# Patient Record
Sex: Male | Born: 1945 | Race: White | Hispanic: No | Smoking: Current every day smoker
Health system: Southern US, Community
[De-identification: ages and names within clinical notes are randomized; demographics above are authoritative.]

## PROBLEM LIST (undated history)

## (undated) ENCOUNTER — Emergency Department (HOSPITAL_BASED_OUTPATIENT_CLINIC_OR_DEPARTMENT_OTHER)

## (undated) DIAGNOSIS — R7303 Prediabetes: Secondary | ICD-10-CM

## (undated) DIAGNOSIS — M1990 Unspecified osteoarthritis, unspecified site: Secondary | ICD-10-CM

## (undated) DIAGNOSIS — G47419 Narcolepsy without cataplexy: Secondary | ICD-10-CM

## (undated) DIAGNOSIS — G47411 Narcolepsy with cataplexy: Secondary | ICD-10-CM

## (undated) DIAGNOSIS — I10 Essential (primary) hypertension: Secondary | ICD-10-CM

## (undated) HISTORY — PX: MOUTH SURGERY: SHX715

---

## 2017-01-06 DIAGNOSIS — I10 Essential (primary) hypertension: Secondary | ICD-10-CM | POA: Diagnosis present

## 2017-01-06 DIAGNOSIS — K219 Gastro-esophageal reflux disease without esophagitis: Secondary | ICD-10-CM | POA: Diagnosis present

## 2017-01-06 DIAGNOSIS — E782 Mixed hyperlipidemia: Secondary | ICD-10-CM | POA: Diagnosis present

## 2017-01-06 DIAGNOSIS — G47411 Narcolepsy with cataplexy: Secondary | ICD-10-CM | POA: Diagnosis present

## 2017-01-12 DIAGNOSIS — J432 Centrilobular emphysema: Secondary | ICD-10-CM | POA: Diagnosis present

## 2017-07-06 ENCOUNTER — Encounter: Payer: Self-pay | Admitting: Neurology

## 2017-12-30 ENCOUNTER — Other Ambulatory Visit (HOSPITAL_COMMUNITY): Payer: Self-pay

## 2018-01-11 NOTE — H&P (Signed)
TOTAL KNEE ADMISSION H&P  Patient is being admitted for left total knee arthroplasty.  Subjective:  Chief Complaint:   Left knee primary OA / pain  HPI: Patrick Lam, 72 y.o. male, has a history of pain and functional disability in the left knee due to arthritis and has failed non-surgical conservative treatments for greater than 12 weeks to include NSAID's and/or analgesics, corticosteriod injections, viscosupplementation injections and activity modification.  Onset of symptoms was gradual, starting 3-4 years ago with gradually worsening course since that time. The patient noted no past surgery on the left knee(s).  Patient currently rates pain in the left knee(s) at 6 out of 10 with activity. Patient has night pain, worsening of pain with activity and weight bearing, pain that interferes with activities of daily living, pain with passive range of motion, crepitus and joint swelling.  Patient has evidence of periarticular osteophytes and joint space narrowing by imaging studies. There is no active infection.  Risks, benefits and expectations were discussed with the patient.  Risks including but not limited to the risk of anesthesia, blood clots, nerve damage, blood vessel damage, failure of the prosthesis, infection and up to and including death.  Patient understand the risks, benefits and expectations and wishes to proceed with surgery.   PCP: Herma Carson, MD  D/C Plans:       Home   Post-op Meds:       No Rx given   Tranexamic Acid:      To be given - IV   Decadron:      Is to be given  FYI:      ASA  Norco  DME:   Pt already has equipment  PT:   [facility]   Past Medical History:  Diagnosis Date  . Arthritis   . Hypertension   . Narcolepsy and cataplexy   . Pre-diabetes     Past Surgical History:  Procedure Laterality Date  . MOUTH SURGERY      No current facility-administered medications for this encounter.    Current Outpatient Medications  Medication Sig Dispense  Refill Last Dose  . clomiPRAMINE (ANAFRANIL) 25 MG capsule Take 25 mg by mouth 3 (three) times daily.      Marland Kitchen diltiazem (DILACOR XR) 240 MG 24 hr capsule Take 240 mg by mouth daily.     . hydrochlorothiazide (HYDRODIURIL) 25 MG tablet Take 25 mg by mouth daily.     Marland Kitchen losartan (COZAAR) 25 MG tablet Take 25 mg by mouth daily.     . metFORMIN (GLUCOPHAGE-XR) 500 MG 24 hr tablet Take 500 mg by mouth daily with breakfast.     . methylphenidate (RITALIN) 20 MG tablet Take 20 mg by mouth daily as needed (narcolepsy).     Marland Kitchen omeprazole (PRILOSEC) 40 MG capsule Take 40 mg by mouth daily.     . rosuvastatin (CRESTOR) 10 MG tablet Take 10 mg by mouth daily.     . Sodium Oxybate (XYREM) 500 MG/ML SOLN Take 4.5 g by mouth 2 (two) times daily. During the night      No Known Allergies   Social History   Tobacco Use  . Smoking status: Current Every Day Smoker    Packs/day: 0.00    Years: 15.00    Pack years: 0.00    Types: Cigars  . Smokeless tobacco: Never Used  . Tobacco comment: 2 cigars a day  Substance Use Topics  . Alcohol use: Never    Frequency: Never  Review of Systems  Constitutional: Negative.   HENT: Negative.   Eyes: Negative.   Respiratory: Negative.   Cardiovascular: Negative.   Gastrointestinal: Positive for heartburn.  Genitourinary: Positive for frequency.  Musculoskeletal: Positive for joint pain.  Skin: Negative.   Neurological: Negative.   Endo/Heme/Allergies: Negative.   Psychiatric/Behavioral: Negative.     Objective:  Physical Exam  Constitutional: He is oriented to person, place, and time. He appears well-developed.  HENT:  Head: Normocephalic.  Eyes: Pupils are equal, round, and reactive to light.  Neck: Neck supple. No JVD present. No tracheal deviation present. No thyromegaly present.  Cardiovascular: Normal rate, regular rhythm and intact distal pulses.  Respiratory: Effort normal and breath sounds normal. No respiratory distress. He has no  wheezes.  GI: Soft. There is no tenderness. There is no guarding.  Musculoskeletal:       Left knee: He exhibits decreased range of motion, swelling and bony tenderness. He exhibits no ecchymosis, no deformity, no laceration and no erythema. Tenderness found.  Lymphadenopathy:    He has no cervical adenopathy.  Neurological: He is alert and oriented to person, place, and time.  Skin: Skin is warm and dry.  Psychiatric: He has a normal mood and affect.      Imaging Review Plain radiographs demonstrate severe degenerative joint disease of the left knee.  The bone quality appears to be good for age and reported activity level.   Preoperative templating of the joint replacement has been completed, documented, and submitted to the Operating Room personnel in order to optimize intra-operative equipment management.   Anticipated LOS equal to or greater than 2 midnights due to - Age 74 and older with one or more of the following:  - Obesity  - Expected need for hospital services (PT, OT, Nursing) required for safe  discharge  - Active co-morbidities: narcolepsy    Assessment/Plan:  End stage arthritis, left knee   The patient history, physical examination, clinical judgment of the provider and imaging studies are consistent with end stage degenerative joint disease of the left knee(s) and total knee arthroplasty is deemed medically necessary. The treatment options including medical management, injection therapy arthroscopy and arthroplasty were discussed at length. The risks and benefits of total knee arthroplasty were presented and reviewed. The risks due to aseptic loosening, infection, stiffness, patella tracking problems, thromboembolic complications and other imponderables were discussed. The patient acknowledged the explanation, agreed to proceed with the plan and consent was signed. Patient is being admitted for inpatient treatment for surgery, pain control, PT, OT, prophylactic  antibiotics, VTE prophylaxis, progressive ambulation and ADL's and discharge planning. The patient is planning to be discharged home.   Anastasio AuerbachMatthew S. Sarh Kirschenbaum   PA-C  01/27/2018, 9:35 AM

## 2018-01-13 NOTE — Patient Instructions (Addendum)
Stokes Rattigan  01/13/2018   Your procedure is scheduled on: Tuesday 02/02/2018  Report to [facility] Main  Entrance              Report to admitting at  1150 AM    Call this number if you have problems the morning of surgery [phone]    Remember: Do not eat food :After Midnight.May have clear liquids from midnight up until 0820 am then nothing until after surgery!     CLEAR LIQUID DIET   Foods Allowed                                                                     Foods Excluded  Coffee and tea, regular and decaf                             liquids that you cannot  Plain Jell-O in any flavor                                             see through such as: Fruit ices (not with fruit pulp)                                     milk, soups, orange juice  Iced Popsicles                                    All solid food Carbonated beverages, regular and diet                                    Cranberry, grape and apple juices Sports drinks like Gatorade Lightly seasoned clear broth or consume(fat free) Sugar, honey syrup  Sample Menu Breakfast                                Lunch                                     Supper Cranberry juice                    Beef broth                            Chicken broth Jell-O                                     Grape juice  Apple juice Coffee or tea                        Jell-O                                      Popsicle                                                Coffee or tea                        Coffee or tea  _____________________________________________________________________               BRUSH YOUR TEETH MORNING OF SURGERY AND RINSE YOUR MOUTH OUT, NO CHEWING GUM CANDY OR MINTS.  How to Manage Your Diabetes Before and After Surgery  Why is it important to control my blood sugar before and after surgery? . Improving blood sugar levels before and after surgery helps  healing and can limit problems. . A way of improving blood sugar control is eating a healthy diet by: o  Eating less sugar and carbohydrates o  Increasing activity/exercise o  Talking with your doctor about reaching your blood sugar goals . High blood sugars (greater than 180 mg/dL) can raise your risk of infections and slow your recovery, so you will need to focus on controlling your diabetes during the weeks before surgery. . Make sure that the doctor who takes care of your diabetes knows about your planned surgery including the date and location.  How do I manage my blood sugar before surgery? . Check your blood sugar at least 4 times a day, starting 2 days before surgery, to make sure that the level is not too high or low. o Check your blood sugar the morning of your surgery when you wake up and every 2 hours until you get to the Short Stay unit. . If your blood sugar is less than 70 mg/dL, you will need to treat for low blood sugar: o Do not take insulin. o Treat a low blood sugar (less than 70 mg/dL) with  cup of clear juice (cranberry or apple), 4 glucose tablets, OR glucose gel. o Recheck blood sugar in 15 minutes after treatment (to make sure it is greater than 70 mg/dL). If your blood sugar is not greater than 70 mg/dL on recheck, call [phone] for further instructions. . Report your blood sugar to the short stay nurse when you get to Short Stay.  . If you are admitted to the hospital after surgery: o Your blood sugar will be checked by the staff and you will probably be given insulin after surgery (instead of oral diabetes medicines) to make sure you have good blood sugar levels. o The goal for blood sugar control after surgery is 80-180 mg/dL.   WHAT DO I DO ABOUT MY DIABETES MEDICATION?       The day before surgery, take Metformin as usual.  . Do not take oral diabetes medicines (pills) the morning of surgery.        Take these medicines the morning of surgery with A  SIP OF WATER: Diltiazem (Dilacor XR), Omeprazole (Prilosec), Rosuvastatin (Crestor), Clomipramine (Anaframil)  You may not have any metal on your body including hair pins and              piercings  Do not wear jewelry, make-up, lotions, powders or perfumes, deodorant                      Men may shave face and neck.   Do not bring valuables to the hospital.  IS NOT             RESPONSIBLE   FOR VALUABLES.  Contacts, dentures or bridgework may not be worn into surgery.  Leave suitcase in the car. After surgery it may be brought to your room.                  Please read over the following fact sheets you were given: _____________________________________________________________________             [facility] - Preparing for Surgery Before surgery, you can play an important role.  Because skin is not sterile, your skin needs to be as free of germs as possible.  You can reduce the number of germs on your skin by washing with CHG (chlorahexidine gluconate) soap before surgery.  CHG is an antiseptic cleaner which kills germs and bonds with the skin to continue killing germs even after washing. Please DO NOT use if you have an allergy to CHG or antibacterial soaps.  If your skin becomes reddened/irritated stop using the CHG and inform your nurse when you arrive at Short Stay. Do not shave (including legs and underarms) for at least 48 hours prior to the first CHG shower.  You may shave your face/neck. Please follow these instructions carefully:  1.  Shower with CHG Soap the night before surgery and the  morning of Surgery.  2.  If you choose to wash your hair, wash your hair first as usual with your  normal  shampoo.  3.  After you shampoo, rinse your hair and body thoroughly to remove the  shampoo.                           4.  Use CHG as you would any other liquid soap.  You can apply chg directly  to the skin and wash                       Gently  with a scrungie or clean washcloth.  5.  Apply the CHG Soap to your body ONLY FROM THE NECK DOWN.   Do not use on face/ open                           Wound or open sores. Avoid contact with eyes, ears mouth and genitals (private parts).                       Wash face,  Genitals (private parts) with your normal soap.             6.  Wash thoroughly, paying special attention to the area where your surgery  will be performed.  7.  Thoroughly rinse your body with warm water from the neck down.  8.  DO NOT shower/wash with your normal soap after using and rinsing off  the CHG Soap.  9.  Pat yourself dry with a clean towel.            10.  Wear clean pajamas.            11.  Place clean sheets on your bed the night of your first shower and do not  sleep with pets. Day of Surgery : Do not apply any lotions/deodorants the morning of surgery.  Please wear clean clothes to the hospital/surgery center.  FAILURE TO FOLLOW THESE INSTRUCTIONS MAY RESULT IN THE CANCELLATION OF YOUR SURGERY PATIENT SIGNATURE_________________________________  NURSE SIGNATURE__________________________________  ________________________________________________________________________   Adam Phenix  An incentive spirometer is a tool that can help keep your lungs clear and active. This tool measures how well you are filling your lungs with each breath. Taking long deep breaths may help reverse or decrease the chance of developing breathing (pulmonary) problems (especially infection) following:  A long period of time when you are unable to move or be active. BEFORE THE PROCEDURE   If the spirometer includes an indicator to show your best effort, your nurse or respiratory therapist will set it to a desired goal.  If possible, sit up straight or lean slightly forward. Try not to slouch.  Hold the incentive spirometer in an upright position. INSTRUCTIONS FOR USE  1. Sit on the edge of your bed if  possible, or sit up as far as you can in bed or on a chair. 2. Hold the incentive spirometer in an upright position. 3. Breathe out normally. 4. Place the mouthpiece in your mouth and seal your lips tightly around it. 5. Breathe in slowly and as deeply as possible, raising the piston or the ball toward the top of the column. 6. Hold your breath for 3-5 seconds or for as long as possible. Allow the piston or ball to fall to the bottom of the column. 7. Remove the mouthpiece from your mouth and breathe out normally. 8. Rest for a few seconds and repeat Steps 1 through 7 at least 10 times every 1-2 hours when you are awake. Take your time and take a few normal breaths between deep breaths. 9. The spirometer may include an indicator to show your best effort. Use the indicator as a goal to work toward during each repetition. 10. After each set of 10 deep breaths, practice coughing to be sure your lungs are clear. If you have an incision (the cut made at the time of surgery), support your incision when coughing by placing a pillow or rolled up towels firmly against it. Once you are able to get out of bed, walk around indoors and cough well. You may stop using the incentive spirometer when instructed by your caregiver.  RISKS AND COMPLICATIONS  Take your time so you do not get dizzy or light-headed.  If you are in pain, you may need to take or ask for pain medication before doing incentive spirometry. It is harder to take a deep breath if you are having pain. AFTER USE  Rest and breathe slowly and easily.  It can be helpful to keep track of a log of your progress. Your caregiver can provide you with a simple table to help with this. If you are using the spirometer at home, follow these instructions: [place] IF:   You are having difficultly using the spirometer.  You have trouble using the spirometer as often as instructed.  Your pain medication is not giving enough relief while using  the spirometer.  You  develop fever of 100.5 F (38.1 C) or higher. SEEK IMMEDIATE MEDICAL CARE IF:   You cough up bloody sputum that had not been present before.  You develop fever of 102 F (38.9 C) or greater.  You develop worsening pain at or near the incision site. MAKE SURE YOU:   Understand these instructions.  Will watch your condition.  Will get help right away if you are not doing well or get worse. Document Released: 06/16/2006 Document Revised: 04/28/2011 Document Reviewed: 08/17/2006 [employer] Patient Information 2014 [employer], [place].   ________________________________________________________________________  WHAT IS A BLOOD TRANSFUSION? Blood Transfusion Information  A transfusion is the replacement of blood or some of its parts. Blood is made up of multiple cells which provide different functions.  Red blood cells carry oxygen and are used for blood loss replacement.  White blood cells fight against infection.  Platelets control bleeding.  Plasma helps clot blood.  Other blood products are available for specialized needs, such as hemophilia or other clotting disorders. BEFORE THE TRANSFUSION  Who gives blood for transfusions?   Healthy volunteers who are fully evaluated to make sure their blood is safe. This is blood bank blood. Transfusion therapy is the safest it has ever been in the practice of medicine. Before blood is taken from a donor, a complete history is taken to make sure that person has no history of diseases nor engages in risky social behavior (examples are intravenous drug use or sexual activity with multiple partners). The donor's travel history is screened to minimize risk of transmitting infections, such as malaria. The donated blood is tested for signs of infectious diseases, such as HIV and hepatitis. The blood is then tested to be sure it is compatible with you in order to minimize the chance of a transfusion reaction. If you or a relative  donates blood, this is often done in anticipation of surgery and is not appropriate for emergency situations. It takes many days to process the donated blood. RISKS AND COMPLICATIONS Although transfusion therapy is very safe and saves many lives, the main dangers of transfusion include:   Getting an infectious disease.  Developing a transfusion reaction. This is an allergic reaction to something in the blood you were given. Every precaution is taken to prevent this. The decision to have a blood transfusion has been considered carefully by your caregiver before blood is given. Blood is not given unless the benefits outweigh the risks. AFTER THE TRANSFUSION  Right after receiving a blood transfusion, you will usually feel much better and more energetic. This is especially true if your red blood cells have gotten low (anemic). The transfusion raises the level of the red blood cells which carry oxygen, and this usually causes an energy increase.  The nurse administering the transfusion will monitor you carefully for complications. HOME CARE INSTRUCTIONS  No special instructions are needed after a transfusion. You may find your energy is better. Speak with your caregiver about any limitations on activity for underlying diseases you may have. SEEK MEDICAL CARE IF:   Your condition is not improving after your transfusion.  You develop redness or irritation at the intravenous (IV) site. SEEK IMMEDIATE MEDICAL CARE IF:  Any of the following symptoms occur over the next 12 hours:  Shaking chills.  You have a temperature by mouth above 102 F (38.9 C), not controlled by medicine.  Chest, back, or muscle pain.  People around you feel you are not acting correctly or are confused.  Shortness of breath  or difficulty breathing.  Dizziness and fainting.  You get a rash or develop hives.  You have a decrease in urine output.  Your urine turns a dark color or changes to pink, red, or brown. Any  of the following symptoms occur over the next 10 days:  You have a temperature by mouth above 102 F (38.9 C), not controlled by medicine.  Shortness of breath.  Weakness after normal activity.  The white part of the eye turns yellow (jaundice).  You have a decrease in the amount of urine or are urinating less often.  Your urine turns a dark color or changes to pink, red, or brown. Document Released: 02/01/2000 Document Revised: 04/28/2011 Document Reviewed: 09/20/2007 [facility] Patient Information 2014 [place], [place].  _______________________________________________________________________

## 2018-01-26 ENCOUNTER — Encounter (HOSPITAL_COMMUNITY): Payer: Self-pay

## 2018-01-26 ENCOUNTER — Other Ambulatory Visit: Payer: Self-pay

## 2018-01-26 ENCOUNTER — Encounter (HOSPITAL_COMMUNITY)
Admission: RE | Admit: 2018-01-26 | Discharge: 2018-01-26 | Disposition: A | Discharge status: Home / Self Care | Payer: Medicare Other | Source: Ambulatory Visit | Attending: Orthopedic Surgery | Admitting: Orthopedic Surgery

## 2018-01-26 DIAGNOSIS — Z01818 Encounter for other preprocedural examination: Secondary | ICD-10-CM | POA: Insufficient documentation

## 2018-01-26 DIAGNOSIS — I10 Essential (primary) hypertension: Secondary | ICD-10-CM | POA: Diagnosis not present

## 2018-01-26 DIAGNOSIS — M1712 Unilateral primary osteoarthritis, left knee: Secondary | ICD-10-CM | POA: Diagnosis not present

## 2018-01-26 HISTORY — DX: Unspecified osteoarthritis, unspecified site: M19.90

## 2018-01-26 HISTORY — DX: Prediabetes: R73.03

## 2018-01-26 HISTORY — DX: Narcolepsy with cataplexy: G47.411

## 2018-01-26 HISTORY — DX: Essential (primary) hypertension: I10

## 2018-01-26 LAB — BASIC METABOLIC PANEL
Anion gap: 8 (ref 5–15)
BUN: 16 mg/dL (ref 8–23)
CO2: 31 mmol/L (ref 22–32)
Calcium: 9.4 mg/dL (ref 8.9–10.3)
Chloride: 102 mmol/L (ref 98–111)
Creatinine, Ser: 1.05 mg/dL (ref 0.61–1.24)
GFR calc Af Amer: >60 mL/min (ref >60)
GFR calc non Af Amer: >60 mL/min (ref >60)
GLUCOSE: 87 mg/dL (ref 70–99)
Potassium: 4.4 mmol/L (ref 3.5–5.1)
Sodium: 141 mmol/L (ref 135–145)

## 2018-01-26 LAB — CBC
HCT: 51.4 % (ref 39.0–52.0)
Hemoglobin: 15.9 g/dL (ref 13.0–17.0)
MCH: 23.9 pg — AB (ref 26.0–34.0)
MCHC: 30.9 g/dL (ref 30.0–36.0)
MCV: 77.2 fL — ABNORMAL LOW (ref 80.0–100.0)
Platelets: 310 10*3/uL (ref 150–400)
RBC: 6.66 MIL/uL — ABNORMAL HIGH (ref 4.22–5.81)
RDW: 17.6 % — ABNORMAL HIGH (ref 11.5–15.5)
WBC: 9.1 10*3/uL (ref 4.0–10.5)
nRBC: 0 % (ref 0.0–0.2)

## 2018-01-26 LAB — SURGICAL PCR SCREEN
MRSA, PCR: NEGATIVE
Staphylococcus aureus: NEGATIVE

## 2018-01-26 LAB — GLUCOSE, CAPILLARY: GLUCOSE-CAPILLARY: 90 mg/dL (ref 70–99)

## 2018-01-26 LAB — HEMOGLOBIN A1C
Hgb A1c MFr Bld: 6 % — ABNORMAL HIGH (ref 4.8–5.6)
Mean Plasma Glucose: 125.5 mg/dL

## 2018-01-27 LAB — ABO/RH: ABO/RH(D): A POS

## 2018-01-29 NOTE — Progress Notes (Signed)
01/27/2018- Medical Clearance from Dr. Nehemiah MassedHeidi Mandy on chart  01/08/2018- Lab results from Dr. Trudie BucklerHeidi Mandy-U/A  Urine albumin, CBC w/ diff.,, BMP,HgA1C, and office note on chart

## 2018-02-02 ENCOUNTER — Inpatient Hospital Stay (HOSPITAL_COMMUNITY): Payer: Medicare Other | Admitting: Certified Registered"

## 2018-02-02 ENCOUNTER — Inpatient Hospital Stay (HOSPITAL_COMMUNITY)
Admission: RE | Admit: 2018-02-02 | Discharge: 2018-02-04 | DRG: 470 | Disposition: A | Discharge status: Home / Self Care | Payer: Medicare Other | Attending: Orthopedic Surgery | Admitting: Orthopedic Surgery

## 2018-02-02 ENCOUNTER — Encounter (HOSPITAL_COMMUNITY)
Admission: RE | Disposition: A | Discharge status: Home / Self Care | Payer: Self-pay | Source: Home / Self Care | Attending: Orthopedic Surgery

## 2018-02-02 ENCOUNTER — Encounter (HOSPITAL_COMMUNITY): Payer: Self-pay | Admitting: Emergency Medicine

## 2018-02-02 DIAGNOSIS — G47411 Narcolepsy with cataplexy: Secondary | ICD-10-CM | POA: Diagnosis present

## 2018-02-02 DIAGNOSIS — Z96652 Presence of left artificial knee joint: Secondary | ICD-10-CM

## 2018-02-02 DIAGNOSIS — I10 Essential (primary) hypertension: Secondary | ICD-10-CM | POA: Diagnosis present

## 2018-02-02 DIAGNOSIS — M25462 Effusion, left knee: Secondary | ICD-10-CM | POA: Diagnosis present

## 2018-02-02 DIAGNOSIS — Z7984 Long term (current) use of oral hypoglycemic drugs: Secondary | ICD-10-CM

## 2018-02-02 DIAGNOSIS — Z79899 Other long term (current) drug therapy: Secondary | ICD-10-CM

## 2018-02-02 DIAGNOSIS — F17210 Nicotine dependence, cigarettes, uncomplicated: Secondary | ICD-10-CM | POA: Diagnosis present

## 2018-02-02 DIAGNOSIS — E663 Overweight: Secondary | ICD-10-CM | POA: Diagnosis present

## 2018-02-02 DIAGNOSIS — M1712 Unilateral primary osteoarthritis, left knee: Principal | ICD-10-CM | POA: Diagnosis present

## 2018-02-02 DIAGNOSIS — Z6825 Body mass index (BMI) 25.0-25.9, adult: Secondary | ICD-10-CM

## 2018-02-02 DIAGNOSIS — M659 Synovitis and tenosynovitis, unspecified: Secondary | ICD-10-CM | POA: Diagnosis present

## 2018-02-02 DIAGNOSIS — R7303 Prediabetes: Secondary | ICD-10-CM | POA: Diagnosis present

## 2018-02-02 HISTORY — PX: TOTAL KNEE ARTHROPLASTY: SHX125

## 2018-02-02 LAB — GLUCOSE, CAPILLARY
Glucose-Capillary: 105 mg/dL — ABNORMAL HIGH (ref 70–99)
Glucose-Capillary: 126 mg/dL — ABNORMAL HIGH (ref 70–99)

## 2018-02-02 LAB — TYPE AND SCREEN
ABO/RH(D): A POS
Antibody Screen: NEGATIVE

## 2018-02-02 SURGERY — ARTHROPLASTY, KNEE, TOTAL
Anesthesia: Spinal | Laterality: Left

## 2018-02-02 MED ORDER — MENTHOL 3 MG MT LOZG: 1.0000 | LOZENGE | OROMUCOSAL | Status: DC | PRN

## 2018-02-02 MED ORDER — MIDAZOLAM HCL 2 MG/2ML IJ SOLN
1.0000 mg | INTRAMUSCULAR | Status: DC
  Filled 2018-02-02: qty 2

## 2018-02-02 MED ORDER — METHOCARBAMOL 500 MG IVPB - SIMPLE MED
500.0000 mg | Freq: Four times a day (QID) | INTRAVENOUS | Status: DC | PRN
  Filled 2018-02-02: qty 50

## 2018-02-02 MED ORDER — MORPHINE SULFATE (PF) 2 MG/ML IV SOLN: 0.5000 mg | INTRAVENOUS | Status: DC | PRN

## 2018-02-02 MED ORDER — ONDANSETRON HCL 4 MG/2ML IJ SOLN
INTRAMUSCULAR | Status: DC | PRN
  Administered 2018-02-02: 4 mg via INTRAVENOUS

## 2018-02-02 MED ORDER — ROPIVACAINE HCL 5 MG/ML IJ SOLN
INTRAMUSCULAR | Status: DC | PRN
  Administered 2018-02-02: 30 mL via EPIDURAL

## 2018-02-02 MED ORDER — DOCUSATE SODIUM 100 MG PO CAPS
100.0000 mg | ORAL_CAPSULE | Freq: Two times a day (BID) | ORAL | Status: DC
  Administered 2018-02-02 – 2018-02-04 (×4): 100 mg via ORAL
  Filled 2018-02-02 (×4): qty 1

## 2018-02-02 MED ORDER — FERROUS SULFATE 325 (65 FE) MG PO TABS: 325.0000 mg | ORAL_TABLET | Freq: Three times a day (TID) | ORAL | 3 refills | Status: DC

## 2018-02-02 MED ORDER — HYDROCODONE-ACETAMINOPHEN 5-325 MG PO TABS
1.0000 | ORAL_TABLET | ORAL | Status: DC | PRN
  Administered 2018-02-02 – 2018-02-03 (×3): 1 via ORAL
  Administered 2018-02-03: 2 via ORAL
  Filled 2018-02-02: qty 1
  Filled 2018-02-02: qty 2
  Filled 2018-02-02 (×2): qty 1

## 2018-02-02 MED ORDER — HYDROCODONE-ACETAMINOPHEN 7.5-325 MG PO TABS
1.0000 | ORAL_TABLET | ORAL | Status: DC | PRN
  Administered 2018-02-03 – 2018-02-04 (×2): 2 via ORAL
  Filled 2018-02-02 (×2): qty 2

## 2018-02-02 MED ORDER — FERROUS SULFATE 325 (65 FE) MG PO TABS
325.0000 mg | ORAL_TABLET | Freq: Two times a day (BID) | ORAL | Status: DC
  Administered 2018-02-02 – 2018-02-04 (×4): 325 mg via ORAL
  Filled 2018-02-02 (×4): qty 1

## 2018-02-02 MED ORDER — SODIUM CHLORIDE 0.9 % IV SOLN
INTRAVENOUS | Status: DC
  Administered 2018-02-02 – 2018-02-03 (×2): via INTRAVENOUS

## 2018-02-02 MED ORDER — DILTIAZEM HCL ER 240 MG PO CP24
240.0000 mg | ORAL_CAPSULE | Freq: Every day | ORAL | Status: DC
  Filled 2018-02-02: qty 1

## 2018-02-02 MED ORDER — MAGNESIUM CITRATE PO SOLN: 1.0000 | Freq: Once | ORAL | Status: DC | PRN

## 2018-02-02 MED ORDER — METOCLOPRAMIDE HCL 5 MG/ML IJ SOLN: 10.0000 mg | Freq: Once | INTRAMUSCULAR | Status: DC | PRN

## 2018-02-02 MED ORDER — HYDROCODONE-ACETAMINOPHEN 7.5-325 MG PO TABS: 1.0000 | ORAL_TABLET | ORAL | 0 refills | Status: DC | PRN

## 2018-02-02 MED ORDER — HYDROCHLOROTHIAZIDE 25 MG PO TABS
25.0000 mg | ORAL_TABLET | Freq: Every day | ORAL | Status: DC
  Administered 2018-02-03 – 2018-02-04 (×2): 25 mg via ORAL
  Filled 2018-02-02 (×3): qty 1

## 2018-02-02 MED ORDER — PROPOFOL 500 MG/50ML IV EMUL
INTRAVENOUS | Status: DC | PRN
  Administered 2018-02-02: 100 ug/kg/min via INTRAVENOUS

## 2018-02-02 MED ORDER — POLYETHYLENE GLYCOL 3350 17 G PO PACK: 17.0000 g | PACK | Freq: Two times a day (BID) | ORAL | 0 refills | Status: DC

## 2018-02-02 MED ORDER — METFORMIN HCL ER 500 MG PO TB24
500.0000 mg | ORAL_TABLET | Freq: Every day | ORAL | Status: DC
  Administered 2018-02-03 – 2018-02-04 (×2): 500 mg via ORAL
  Filled 2018-02-02 (×2): qty 1

## 2018-02-02 MED ORDER — PHENYLEPHRINE 40 MCG/ML (10ML) SYRINGE FOR IV PUSH (FOR BLOOD PRESSURE SUPPORT)
PREFILLED_SYRINGE | INTRAVENOUS | Status: AC
  Filled 2018-02-02: qty 10

## 2018-02-02 MED ORDER — PROPOFOL 10 MG/ML IV BOLUS
INTRAVENOUS | Status: AC
  Filled 2018-02-02: qty 20

## 2018-02-02 MED ORDER — BUPIVACAINE HCL (PF) 0.25 % IJ SOLN
INTRAMUSCULAR | Status: AC
  Filled 2018-02-02: qty 30

## 2018-02-02 MED ORDER — DEXAMETHASONE SODIUM PHOSPHATE 10 MG/ML IJ SOLN
10.0000 mg | Freq: Once | INTRAMUSCULAR | Status: AC
  Administered 2018-02-02: 10 mg via INTRAVENOUS

## 2018-02-02 MED ORDER — SODIUM CHLORIDE (PF) 0.9 % IJ SOLN
INTRAMUSCULAR | Status: DC | PRN
  Administered 2018-02-02: 30 mL

## 2018-02-02 MED ORDER — CLOMIPRAMINE HCL 25 MG PO CAPS
25.0000 mg | ORAL_CAPSULE | Freq: Three times a day (TID) | ORAL | Status: DC
  Administered 2018-02-02 – 2018-02-03 (×4): 25 mg via ORAL
  Filled 2018-02-02 (×6): qty 1

## 2018-02-02 MED ORDER — TRANEXAMIC ACID-NACL 1000-0.7 MG/100ML-% IV SOLN
1000.0000 mg | Freq: Once | INTRAVENOUS | Status: AC
  Administered 2018-02-02: 1000 mg via INTRAVENOUS
  Filled 2018-02-02: qty 100

## 2018-02-02 MED ORDER — LOSARTAN POTASSIUM 25 MG PO TABS
25.0000 mg | ORAL_TABLET | Freq: Every day | ORAL | Status: DC
  Administered 2018-02-03 – 2018-02-04 (×2): 25 mg via ORAL
  Filled 2018-02-02 (×2): qty 1

## 2018-02-02 MED ORDER — MEPERIDINE HCL 50 MG/ML IJ SOLN: 6.2500 mg | INTRAMUSCULAR | Status: DC | PRN

## 2018-02-02 MED ORDER — PHENOL 1.4 % MT LIQD
1.0000 | OROMUCOSAL | Status: DC | PRN
  Filled 2018-02-02: qty 177

## 2018-02-02 MED ORDER — CELECOXIB 200 MG PO CAPS
200.0000 mg | ORAL_CAPSULE | Freq: Two times a day (BID) | ORAL | Status: DC
  Administered 2018-02-02 – 2018-02-04 (×4): 200 mg via ORAL
  Filled 2018-02-02 (×4): qty 1

## 2018-02-02 MED ORDER — PHENYLEPHRINE 40 MCG/ML (10ML) SYRINGE FOR IV PUSH (FOR BLOOD PRESSURE SUPPORT)
PREFILLED_SYRINGE | INTRAVENOUS | Status: DC | PRN
  Administered 2018-02-02 (×2): 120 ug via INTRAVENOUS

## 2018-02-02 MED ORDER — DEXAMETHASONE SODIUM PHOSPHATE 10 MG/ML IJ SOLN
10.0000 mg | Freq: Once | INTRAMUSCULAR | Status: AC
  Administered 2018-02-03: 10 mg via INTRAVENOUS
  Filled 2018-02-02: qty 1

## 2018-02-02 MED ORDER — SODIUM CHLORIDE 0.9 % IV SOLN
INTRAVENOUS | Status: DC | PRN
  Administered 2018-02-02: 25 ug/min via INTRAVENOUS

## 2018-02-02 MED ORDER — ONDANSETRON HCL 4 MG/2ML IJ SOLN
INTRAMUSCULAR | Status: AC
  Filled 2018-02-02: qty 2

## 2018-02-02 MED ORDER — CLONIDINE HCL (ANALGESIA) 100 MCG/ML EP SOLN
EPIDURAL | Status: DC | PRN
  Administered 2018-02-02: 100 ug

## 2018-02-02 MED ORDER — DOCUSATE SODIUM 100 MG PO CAPS: 100.0000 mg | ORAL_CAPSULE | Freq: Two times a day (BID) | ORAL | 0 refills | Status: DC

## 2018-02-02 MED ORDER — ONDANSETRON HCL 4 MG PO TABS: 4.0000 mg | ORAL_TABLET | Freq: Four times a day (QID) | ORAL | Status: DC | PRN

## 2018-02-02 MED ORDER — DIPHENHYDRAMINE HCL 12.5 MG/5ML PO ELIX: 12.5000 mg | ORAL_SOLUTION | ORAL | Status: DC | PRN

## 2018-02-02 MED ORDER — NON FORMULARY: 40.0000 mg | Freq: Every day | Status: DC

## 2018-02-02 MED ORDER — ASPIRIN 81 MG PO CHEW
81.0000 mg | CHEWABLE_TABLET | Freq: Two times a day (BID) | ORAL | Status: DC
  Administered 2018-02-02 – 2018-02-04 (×4): 81 mg via ORAL
  Filled 2018-02-02 (×4): qty 1

## 2018-02-02 MED ORDER — PROPOFOL 10 MG/ML IV BOLUS
INTRAVENOUS | Status: DC | PRN
  Administered 2018-02-02 (×2): 20 mg via INTRAVENOUS

## 2018-02-02 MED ORDER — BUPIVACAINE HCL (PF) 0.25 % IJ SOLN
INTRAMUSCULAR | Status: DC | PRN
  Administered 2018-02-02: 30 mL

## 2018-02-02 MED ORDER — ROSUVASTATIN CALCIUM 10 MG PO TABS
10.0000 mg | ORAL_TABLET | Freq: Every day | ORAL | Status: DC
  Administered 2018-02-03 – 2018-02-04 (×2): 10 mg via ORAL
  Filled 2018-02-02 (×2): qty 1

## 2018-02-02 MED ORDER — METHOCARBAMOL 500 MG PO TABS
500.0000 mg | ORAL_TABLET | Freq: Four times a day (QID) | ORAL | Status: DC | PRN
  Administered 2018-02-03 – 2018-02-04 (×2): 500 mg via ORAL
  Filled 2018-02-02 (×3): qty 1

## 2018-02-02 MED ORDER — FENTANYL CITRATE (PF) 100 MCG/2ML IJ SOLN
50.0000 ug | INTRAMUSCULAR | Status: DC
  Administered 2018-02-02: 100 ug via INTRAVENOUS
  Filled 2018-02-02: qty 2

## 2018-02-02 MED ORDER — CEFAZOLIN SODIUM-DEXTROSE 2-4 GM/100ML-% IV SOLN
2.0000 g | INTRAVENOUS | Status: AC
  Administered 2018-02-02: 2 g via INTRAVENOUS
  Filled 2018-02-02: qty 100

## 2018-02-02 MED ORDER — SODIUM OXYBATE 500 MG/ML PO SOLN: 4500.0000 mg | Freq: Two times a day (BID) | ORAL | Status: DC

## 2018-02-02 MED ORDER — FENTANYL CITRATE (PF) 100 MCG/2ML IJ SOLN: 25.0000 ug | INTRAMUSCULAR | Status: DC | PRN

## 2018-02-02 MED ORDER — PROPOFOL 10 MG/ML IV BOLUS
INTRAVENOUS | Status: AC
  Filled 2018-02-02: qty 80

## 2018-02-02 MED ORDER — ACETAMINOPHEN 325 MG PO TABS
325.0000 mg | ORAL_TABLET | Freq: Four times a day (QID) | ORAL | Status: DC | PRN
  Administered 2018-02-03: 650 mg via ORAL
  Filled 2018-02-02: qty 2

## 2018-02-02 MED ORDER — METOCLOPRAMIDE HCL 5 MG/ML IJ SOLN: 5.0000 mg | Freq: Three times a day (TID) | INTRAMUSCULAR | Status: DC | PRN

## 2018-02-02 MED ORDER — ASPIRIN 81 MG PO CHEW: 81.0000 mg | CHEWABLE_TABLET | Freq: Two times a day (BID) | ORAL | 0 refills | Status: AC

## 2018-02-02 MED ORDER — BUPIVACAINE IN DEXTROSE 0.75-8.25 % IT SOLN
INTRATHECAL | Status: DC | PRN
  Administered 2018-02-02: 1.6 mL via INTRATHECAL

## 2018-02-02 MED ORDER — KETOROLAC TROMETHAMINE 30 MG/ML IJ SOLN
INTRAMUSCULAR | Status: AC
  Filled 2018-02-02: qty 1

## 2018-02-02 MED ORDER — METOCLOPRAMIDE HCL 5 MG PO TABS: 5.0000 mg | ORAL_TABLET | Freq: Three times a day (TID) | ORAL | Status: DC | PRN

## 2018-02-02 MED ORDER — CHLORHEXIDINE GLUCONATE 4 % EX LIQD: 60.0000 mL | Freq: Once | CUTANEOUS | Status: DC

## 2018-02-02 MED ORDER — BISACODYL 10 MG RE SUPP: 10.0000 mg | Freq: Every day | RECTAL | Status: DC | PRN

## 2018-02-02 MED ORDER — INSULIN ASPART 100 UNIT/ML ~~LOC~~ SOLN: 0.0000 [IU] | Freq: Three times a day (TID) | SUBCUTANEOUS | Status: DC

## 2018-02-02 MED ORDER — KETOROLAC TROMETHAMINE 30 MG/ML IJ SOLN
INTRAMUSCULAR | Status: DC | PRN
  Administered 2018-02-02: 30 mg

## 2018-02-02 MED ORDER — PHENYLEPHRINE HCL 10 MG/ML IJ SOLN
INTRAMUSCULAR | Status: AC
  Filled 2018-02-02: qty 2

## 2018-02-02 MED ORDER — METHOCARBAMOL 500 MG PO TABS: 500.0000 mg | ORAL_TABLET | Freq: Four times a day (QID) | ORAL | 0 refills | Status: DC | PRN

## 2018-02-02 MED ORDER — DEXAMETHASONE SODIUM PHOSPHATE 10 MG/ML IJ SOLN
INTRAMUSCULAR | Status: AC
  Filled 2018-02-02: qty 1

## 2018-02-02 MED ORDER — METHYLPHENIDATE HCL 10 MG PO TABS: 20.0000 mg | ORAL_TABLET | Freq: Every day | ORAL | Status: DC | PRN

## 2018-02-02 MED ORDER — POLYETHYLENE GLYCOL 3350 17 G PO PACK
17.0000 g | PACK | Freq: Two times a day (BID) | ORAL | Status: DC
  Administered 2018-02-02 – 2018-02-04 (×4): 17 g via ORAL
  Filled 2018-02-02 (×4): qty 1

## 2018-02-02 MED ORDER — ALUM & MAG HYDROXIDE-SIMETH 200-200-20 MG/5ML PO SUSP: 15.0000 mL | ORAL | Status: DC | PRN

## 2018-02-02 MED ORDER — TRANEXAMIC ACID-NACL 1000-0.7 MG/100ML-% IV SOLN
1000.0000 mg | INTRAVENOUS | Status: AC
  Administered 2018-02-02: 1000 mg via INTRAVENOUS
  Filled 2018-02-02: qty 100

## 2018-02-02 MED ORDER — ONDANSETRON HCL 4 MG/2ML IJ SOLN: 4.0000 mg | Freq: Four times a day (QID) | INTRAMUSCULAR | Status: DC | PRN

## 2018-02-02 MED ORDER — CEFAZOLIN SODIUM-DEXTROSE 2-4 GM/100ML-% IV SOLN
2.0000 g | Freq: Four times a day (QID) | INTRAVENOUS | Status: AC
  Administered 2018-02-02 – 2018-02-03 (×2): 2 g via INTRAVENOUS
  Filled 2018-02-02 (×2): qty 100

## 2018-02-02 MED ORDER — LACTATED RINGERS IV SOLN
INTRAVENOUS | Status: DC
  Administered 2018-02-02: 12:00:00 via INTRAVENOUS

## 2018-02-02 MED ORDER — OMEPRAZOLE 20 MG PO CPDR
40.0000 mg | DELAYED_RELEASE_CAPSULE | Freq: Every day | ORAL | Status: DC
  Administered 2018-02-03 – 2018-02-04 (×2): 40 mg via ORAL
  Filled 2018-02-02 (×2): qty 2

## 2018-02-02 MED ORDER — SODIUM CHLORIDE (PF) 0.9 % IJ SOLN
INTRAMUSCULAR | Status: AC
  Filled 2018-02-02: qty 50

## 2018-02-02 SURGICAL SUPPLY — 58 items
ATTUNE MED ANAT PAT 41 KNEE (Knees) ×2 IMPLANT
ATTUNE MED ANAT PAT 41MM KNEE (Knees) ×1 IMPLANT
ATTUNE PS FEM LT SZ 7 CEM KNEE (Femur) ×3 IMPLANT
ATTUNE PSRP INSR SZ7 6 KNEE (Insert) ×2 IMPLANT
ATTUNE PSRP INSR SZ7 6MM KNEE (Insert) ×1 IMPLANT
BAG ZIPLOCK 12X15 (MISCELLANEOUS) IMPLANT
BANDAGE ACE 6X5 VEL STRL LF (GAUZE/BANDAGES/DRESSINGS) ×3 IMPLANT
BASE TIBIAL ROT PLAT SZ 7 KNEE (Knees) ×1 IMPLANT
BLADE SAW SGTL 11.0X1.19X90.0M (BLADE) IMPLANT
BLADE SAW SGTL 13.0X1.19X90.0M (BLADE) ×6 IMPLANT
BLADE SURG SZ10 CARB STEEL (BLADE) ×6 IMPLANT
BOWL SMART MIX CTS (DISPOSABLE) ×3 IMPLANT
CEMENT HV SMART SET (Cement) ×6 IMPLANT
COVER SURGICAL LIGHT HANDLE (MISCELLANEOUS) ×3 IMPLANT
COVER WAND RF STERILE (DRAPES) IMPLANT
CUFF TOURN SGL QUICK 34 (TOURNIQUET CUFF) ×2
CUFF TRNQT CYL 34X4X40X1 (TOURNIQUET CUFF) ×1 IMPLANT
DECANTER SPIKE VIAL GLASS SM (MISCELLANEOUS) ×6 IMPLANT
DERMABOND ADVANCED (GAUZE/BANDAGES/DRESSINGS) ×2
DERMABOND ADVANCED .7 DNX12 (GAUZE/BANDAGES/DRESSINGS) ×1 IMPLANT
DRAPE U-SHAPE 47X51 STRL (DRAPES) ×3 IMPLANT
DRESSING AQUACEL AG SP 3.5X10 (GAUZE/BANDAGES/DRESSINGS) ×1 IMPLANT
DRSG AQUACEL AG ADV 3.5X10 (GAUZE/BANDAGES/DRESSINGS) ×3 IMPLANT
DRSG AQUACEL AG SP 3.5X10 (GAUZE/BANDAGES/DRESSINGS) ×3
DURAPREP 26ML APPLICATOR (WOUND CARE) ×6 IMPLANT
ELECT REM PT RETURN 15FT ADLT (MISCELLANEOUS) ×3 IMPLANT
GLOVE BIOGEL M 7.0 STRL (GLOVE) IMPLANT
GLOVE BIOGEL PI IND STRL 7.5 (GLOVE) ×1 IMPLANT
GLOVE BIOGEL PI IND STRL 8.5 (GLOVE) ×1 IMPLANT
GLOVE BIOGEL PI INDICATOR 7.5 (GLOVE) ×2
GLOVE BIOGEL PI INDICATOR 8.5 (GLOVE) ×2
GLOVE ECLIPSE 8.0 STRL XLNG CF (GLOVE) ×3 IMPLANT
GLOVE ORTHO TXT STRL SZ7.5 (GLOVE) ×6 IMPLANT
GOWN STRL REUS W/TWL 2XL LVL3 (GOWN DISPOSABLE) ×3 IMPLANT
GOWN STRL REUS W/TWL LRG LVL3 (GOWN DISPOSABLE) ×3 IMPLANT
HANDPIECE INTERPULSE COAX TIP (DISPOSABLE) ×2
HOLDER FOLEY CATH W/STRAP (MISCELLANEOUS) IMPLANT
MANIFOLD NEPTUNE II (INSTRUMENTS) ×3 IMPLANT
NDL SAFETY ECLIPSE 18X1.5 (NEEDLE) IMPLANT
NEEDLE HYPO 18GX1.5 SHARP (NEEDLE)
PACK TOTAL KNEE CUSTOM (KITS) ×3 IMPLANT
PIN FIX SIGMA HP QUICK REL (PIN) ×3 IMPLANT
PIN THREADED HEADED SIGMA (PIN) ×3 IMPLANT
PROTECTOR NERVE ULNAR (MISCELLANEOUS) ×3 IMPLANT
SET HNDPC FAN SPRY TIP SCT (DISPOSABLE) ×1 IMPLANT
SET PAD KNEE POSITIONER (MISCELLANEOUS) ×3 IMPLANT
SHIELD SPLASH 9X12 PIC/PGM (MISCELLANEOUS) ×3 IMPLANT
SUT MNCRL AB 4-0 PS2 18 (SUTURE) ×3 IMPLANT
SUT STRATAFIX PDS+ 0 24IN (SUTURE) ×3 IMPLANT
SUT VIC AB 1 CT1 36 (SUTURE) ×3 IMPLANT
SUT VIC AB 2-0 CT1 27 (SUTURE) ×6
SUT VIC AB 2-0 CT1 TAPERPNT 27 (SUTURE) ×3 IMPLANT
SYRINGE 3CC LL L/F (MISCELLANEOUS) ×3 IMPLANT
TIBIAL BASE ROT PLAT SZ 7 KNEE (Knees) ×3 IMPLANT
TRAY FOLEY MTR SLVR 16FR STAT (SET/KITS/TRAYS/PACK) ×3 IMPLANT
WATER STERILE IRR 1000ML POUR (IV SOLUTION) ×3 IMPLANT
WRAP KNEE MAXI GEL POST OP (GAUZE/BANDAGES/DRESSINGS) ×3 IMPLANT
YANKAUER SUCT BULB TIP 10FT TU (MISCELLANEOUS) ×3 IMPLANT

## 2018-02-02 NOTE — Op Note (Signed)
NAME:  Patrick Lam                      MEDICAL RECORD NO.:  [id number]                             [facility]      PHYSICIAN:  Madlyn FrankelMatthew D. Charlann Boxerlin, M.D.  DATE OF BIRTH:  11/25/1945      DATE OF PROCEDURE:  02/02/2018                                     OPERATIVE REPORT         PREOPERATIVE DIAGNOSIS:  Left knee osteoarthritis.      POSTOPERATIVE DIAGNOSIS:  Left knee osteoarthritis.      FINDINGS:  The patient was noted to have complete loss of cartilage and   bone-on-bone arthritis with associated osteophytes in the medial and patellofemoral compartments of   the knee with a significant synovitis and associated effusion.  The patient had failed months of conservative treatment including medications, injection therapy, activity modification.     PROCEDURE:  Left total knee replacement.      COMPONENTS USED:  DePuy Attune rotating platform posterior stabilized knee   system, a size 7 femur, 7 tibia, size 6 mm PS AOX insert, and 41 anatomic patellar   button.      SURGEON:  Madlyn FrankelMatthew D. Charlann Boxerlin, M.D.      ASSISTANT:  Skip MayerBlair Roberts, PA-C.      ANESTHESIA:  Regional and Spinal.      SPECIMENS:  None.      COMPLICATION:  None.      DRAINS:  None.  EBL: <150cc      TOURNIQUET TIME:   Total Tourniquet Time Documented: Thigh (Left) - 34 minutes Total: Thigh (Left) - 34 minutes  .      The patient was stable to the recovery room.      INDICATION FOR PROCEDURE:  Patrick Lam is a 72 y.o. male patient of   mine.  The patient had been seen, evaluated, and treated for months conservatively in the   office with medication, activity modification, and injections.  The patient had   radiographic changes of bone-on-bone arthritis with endplate sclerosis and osteophytes noted.  Based on the radiographic changes and failed conservative measures, the patient   decided to proceed with definitive treatment, total knee replacement.  Risks of infection, DVT, component failure, need for  revision surgery, neurovascular injury were reviewed in the office setting.  The postop course was reviewed stressing the efforts to maximize post-operative satisfaction and function.  Consent was obtained for benefit of pain   relief.      PROCEDURE IN DETAIL:  The patient was brought to the operative theater.   Once adequate anesthesia, preoperative antibiotics, 2 gm of Ancef,1 gm of Tranexamic Acid, and 10 mg of Decadron administered, the patient was positioned supine with a left thigh tourniquet placed.  The  left lower extremity was prepped and draped in sterile fashion.  A time-   out was performed identifying the patient, planned procedure, and the appropriate extremity.      The left lower extremity [facility].  The leg was   exsanguinated, tourniquet elevated to 250 mmHg.  A midline incision was   made followed by  median parapatellar arthrotomy.  Following initial   exposure, attention was first directed to the patella.  Precut   measurement was noted to be 26 mm.  I resected down to 14 mm and used a   41 anatomic patellar button to restore patellar height as well as cover the cut surface.      The lug holes were drilled and a metal shim was placed to protect the   patella from retractors and saw blade during the procedure.      At this point, attention was now directed to the femur.  The femoral   canal was opened with a drill, irrigated to try to prevent fat emboli.  An   intramedullary rod was passed at 5 degrees valgus, 9 mm of bone was   resected off the distal femur.  Following this resection, the tibia was   subluxated anteriorly.  Using the extramedullary guide, 2 mm of bone was resected off   the proximal mediak tibia.  We confirmed the gap would be   stable medially and laterally with a size 5 spacer block as well as confirmed that the tibial cut was perpendicular in the coronal plane, checking with an alignment rod.      Once this was done, I  sized the femur to be a size 7 in the anterior-   posterior dimension, chose a standard component based on medial and   lateral dimension.  The size 7 rotation block was then pinned in   position anterior referenced using the C-clamp to set rotation.  The   anterior, posterior, and  chamfer cuts were made without difficulty nor   notching making certain that I was along the anterior cortex to help   with flexion gap stability.      The final box cut was made off the lateral aspect of distal femur.      At this point, the tibia was sized to be a size 7.  The size 7 tray was   then pinned in position through the medial third of the tubercle,   drilled, and keel punched.  Trial reduction was now carried with a 7 femur,  7 tibia, a size 6 mm PS insert, and the 41 anatomic patella botton.  The knee was brought to full extension with good flexion stability with the patella   tracking through the trochlea without application of pressure.  Given   all these findings the trial components removed.  Final components were   opened and cement was mixed.  The knee was irrigated with normal saline solution and pulse lavage.  The synovial lining was   then injected with 30 cc of 0.25% Marcaine with epinephrine, 1 cc of Toradol and 30 cc of NS for a total of 61 cc.     Final implants were then cemented onto cleaned and dried cut surfaces of bone with the knee brought to extension with a size 6 mm PS trial insert.      Once the cement had fully cured, excess cement was removed   throughout the knee.  I confirmed that I was satisfied with the range of   motion and stability, and the final size 6 mm PS AOX insert was chosen.  It was   placed into the knee.      The tourniquet had been let down at 34 minutes.  No significant   hemostasis was required.  The extensor mechanism was then reapproximated using #1 Vicryl and #1 Stratafix  sutures with the knee   in flexion.  The   remaining wound was closed with 2-0  Vicryl and running 4-0 Monocryl.   The knee was cleaned, dried, dressed sterilely using Dermabond and   Aquacel dressing.  The patient was then   brought to recovery room in stable condition, tolerating the procedure   well.   Please note that Physician Assistant, Skip Mayer, PA-C was present for the entirety of the case, and was utilized for pre-operative positioning, peri-operative retractor management, general facilitation of the procedure and for primary wound closure at the end of the case.              Madlyn Frankel Charlann Boxer, M.D.    02/02/2018 3:42 PM

## 2018-02-02 NOTE — Transfer of Care (Signed)
Immediate Anesthesia Transfer of Care Note  Patient: Patrick Lam  Procedure(s) Performed: TOTAL KNEE ARTHROPLASTY (Left )  Patient Location: PACU  Anesthesia Type:Spinal  Level of Consciousness: awake  Airway & Oxygen Therapy: Patient Spontanous Breathing and Patient connected to face mask oxygen  Post-op Assessment: Report given to RN and Post -op Vital signs reviewed and stable  Post vital signs: Reviewed and stable  Last Vitals:  Vitals Value Taken Time  BP 112/62 02/02/2018  4:26 PM  Temp    Pulse 63 02/02/2018  4:27 PM  Resp 16 02/02/2018  4:27 PM  SpO2 100 % 02/02/2018  4:27 PM  Vitals shown include unvalidated device data.  Last Pain:  Vitals:   02/02/18 1349  TempSrc:   PainSc: 0-No pain      Patients Stated Pain Goal: 4 (02/02/18 1224)  Complications: No apparent anesthesia complications

## 2018-02-02 NOTE — Anesthesia Procedure Notes (Signed)
Spinal  Start time: 02/02/2018 2:25 PM End time: 02/02/2018 2:19 PM Staffing Resident/CRNA: Nelle DonPulliam, Donell Tomkins C, CRNA Performed: resident/CRNA  Preanesthetic Checklist Completed: patient identified, surgical consent, pre-op evaluation, IV checked, risks and benefits discussed and monitors and equipment checked Spinal Block Patient position: sitting Prep: DuraPrep Patient monitoring: heart rate, continuous pulse ox and blood pressure Approach: midline Location: L2-3 Injection technique: single-shot Needle Needle type: Pencan  Needle gauge: 24 G Assessment Sensory level: T6

## 2018-02-02 NOTE — Interval H&P Note (Signed)
History and Physical Interval Note:  02/02/2018 12:57 PM  Patrick CornerNiels Wank  has presented today for surgery, with the diagnosis of left knee osteoarthritis  The various methods of treatment have been discussed with the patient and family. After consideration of risks, benefits and other options for treatment, the patient has consented to  Procedure(s) with comments: TOTAL KNEE ARTHROPLASTY (Left) - 70min as a surgical intervention .  The patient's history has been reviewed, patient examined, no change in status, stable for surgery.  I have reviewed the patient's chart and labs.  Questions were answered to the patient's satisfaction.     Shelda PalMatthew D Heliodoro Domagalski

## 2018-02-02 NOTE — Discharge Instructions (Signed)

## 2018-02-02 NOTE — Progress Notes (Signed)
AssistedDr. Carignan with left, ultrasound guided, adductor canal block. Side rails up, monitors on throughout procedure. See vital signs in flow sheet. Tolerated Procedure well.  

## 2018-02-02 NOTE — Anesthesia Postprocedure Evaluation (Signed)
Anesthesia Post Note  Patient: Patrick Lam  Procedure(s) Performed: TOTAL KNEE ARTHROPLASTY (Left )     Patient location during evaluation: PACU Anesthesia Type: Spinal Level of consciousness: awake and alert Pain management: pain level controlled Vital Signs Assessment: post-procedure vital signs reviewed and stable Respiratory status: spontaneous breathing and respiratory function stable Cardiovascular status: blood pressure returned to baseline and stable Postop Assessment: no headache, no backache, spinal receding and no apparent nausea or vomiting Anesthetic complications: no    Last Vitals:  Vitals:   02/02/18 1840 02/02/18 1928  BP: 111/62 128/65  Pulse: 77 83  Resp: 18 16  Temp:  36.4 C  SpO2: 95% 95%    Last Pain:  Vitals:   02/02/18 1928  TempSrc: Oral  PainSc:                  [name], [name]

## 2018-02-02 NOTE — Care Plan (Signed)
Ortho Bundle Case Management Note  Patient Details  Name: Patrick Lam MRN: [id number] Date of Birth: 07/12/1945  L TKA on 02-02-18 DCP:  Home with spouse.  1 story home with no ste. DME:  No needs. Has a RW and 3-in-1 PT:  Virtual PT                   DME Arranged:  N/A DME Agency:  NA  HH Arranged:  NA HH Agency:  NA  Additional Comments: Please contact me with any questions of if this plan should need to change.  Ennis FortsJill A Lauer, RN,CCM [facility]  [phone] 02/02/2018, 12:18 PM

## 2018-02-02 NOTE — Anesthesia Procedure Notes (Signed)
Date/Time: 02/02/2018 2:12 PM Performed by: Nelle DonPulliam, Williams Dietrick C, CRNA Pre-anesthesia Checklist: Patient identified, Emergency Drugs available, Suction available and Patient being monitored Patient Re-evaluated:Patient Re-evaluated prior to induction Oxygen Delivery Method: Simple face mask

## 2018-02-02 NOTE — Anesthesia Procedure Notes (Signed)
Anesthesia Regional Block: Adductor canal block   Pre-Anesthetic Checklist: ,, timeout performed, Correct Patient, Correct Site, Correct Laterality, Correct Procedure, Correct Position, site marked, Risks and benefits discussed,  Surgical consent,  Pre-op evaluation,  At surgeon's request and post-op pain management  Laterality: Left and Lower  Prep: Maximum Sterile Barrier Precautions used, chloraprep       Needles:  Injection technique: Single-shot  Needle Type: Echogenic Stimulator Needle     Needle Length: 10cm      Additional Needles:   Procedures:,,,, ultrasound used (permanent image in chart),,,,  Narrative:  Start time: 02/02/2018 1:39 PM End time: 02/02/2018 1:49 PM Injection made incrementally with aspirations every 5 mL.  Performed by: Personally  Anesthesiologist: [name], Anel Purohit, MD  Additional Notes: Risks, benefits and alternative to block explained extensively.  Patient tolerated procedure well, without complications.

## 2018-02-02 NOTE — Anesthesia Preprocedure Evaluation (Signed)
Anesthesia Evaluation  Patient identified by MRN, date of birth, ID band Patient awake    Reviewed: Allergy & Precautions, NPO status , Patient's Chart, lab work & pertinent test results  Airway Mallampati: II  TM Distance: >3 FB Neck ROM: Full    Dental no notable dental hx.    Pulmonary Current Smoker,    Pulmonary exam normal breath sounds clear to auscultation       Cardiovascular hypertension, Pt. on medications negative cardio ROS Normal cardiovascular exam Rhythm:Regular Rate:Normal     Neuro/Psych negative neurological ROS  negative psych ROS   GI/Hepatic negative GI ROS, Neg liver ROS,   Endo/Other  negative endocrine ROS  Renal/GU negative Renal ROS  negative genitourinary   Musculoskeletal negative musculoskeletal ROS (+)   Abdominal   Peds negative pediatric ROS (+)  Hematology negative hematology ROS (+)   Anesthesia Other Findings   Reproductive/Obstetrics negative OB ROS                             Anesthesia Physical Anesthesia Plan  ASA: II  Anesthesia Plan: Spinal   Post-op Pain Management:  Regional for Post-op pain   Induction:   PONV Risk Score and Plan: 1 and Treatment may vary due to age or medical condition and Ondansetron  Airway Management Planned: Simple Face Mask  Additional Equipment:   Intra-op Plan:   Post-operative Plan:   Informed Consent: I have reviewed the patients History and Physical, chart, labs and discussed the procedure including the risks, benefits and alternatives for the proposed anesthesia with the patient or authorized representative who has indicated his/her understanding and acceptance.   Dental advisory given  Plan Discussed with:   Anesthesia Plan Comments:         Anesthesia Quick Evaluation

## 2018-02-03 ENCOUNTER — Encounter (HOSPITAL_COMMUNITY): Payer: Self-pay | Admitting: Orthopedic Surgery

## 2018-02-03 DIAGNOSIS — M1712 Unilateral primary osteoarthritis, left knee: Secondary | ICD-10-CM | POA: Diagnosis present

## 2018-02-03 DIAGNOSIS — R7303 Prediabetes: Secondary | ICD-10-CM | POA: Diagnosis present

## 2018-02-03 DIAGNOSIS — Z7984 Long term (current) use of oral hypoglycemic drugs: Secondary | ICD-10-CM | POA: Diagnosis not present

## 2018-02-03 DIAGNOSIS — M25462 Effusion, left knee: Secondary | ICD-10-CM | POA: Diagnosis present

## 2018-02-03 DIAGNOSIS — G47411 Narcolepsy with cataplexy: Secondary | ICD-10-CM | POA: Diagnosis present

## 2018-02-03 DIAGNOSIS — F17210 Nicotine dependence, cigarettes, uncomplicated: Secondary | ICD-10-CM | POA: Diagnosis present

## 2018-02-03 DIAGNOSIS — E663 Overweight: Secondary | ICD-10-CM | POA: Diagnosis present

## 2018-02-03 DIAGNOSIS — I10 Essential (primary) hypertension: Secondary | ICD-10-CM | POA: Diagnosis present

## 2018-02-03 DIAGNOSIS — M659 Synovitis and tenosynovitis, unspecified: Secondary | ICD-10-CM | POA: Diagnosis present

## 2018-02-03 DIAGNOSIS — Z6825 Body mass index (BMI) 25.0-25.9, adult: Secondary | ICD-10-CM | POA: Diagnosis not present

## 2018-02-03 DIAGNOSIS — M25562 Pain in left knee: Secondary | ICD-10-CM | POA: Diagnosis present

## 2018-02-03 DIAGNOSIS — Z79899 Other long term (current) drug therapy: Secondary | ICD-10-CM | POA: Diagnosis not present

## 2018-02-03 LAB — CBC
HCT: 41.7 % (ref 39.0–52.0)
Hemoglobin: 12.9 g/dL — ABNORMAL LOW (ref 13.0–17.0)
MCH: 24.2 pg — ABNORMAL LOW (ref 26.0–34.0)
MCHC: 30.9 g/dL (ref 30.0–36.0)
MCV: 78.1 fL — ABNORMAL LOW (ref 80.0–100.0)
Platelets: 219 10*3/uL (ref 150–400)
RBC: 5.34 MIL/uL (ref 4.22–5.81)
RDW: 15.9 % — ABNORMAL HIGH (ref 11.5–15.5)
WBC: 15.3 10*3/uL — AB (ref 4.0–10.5)
nRBC: 0 % (ref 0.0–0.2)

## 2018-02-03 LAB — BASIC METABOLIC PANEL
Anion gap: 10 (ref 5–15)
BUN: 20 mg/dL (ref 8–23)
CO2: 29 mmol/L (ref 22–32)
Calcium: 8.2 mg/dL — ABNORMAL LOW (ref 8.9–10.3)
Chloride: 97 mmol/L — ABNORMAL LOW (ref 98–111)
Creatinine, Ser: 1.08 mg/dL (ref 0.61–1.24)
GFR calc non Af Amer: >60 mL/min (ref >60)
Glucose, Bld: 163 mg/dL — ABNORMAL HIGH (ref 70–99)
POTASSIUM: 4.9 mmol/L (ref 3.5–5.1)
Sodium: 136 mmol/L (ref 135–145)

## 2018-02-03 LAB — GLUCOSE, CAPILLARY
GLUCOSE-CAPILLARY: 134 mg/dL — AB (ref 70–99)
Glucose-Capillary: 136 mg/dL — ABNORMAL HIGH (ref 70–99)
Glucose-Capillary: 207 mg/dL — ABNORMAL HIGH (ref 70–99)
Glucose-Capillary: 161 mg/dL — ABNORMAL HIGH (ref 70–99)

## 2018-02-03 MED ORDER — SODIUM OXYBATE 500 MG/ML PO SOLN
4500.0000 mg | ORAL | Status: DC
  Administered 2018-02-03 – 2018-02-04 (×4): 4500 mg via ORAL
  Filled 2018-02-03: qty 9

## 2018-02-03 MED ORDER — DILTIAZEM HCL ER COATED BEADS 240 MG PO CP24
240.0000 mg | ORAL_CAPSULE | Freq: Every day | ORAL | Status: DC
  Administered 2018-02-03 – 2018-02-04 (×2): 240 mg via ORAL
  Filled 2018-02-03 (×2): qty 1

## 2018-02-03 MED ORDER — SODIUM OXYBATE 500 MG/ML PO SOLN
4500.0000 mg | ORAL | Status: DC
  Filled 2018-02-03 (×11): qty 9

## 2018-02-03 NOTE — Progress Notes (Signed)
     Subjective: 1 Day Post-Op Procedure(s) (LRB): TOTAL KNEE ARTHROPLASTY (Left)   Patient reports pain as mild, pain controlled.  No events throughout the night.  Plan for discharge tomorrow due to pain control and need for inpatient therapy to meet goal of being discharged home safely with family/caregiver.   Anticipated LOS equal to or greater than 2 midnights due to - Age 74 and older with one or more of the following:  - Expected need for hospital services (PT, OT, Nursing) required for safe  discharge   Objective:   VITALS:   Vitals:   02/03/18 0053 02/03/18 0440  BP: 107/73 105/72  Pulse: 79 63  Resp: 16 16  Temp: 97.6 F (36.4 C) (!) 97.4 F (36.3 C)  SpO2: 91% 96%    Dorsiflexion/Plantar flexion intact Incision: dressing C/D/I No cellulitis present Compartment soft  LABS Recent Labs    02/03/18 0553  HGB 12.9*  HCT 41.7  WBC 15.3*  PLT 219    Recent Labs    02/03/18 0553  NA 136  K 4.9  BUN 20  CREATININE 1.08  GLUCOSE 163*     Assessment/Plan: 1 Day Post-Op Procedure(s) (LRB): TOTAL KNEE ARTHROPLASTY (Left) Foley cath d/c'ed Advance diet Up with therapy D/C IV fluids Discharge home eventually when ready   Overweight (BMI 25-29.9) Estimated body mass index is 28.07 kg/m as calculated from the following:   Height as of this encounter: 6' (1.829 m).   Weight as of this encounter: 93.9 kg. Patient also counseled that weight may inhibit the healing process Patient counseled that losing weight will help with future health issues      Anastasio AuerbachMatthew S. Palma Buster   PAC  02/03/2018, 8:48 AM

## 2018-02-03 NOTE — Plan of Care (Signed)
Problem: Education: Goal: Knowledge of General Education information will improve Description Including pain rating scale, medication(s)/side effects and non-pharmacologic comfort measures Outcome: Progressing   Problem: Health Behavior/Discharge Planning: Goal: Ability to manage health-related needs will improve Outcome: Progressing   Problem: Clinical Measurements: Goal: Ability to maintain clinical measurements within normal limits will improve Outcome: Progressing Goal: Will remain free from infection Outcome: Progressing Goal: Diagnostic test results will improve Outcome: Progressing Goal: Respiratory complications will improve Outcome: Progressing Goal: Cardiovascular complication will be avoided Outcome: Progressing   Problem: Activity: Goal: Risk for activity intolerance will decrease Outcome: Progressing   Problem: Nutrition: Goal: Adequate nutrition will be maintained Outcome: Progressing   Problem: Coping: Goal: Level of anxiety will decrease Outcome: Progressing   Problem: Elimination: Goal: Will not experience complications related to bowel motility Outcome: Progressing Goal: Will not experience complications related to urinary retention Outcome: Progressing   Problem: Pain Managment: Goal: General experience of comfort will improve Outcome: Progressing   Problem: Safety: Goal: Ability to remain free from injury will improve Outcome: Progressing   Problem: Skin Integrity: Goal: Risk for impaired skin integrity will decrease Outcome: Progressing   Problem: Education: Goal: Knowledge of the prescribed therapeutic regimen will improve Outcome: Progressing Goal: Individualized Educational Video(s) Outcome: Progressing   Problem: Activity: Goal: Ability to avoid complications of mobility impairment will improve Outcome: Progressing Goal: Range of joint motion will improve Outcome: Progressing   Problem: Clinical Measurements: Goal:  Postoperative complications will be avoided or minimized Outcome: Progressing   Problem: Pain Management: Goal: Pain level will decrease with appropriate interventions Outcome: Progressing   Problem: Skin Integrity: Goal: Will show signs of wound healing Outcome: Progressing   Problem: Education: Goal: Knowledge of General Education information will improve Description Including pain rating scale, medication(s)/side effects and non-pharmacologic comfort measures 02/03/2018 2309 by Kizzie Bane, RN Outcome: Progressing 02/03/2018 2309 by Kizzie Bane, RN Outcome: Progressing   Problem: Health Behavior/Discharge Planning: Goal: Ability to manage health-related needs will improve 02/03/2018 2309 by Kizzie Bane, RN Outcome: Progressing 02/03/2018 2309 by Kizzie Bane, RN Outcome: Progressing   Problem: Clinical Measurements: Goal: Ability to maintain clinical measurements within normal limits will improve 02/03/2018 2309 by Kizzie Bane, RN Outcome: Progressing 02/03/2018 2309 by Kizzie Bane, RN Outcome: Progressing Goal: Will remain free from infection 02/03/2018 2309 by Kizzie Bane, RN Outcome: Progressing 02/03/2018 2309 by Kizzie Bane, RN Outcome: Progressing Goal: Diagnostic test results will improve 02/03/2018 2309 by Kizzie Bane, RN Outcome: Progressing 02/03/2018 2309 by Kizzie Bane, RN Outcome: Progressing Goal: Respiratory complications will improve 02/03/2018 2309 by Kizzie Bane, RN Outcome: Progressing 02/03/2018 2309 by Kizzie Bane, RN Outcome: Progressing Goal: Cardiovascular complication will be avoided 02/03/2018 2309 by Kizzie Bane, RN Outcome: Progressing 02/03/2018 2309 by Kizzie Bane, RN Outcome: Progressing   Problem: Activity: Goal: Risk for activity intolerance will decrease 02/03/2018 2309 by Kizzie Bane, RN Outcome:  Progressing 02/03/2018 2309 by Kizzie Bane, RN Outcome: Progressing   Problem: Nutrition: Goal: Adequate nutrition will be maintained 02/03/2018 2309 by Kizzie Bane, RN Outcome: Progressing 02/03/2018 2309 by Kizzie Bane, RN Outcome: Progressing   Problem: Coping: Goal: Level of anxiety will decrease 02/03/2018 2309 by Kizzie Bane, RN Outcome: Progressing 02/03/2018 2309 by Kizzie Bane, RN Outcome: Progressing   Problem: Elimination: Goal: Will not experience complications related to bowel motility 02/03/2018 2309 by Kizzie Bane, RN Outcome: Progressing 02/03/2018 2309 by Kizzie Bane, RN Outcome: Progressing Goal: Will not experience complications related to urinary retention 02/03/2018 2309 by Kizzie Bane, RN Outcome: Progressing  02/03/2018 2309 by Kizzie BaneWilliams, Elzia Hott, RN Outcome: Progressing   Problem: Pain Managment: Goal: General experience of comfort will improve 02/03/2018 2309 by Kizzie BaneWilliams, Arianny Pun, RN Outcome: Progressing 02/03/2018 2309 by Kizzie BaneWilliams, Vincie Linn, RN Outcome: Progressing   Problem: Safety: Goal: Ability to remain free from injury will improve 02/03/2018 2309 by Kizzie BaneWilliams, Trevyn Lumpkin, RN Outcome: Progressing 02/03/2018 2309 by Kizzie BaneWilliams, Chinara Hertzberg, RN Outcome: Progressing   Problem: Skin Integrity: Goal: Risk for impaired skin integrity will decrease 02/03/2018 2309 by Kizzie BaneWilliams, Taji Barretto, RN Outcome: Progressing 02/03/2018 2309 by Kizzie BaneWilliams, Grier Czerwinski, RN Outcome: Progressing   Problem: Education: Goal: Knowledge of the prescribed therapeutic regimen will improve 02/03/2018 2309 by Kizzie BaneWilliams, Jasani Dolney, RN Outcome: Progressing 02/03/2018 2309 by Kizzie BaneWilliams, Langley Flatley, RN Outcome: Progressing Goal: Individualized Educational Video(s) 02/03/2018 2309 by Kizzie BaneWilliams, Tommye Lehenbauer, RN Outcome: Progressing 02/03/2018 2309 by Kizzie BaneWilliams, Konner Saiz, RN Outcome: Progressing   Problem:  Activity: Goal: Ability to avoid complications of mobility impairment will improve 02/03/2018 2309 by Kizzie BaneWilliams, Jakelin Taussig, RN Outcome: Progressing 02/03/2018 2309 by Kizzie BaneWilliams, Redell Bhandari, RN Outcome: Progressing Goal: Range of joint motion will improve 02/03/2018 2309 by Kizzie BaneWilliams, Kelvyn Schunk, RN Outcome: Progressing 02/03/2018 2309 by Kizzie BaneWilliams, Ola Fawver, RN Outcome: Progressing   Problem: Clinical Measurements: Goal: Postoperative complications will be avoided or minimized 02/03/2018 2309 by Kizzie BaneWilliams, Tinsley Lomas, RN Outcome: Progressing 02/03/2018 2309 by Kizzie BaneWilliams, Tifanny Dollens, RN Outcome: Progressing   Problem: Pain Management: Goal: Pain level will decrease with appropriate interventions 02/03/2018 2309 by Kizzie BaneWilliams, Lebron Nauert, RN Outcome: Progressing 02/03/2018 2309 by Kizzie BaneWilliams, Raynard Mapps, RN Outcome: Progressing   Problem: Skin Integrity: Goal: Will show signs of wound healing 02/03/2018 2309 by Kizzie BaneWilliams, Hendy Brindle, RN Outcome: Progressing 02/03/2018 2309 by Kizzie BaneWilliams, Cortlynn Hollinsworth, RN Outcome: Progressing

## 2018-02-03 NOTE — Evaluation (Signed)
Physical Therapy Evaluation Patient Details Name: Patrick Lam MRN: [id number] DOB: 03/09/1945 Today's Date: 02/03/2018   History of Present Illness  L TKA  Clinical Impression  Pt is s/p TKA resulting in the deficits listed below (see PT Problem List). Pt ambulated 150' with RW, no loss of balance. Initiated HEP. Good progress expected.  Pt will benefit from skilled PT to increase their independence and safety with mobility to allow discharge to the venue listed below.      Follow Up Recommendations Follow surgeon's recommendation for DC plan and follow-up therapies    Equipment Recommendations  3in1 (PT)    Recommendations for Other Services       Precautions / Restrictions Precautions Precautions: Knee;Fall Precaution Comments: reviewed no pillow under knee Restrictions Weight Bearing Restrictions: No Other Position/Activity Restrictions: WBAT      Mobility  Bed Mobility Overal bed mobility: Modified Independent             General bed mobility comments: HOB up, used rail  Transfers Overall transfer level: Needs assistance Equipment used: Rolling walker (2 wheeled) Transfers: Sit to/from Stand Sit to Stand: Min guard         General transfer comment: VCs hand placement  Ambulation/Gait Ambulation/Gait assistance: Min guard Gait Distance (Feet): 150 Feet Assistive device: Rolling walker (2 wheeled) Gait Pattern/deviations: Step-to pattern Gait velocity: decr   General Gait Details: VCs sequencing, no loss of balance nor buckling  Stairs            Wheelchair Mobility    Modified Rankin (Stroke Patients Only)       Balance Overall balance assessment: Modified Independent                                           Pertinent Vitals/Pain Pain Assessment: 0-10 Pain Score: 5  Pain Location: L knee Pain Descriptors / Indicators: Sore Pain Intervention(s): Limited activity within patient's tolerance;Monitored during  session;Premedicated before session;Ice applied    Home Living Family/patient expects to be discharged to:: Private residence Living Arrangements: Spouse/significant other Available Help at Discharge: Family Type of Home: House Home Access: Level entry     Home Layout: One level Home Equipment: Environmental consultant - 2 wheels      Prior Function Level of Independence: Independent               Hand Dominance        Extremity/Trunk Assessment   Upper Extremity Assessment Upper Extremity Assessment: Overall WFL for tasks assessed    Lower Extremity Assessment Lower Extremity Assessment: LLE deficits/detail LLE Deficits / Details: 0-50* AAROM L knee; SLR 3/5 LLE Sensation: WNL LLE Coordination: WNL       Communication   Communication: No difficulties  Cognition Arousal/Alertness: Awake/alert Behavior During Therapy: WFL for tasks assessed/performed Overall Cognitive Status: Within Functional Limits for tasks assessed                                        General Comments      Exercises Total Joint Exercises Ankle Circles/Pumps: AROM;Both;10 reps Quad Sets: AROM;Left;Supine;5 reps Short Arc Quad: AROM;Left;10 reps;Supine Heel Slides: AAROM;Left;10 reps;Supine Straight Leg Raises: AROM;Left;10 reps;Supine   Assessment/Plan    PT Assessment Patient needs continued PT services  PT Problem List Decreased range of motion;Decreased activity  tolerance;Decreased mobility;Pain       PT Treatment Interventions DME instruction;Gait training;Functional mobility training;Therapeutic exercise;Patient/family education    PT Goals (Current goals can be found in the Care Plan section)  Acute Rehab PT Goals Patient Stated Goal: work on antique cars, exercise at gym PT Goal Formulation: With patient Time For Goal Achievement: 02/03/18 Potential to Achieve Goals: Good    Frequency 7X/week   Barriers to discharge        Co-evaluation                AM-PAC PT "6 Clicks" Mobility  Outcome Measure Help needed turning from your back to your side while in a flat bed without using bedrails?: None Help needed moving from lying on your back to sitting on the side of a flat bed without using bedrails?: A Little Help needed moving to and from a bed to a chair (including a wheelchair)?: None Help needed standing up from a chair using your arms (e.g., wheelchair or bedside chair)?: None Help needed to walk in hospital room?: None Help needed climbing 3-5 steps with a railing? : A Little 6 Click Score: 22    End of Session Equipment Utilized During Treatment: Gait belt Activity Tolerance: Patient tolerated treatment well Patient left: in chair;with call bell/phone within reach Nurse Communication: Mobility status PT Visit Diagnosis: Pain;Difficulty in walking, not elsewhere classified (R26.2) Pain - Right/Left: Left Pain - part of body: Knee    Time: [id number] PT Time Calculation (min) (ACUTE ONLY): 31 min   Charges:   PT Evaluation $PT Eval Low Complexity: 1 Low PT Treatments $Gait Training: 8-22 mins        Tamala Ser{Patrick Lam PT 02/03/2018  Acute Rehabilitation Services Pager [phone] Office [phone]

## 2018-02-03 NOTE — Progress Notes (Signed)
Physical Therapy Treatment Patient Details Name: Patrick Lam MRN: [id number] DOB: 04/11/1945 Today's Date: 02/03/2018    History of Present Illness L TKA    PT Comments    Pt is progressing well with mobility, he ambulated 200' with RW and performed HEP with supervision. Continued good progress is expected, pt is motivated and puts forth good effort.  Follow Up Recommendations  Follow surgeon's recommendation for DC plan and follow-up therapies     Equipment Recommendations  3in1 (PT)    Recommendations for Other Services       Precautions / Restrictions Precautions Precautions: Knee;Fall Precaution Comments: reviewed no pillow under knee Restrictions Weight Bearing Restrictions: No Other Position/Activity Restrictions: WBAT    Mobility  Bed Mobility Overal bed mobility: Modified Independent             General bed mobility comments: up in recliner  Transfers Overall transfer level: Needs assistance Equipment used: Rolling walker (2 wheeled) Transfers: Sit to/from Stand Sit to Stand: Supervision         General transfer comment: VCs hand placement  Ambulation/Gait Ambulation/Gait assistance: Min guard Gait Distance (Feet): 200 Feet Assistive device: Rolling walker (2 wheeled) Gait Pattern/deviations: Step-to pattern Gait velocity: decr   General Gait Details: VCs sequencing, no loss of balance nor buckling   Stairs             Wheelchair Mobility    Modified Rankin (Stroke Patients Only)       Balance Overall balance assessment: Modified Independent                                          Cognition Arousal/Alertness: Awake/alert Behavior During Therapy: WFL for tasks assessed/performed Overall Cognitive Status: Within Functional Limits for tasks assessed                                        Exercises Total Joint Exercises Ankle Circles/Pumps: AROM;Both;10 reps Quad Sets:  AROM;Left;Supine;5 reps Short Arc Quad: AROM;Left;10 reps;Supine Heel Slides: AAROM;Left;10 reps;Supine Straight Leg Raises: AROM;Left;10 reps;Supine Long Arc Quad: AROM;Left;10 reps;Seated Knee Flexion: AROM;AAROM;Left;10 reps;Seated Goniometric ROM: 0-90* AAROM L knee    General Comments        Pertinent Vitals/Pain Pain Assessment: 0-10 Pain Score: 5  Pain Location: L knee Pain Descriptors / Indicators: Sore Pain Intervention(s): Limited activity within patient's tolerance;Monitored during session;Premedicated before session;Ice applied    Home Living Family/patient expects to be discharged to:: Private residence Living Arrangements: Spouse/significant other Available Help at Discharge: Family Type of Home: House Home Access: Level entry   Home Layout: One level Home Equipment: Environmental consultantWalker - 2 wheels      Prior Function Level of Independence: Independent          PT Goals (current goals can now be found in the care plan section) Acute Rehab PT Goals Patient Stated Goal: work on antique cars, exercise at gym PT Goal Formulation: With patient Time For Goal Achievement: 02/03/18 Potential to Achieve Goals: Good Progress towards PT goals: Progressing toward goals    Frequency    7X/week      PT Plan Current plan remains appropriate    Co-evaluation              AM-PAC PT "6 Clicks" Mobility   Outcome Measure  Help  needed turning from your back to your side while in a flat bed without using bedrails?: None Help needed moving from lying on your back to sitting on the side of a flat bed without using bedrails?: A Little Help needed moving to and from a bed to a chair (including a wheelchair)?: None Help needed standing up from a chair using your arms (e.g., wheelchair or bedside chair)?: None Help needed to walk in hospital room?: None Help needed climbing 3-5 steps with a railing? : A Little 6 Click Score: 22    End of Session Equipment Utilized During  Treatment: Gait belt Activity Tolerance: Patient tolerated treatment well Patient left: in chair;with call bell/phone within reach Nurse Communication: Mobility status PT Visit Diagnosis: Pain;Difficulty in walking, not elsewhere classified (R26.2) Pain - Right/Left: Left Pain - part of body: Knee     Time: 1254-1319 PT Time Calculation (min) (ACUTE ONLY): 25 min  Charges:  $Gait Training: 8-22 mins $Therapeutic Exercise: 8-22 mins                     Ralene Bathe Kistler PT 02/03/2018  Acute Rehabilitation Services Pager [phone] Office [phone]

## 2018-02-03 NOTE — Plan of Care (Signed)
Pt alert and oriented, doing well today. Pain well controlled with PO pain meds.  Up with therapy twice today. RN will monitor.

## 2018-02-04 ENCOUNTER — Encounter (HOSPITAL_COMMUNITY): Payer: Self-pay

## 2018-02-04 ENCOUNTER — Other Ambulatory Visit: Payer: Self-pay

## 2018-02-04 LAB — BASIC METABOLIC PANEL
Anion gap: 12 (ref 5–15)
BUN: 19 mg/dL (ref 8–23)
CO2: 29 mmol/L (ref 22–32)
Calcium: 8.5 mg/dL — ABNORMAL LOW (ref 8.9–10.3)
Chloride: 96 mmol/L — ABNORMAL LOW (ref 98–111)
Creatinine, Ser: 0.95 mg/dL (ref 0.61–1.24)
GFR calc Af Amer: >60 mL/min (ref >60)
GFR calc non Af Amer: >60 mL/min (ref >60)
Glucose, Bld: 166 mg/dL — ABNORMAL HIGH (ref 70–99)
Potassium: 4.1 mmol/L (ref 3.5–5.1)
Sodium: 137 mmol/L (ref 135–145)

## 2018-02-04 LAB — CBC
HCT: 40.8 % (ref 39.0–52.0)
Hemoglobin: 12.8 g/dL — ABNORMAL LOW (ref 13.0–17.0)
MCH: 24.1 pg — ABNORMAL LOW (ref 26.0–34.0)
MCHC: 31.4 g/dL (ref 30.0–36.0)
MCV: 76.8 fL — ABNORMAL LOW (ref 80.0–100.0)
Platelets: 184 10*3/uL (ref 150–400)
RBC: 5.31 MIL/uL (ref 4.22–5.81)
RDW: 15.9 % — ABNORMAL HIGH (ref 11.5–15.5)
WBC: 17.6 10*3/uL — ABNORMAL HIGH (ref 4.0–10.5)
nRBC: 0 % (ref 0.0–0.2)

## 2018-02-04 LAB — GLUCOSE, CAPILLARY
Glucose-Capillary: 135 mg/dL — ABNORMAL HIGH (ref 70–99)
Glucose-Capillary: 160 mg/dL — ABNORMAL HIGH (ref 70–99)

## 2018-02-04 NOTE — Progress Notes (Signed)
Patient ID: Patrick Lam, male   DOB: 01/02/1946, 72 y.o.   MRN: [id number] Subjective: 2 Days Post-Op Procedure(s) (LRB): TOTAL KNEE ARTHROPLASTY (Left)    Patient reports pain as mild.  Doing much better this am.  Pain control better. Progressed with PT well.  Ready to go home and work on restoring function  Objective:   VITALS:   Vitals:   02/03/18 2212 02/04/18 0502  BP: 123/65 133/73  Pulse: 76 65  Resp:  18  Temp: (!) 97.5 F (36.4 C) (!) 97.5 F (36.4 C)  SpO2: 91% 90%    Neurovascular intact Incision: dressing C/D/I  LABS Recent Labs    02/03/18 0553 02/04/18 0514  HGB 12.9* 12.8*  HCT 41.7 40.8  WBC 15.3* 17.6*  PLT 219 184    Recent Labs    02/03/18 0553 02/04/18 0514  NA 136 137  K 4.9 4.1  BUN 20 19  CREATININE 1.08 0.95  GLUCOSE 163* 166*    No results for input(s): LABPT, INR in the last 72 hours.   Assessment/Plan: 2 Days Post-Op Procedure(s) (LRB): TOTAL KNEE ARTHROPLASTY (Left)   Up with therapy  Home after am therapy - now safe for home discharge RTC in 2 weeks Safety reviewed Goals reviewed Aspirin for DVT prophylaxis

## 2018-02-04 NOTE — Care Management Note (Signed)
Case Management Note  Patient Details  Name: Patrick Lam MRN: [id number] Date of Birth: 12/06/1945  Subjective/Objective:    Spoke with patient at bedside. Plan for Virtual PT, equipment at home. Has RW but needs a cane.                 Action/Plan: Contacted [facility] to deliver to the room  Expected Discharge Date:  02/04/18               Expected Discharge Plan:  Home/Self Care  In-House Referral:  NA  Discharge planning Services  CM Consult  Post Acute Care Choice:    Choice offered to:     DME Arranged:  N/A DME Agency:  NA  HH Arranged:  NA HH Agency:  NA  Status of Service:  Completed, signed off  If discussed at Long Length of Stay Meetings, dates discussed:    Additional Comments:  Alexis Goodelleele, Aaleigha Bozza K, RN 02/04/2018, 11:02 AM

## 2018-02-04 NOTE — Progress Notes (Signed)
Physical Therapy Treatment Patient Details Name: Patrick Lam MRN: [id number] DOB: 04/03/1945 Today's Date: 02/04/2018    History of Present Illness L TKA    PT Comments    Pt continues to progress well; reviewed HEP and gait  progression;   Follow Up Recommendations  Follow surgeon's recommendation for DC plan and follow-up therapies     Equipment Recommendations  3in1 (PT)    Recommendations for Other Services       Precautions / Restrictions Precautions Precautions: Knee;Fall Precaution Comments: reviewed no pillow under knee Restrictions Weight Bearing Restrictions: No Other Position/Activity Restrictions: WBAT    Mobility  Bed Mobility Overal bed mobility: Modified Independent                Transfers   Equipment used: Rolling walker (2 wheeled) Transfers: Sit to/from Stand Sit to Stand: Modified independent (Device/Increase time)            Ambulation/Gait Ambulation/Gait assistance: Supervision Gait Distance (Feet): 180 Feet Assistive device: Rolling walker (2 wheeled) Gait Pattern/deviations: Step-through pattern     General Gait Details: cues for gait progression, no LOB, no instability noted L knee   Stairs             Wheelchair Mobility    Modified Rankin (Stroke Patients Only)       Balance                                            Cognition Arousal/Alertness: Awake/alert Behavior During Therapy: WFL for tasks assessed/performed Overall Cognitive Status: Within Functional Limits for tasks assessed                                        Exercises Total Joint Exercises Ankle Circles/Pumps: AROM;Both;10 reps Quad Sets: AROM;Left;Supine;10 reps Heel Slides: AAROM;Left;10 reps Straight Leg Raises: AROM;Left;10 reps Goniometric ROM: 5* to 98* AAROM L knee    General Comments        Pertinent Vitals/Pain Pain Assessment: 0-10 Pain Score: 2  Pain Location: L knee Pain  Descriptors / Indicators: Sore Pain Intervention(s): Limited activity within patient's tolerance;Monitored during session;Repositioned;Ice applied    Home Living                      Prior Function            PT Goals (current goals can now be found in the care plan section) Acute Rehab PT Goals Patient Stated Goal: work on antique cars, exercise at gym PT Goal Formulation: With patient Time For Goal Achievement: 02/03/18 Potential to Achieve Goals: Good Progress towards PT goals: Progressing toward goals    Frequency    7X/week      PT Plan Current plan remains appropriate    Co-evaluation              AM-PAC PT "6 Clicks" Mobility   Outcome Measure  Help needed turning from your back to your side while in a flat bed without using bedrails?: None Help needed moving from lying on your back to sitting on the side of a flat bed without using bedrails?: None Help needed moving to and from a bed to a chair (including a wheelchair)?: None Help needed standing up from a chair using your arms (e.g., wheelchair  or bedside chair)?: None Help needed to walk in hospital room?: None Help needed climbing 3-5 steps with a railing? : A Little 6 Click Score: 23    End of Session Equipment Utilized During Treatment: Gait belt Activity Tolerance: Patient tolerated treatment well Patient left: in chair;with call bell/phone within reach;with family/visitor present Nurse Communication: Mobility status PT Visit Diagnosis: Pain;Difficulty in walking, not elsewhere classified (R26.2) Pain - Right/Left: Left Pain - part of body: Knee     Time: [id number] PT Time Calculation (min) (ACUTE ONLY): 17 min  Charges:  $Gait Training: 8-22 mins                     Drucilla Chaletara Retal Tonkinson, PT  Pager: [phone] Acute Rehab Dept [facility]): [phone]   02/04/2018    [facility] Surgery Center IncWILLIAMS,Oda [place] 02/04/2018, 10:00 AM

## 2018-02-12 NOTE — Discharge Summary (Signed)
Physician Discharge Summary  Patient ID: Patrick Lam MRN: [id number] DOB/AGE: [birth year] 72 y.o.  Admit date: 02/02/2018 Discharge date: 02/04/2018   Procedures:  Procedure(s) (LRB): TOTAL KNEE ARTHROPLASTY (Left)  Attending Physician:  Dr. Durene Romans   Admission Diagnoses:   Left knee primary OA / pain  Discharge Diagnoses:  Active Problems:   s/p left TKA   Overweight (BMI 25.0-29.9)  Past Medical History:  Diagnosis Date  . Arthritis   . Hypertension   . Narcolepsy and cataplexy   . Pre-diabetes     HPI:    Patrick Lam, 72 y.o. male, has a history of pain and functional disability in the left knee due to arthritis and has failed non-surgical conservative treatments for greater than 12 weeks to include NSAID's and/or analgesics, corticosteriod injections, viscosupplementation injections and activity modification.  Onset of symptoms was gradual, starting 3-4 years ago with gradually worsening course since that time. The patient noted no past surgery on the left knee(s).  Patient currently rates pain in the left knee(s) at 6 out of 10 with activity. Patient has night pain, worsening of pain with activity and weight bearing, pain that interferes with activities of daily living, pain with passive range of motion, crepitus and joint swelling.  Patient has evidence of periarticular osteophytes and joint space narrowing by imaging studies. There is no active infection.  Risks, benefits and expectations were discussed with the patient.  Risks including but not limited to the risk of anesthesia, blood clots, nerve damage, blood vessel damage, failure of the prosthesis, infection and up to and including death.  Patient understand the risks, benefits and expectations and wishes to proceed with surgery.   PCP: Herma Carson, MD   Discharged Condition: good  Hospital Course:  Patient underwent the above stated procedure on 02/02/2018. Patient tolerated the procedure well and brought  to the recovery room in good condition and subsequently to the floor.  POD #1 BP: 105/72 ; Pulse: 63 ; Temp: 97.4 F (36.3 C) ; Resp: 16 Patient reports pain as mild, pain controlled.  No events throughout the night.  Plan for discharge tomorrowdue to pain control and need for inpatient therapy to meet goal of being discharged home safely with family/caregiver. Dorsiflexion/plantar flexion intact, incision: dressing C/D/I, no cellulitis present and compartment soft.   LABS  Basename    HGB     12.9  HCT     41.7   POD #2  BP: 133/73 ; Pulse: 65 ; Temp: 97.5 F (36.4 C) ; Resp: 18 Patient reports pain as mild.  Doing much better this am.  Pain control better. Progressed with PT well.  Ready to go home and work on restoring function. Neurovascular intact and incision: dressing C/D/I.   LABS  Basename    HGB     12.8  HCT     40.8    Discharge Exam: General appearance: alert, cooperative and no distress Extremities: Homans sign is negative, no sign of DVT, no edema, redness or tenderness in the calves or thighs and no ulcers, gangrene or trophic changes  Disposition:  Home with follow up in 2 weeks   Follow-up Information    Lanney Gins, PA-C. Go on 02/18/2018.   Specialty:  Orthopedic Surgery Why:  You are scheduled for a post-operative appointment on 02-18-18 at 2:15 pm.  Contact information: [address] [place] [address] [phone]           Discharge Instructions    Call  MD / Call 911   Complete by:  As directed    If you experience chest pain or shortness of breath, CALL 911 and be transported to the hospital emergency room.  If you develope a fever above 101 F, pus (white drainage) or increased drainage or redness at the wound, or calf pain, call your surgeon's office.   Call MD / Call 911   Complete by:  As directed    If you experience chest pain or shortness of breath, CALL 911 and be transported to the hospital emergency room.  If you  develope a fever above 101 F, pus (white drainage) or increased drainage or redness at the wound, or calf pain, call your surgeon's office.   Change dressing   Complete by:  As directed    Maintain surgical dressing until follow up in the clinic. If the edges start to pull up, may reinforce with tape. If the dressing is no longer working, may remove and cover with gauze and tape, but must keep the area dry and clean.  Call with any questions or concerns.   Change dressing   Complete by:  As directed    Maintain surgical dressing until follow up in the clinic. If the edges start to pull up, may reinforce with tape. If the dressing is no longer working, may remove and cover with gauze and tape, but must keep the area dry and clean.  Call with any questions or concerns.   Constipation Prevention   Complete by:  As directed    Drink plenty of fluids.  Prune juice may be helpful.  You may use a stool softener, such as Colace (over the counter) 100 mg twice a day.  Use MiraLax (over the counter) for constipation as needed.   Constipation Prevention   Complete by:  As directed    Drink plenty of fluids.  Prune juice may be helpful.  You may use a stool softener, such as Colace (over the counter) 100 mg twice a day.  Use MiraLax (over the counter) for constipation as needed.   Diet - low sodium heart healthy   Complete by:  As directed    Diet - low sodium heart healthy   Complete by:  As directed    Discharge instructions   Complete by:  As directed    Maintain surgical dressing until follow up in the clinic. If the edges start to pull up, may reinforce with tape. If the dressing is no longer working, may remove and cover with gauze and tape, but must keep the area dry and clean.  Follow up in 2 weeks at [facility]. Call with any questions or concerns.   Discharge instructions   Complete by:  As directed    Maintain surgical dressing until follow up in the clinic. If the edges start to  pull up, may reinforce with tape. If the dressing is no longer working, may remove and cover with gauze and tape, but must keep the area dry and clean.  Follow up in 2 weeks at [facility]. Call with any questions or concerns.   Increase activity slowly as tolerated   Complete by:  As directed    Weight bearing as tolerated with assist device (walker, cane, etc) as directed, use it as long as suggested by your surgeon or therapist, typically at least 4-6 weeks.   Increase activity slowly as tolerated   Complete by:  As directed    Weight bearing as tolerated with  assist device (walker, cane, etc) as directed, use it as long as suggested by your surgeon or therapist, typically at least 4-6 weeks.   TED hose   Complete by:  As directed    Use stockings (TED hose) for 2 weeks on both leg(s).  You may remove them at night for sleeping.   TED hose   Complete by:  As directed    Use stockings (TED hose) for 2 weeks on both leg(s).  You may remove them at night for sleeping.      Allergies as of 02/04/2018   No Known Allergies     Medication List    TAKE these medications   aspirin 81 MG chewable tablet Commonly known as:  ASPIRIN CHILDRENS Chew 1 tablet (81 mg total) by mouth 2 (two) times daily. Take for 4 weeks, then resume regular dose.   clomiPRAMINE 25 MG capsule Commonly known as:  ANAFRANIL Take 25 mg by mouth 3 (three) times daily.   diltiazem 240 MG 24 hr capsule Commonly known as:  DILACOR XR Take 240 mg by mouth daily.   docusate sodium 100 MG capsule Commonly known as:  COLACE Take 1 capsule (100 mg total) by mouth 2 (two) times daily.   ferrous sulfate 325 (65 FE) MG tablet Commonly known as:  FERROUSUL Take 1 tablet (325 mg total) by mouth 3 (three) times daily with meals.   hydrochlorothiazide 25 MG tablet Commonly known as:  HYDRODIURIL Take 25 mg by mouth daily.   HYDROcodone-acetaminophen 7.5-325 MG tablet Commonly known as:  NORCO Take 1-2  tablets by mouth every 4 (four) hours as needed for moderate pain.   HYDROcodone-acetaminophen 7.5-325 MG tablet Commonly known as:  NORCO Take 1-2 tablets by mouth every 4 (four) hours as needed for moderate pain.   losartan 25 MG tablet Commonly known as:  COZAAR Take 25 mg by mouth daily.   metFORMIN 500 MG 24 hr tablet Commonly known as:  GLUCOPHAGE-XR Take 500 mg by mouth daily with breakfast.   methocarbamol 500 MG tablet Commonly known as:  ROBAXIN Take 1 tablet (500 mg total) by mouth every 6 (six) hours as needed for muscle spasms.   methylphenidate 20 MG tablet Commonly known as:  RITALIN Take 20 mg by mouth daily as needed (narcolepsy).   omeprazole 40 MG capsule Commonly known as:  PRILOSEC Take 40 mg by mouth daily.   polyethylene glycol packet Commonly known as:  MIRALAX / GLYCOLAX Take 17 g by mouth 2 (two) times daily.   rosuvastatin 10 MG tablet Commonly known as:  CRESTOR Take 10 mg by mouth daily.   XYREM 500 MG/ML Soln Generic drug:  Sodium Oxybate Take 4.5 g by mouth 2 (two) times daily. During the night            Discharge Care Instructions  (From admission, onward)         Start     Ordered   02/04/18 0000  Change dressing    Comments:  Maintain surgical dressing until follow up in the clinic. If the edges start to pull up, may reinforce with tape. If the dressing is no longer working, may remove and cover with gauze and tape, but must keep the area dry and clean.  Call with any questions or concerns.   02/04/18 1053   02/03/18 0000  Change dressing    Comments:  Maintain surgical dressing until follow up in the clinic. If the edges start to pull up, may reinforce  with tape. If the dressing is no longer working, may remove and cover with gauze and tape, but must keep the area dry and clean.  Call with any questions or concerns.   02/03/18 [id number]           Signed: Anastasio AuerbachMatthew S. Eulalia Ellerman   PA-C  02/12/2018, 2:01 PM

## 2018-04-14 ENCOUNTER — Ambulatory Visit (INDEPENDENT_AMBULATORY_CARE_PROVIDER_SITE_OTHER): Payer: Medicare Other | Admitting: Neurology

## 2018-04-14 ENCOUNTER — Encounter: Payer: Self-pay | Admitting: Neurology

## 2018-04-14 VITALS — BP 143/84 | HR 84 | Ht 72.0 in | Wt 197.0 lb

## 2018-04-14 DIAGNOSIS — G4733 Obstructive sleep apnea (adult) (pediatric): Secondary | ICD-10-CM

## 2018-04-14 DIAGNOSIS — G47411 Narcolepsy with cataplexy: Secondary | ICD-10-CM

## 2018-04-14 NOTE — Progress Notes (Signed)
Subjective:    Patient ID: Patrick Lam is a 73 y.o. male.  HPI     Patrick Foley, MD, PhD [facility] [address] [address] [place], [place] [address]  Dear Dr. 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 The patient is accompanied by his daughter today. 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 He has been followed by the sleep clinic at [facility] since last year and prior to that he transferred care from [place] as he relocated. For narcolepsy he has been on Xyrem and methylphenidate, for cataplexy has been on clomipramine. He has had some weight gain of the past years, he has had some snoring. He had a recent diagnostic sleep study through [facility] on 07/06/2017 and I reviewed the results. Epworth sleepiness score was 10 out of 24 at the time. Of note, he was on clomipramine, methylphenidate and Xyrem at the time. Sleep efficiency was 55%, wake after sleep onset 116.5 minutes. He had stage I and stage II sleep, absence of slow-wave sleep and absence of REM sleep. Total AHI was 6 per hour average oxygen saturation was 91%, nadir was 81%. He had no significant PLMS. As I understand, he was advised to proceed with treatment of his mild obstructive sleep apnea but he declined. He was not prescribed Xyrem by the sleep physician at [facility] from what he is saying. It was last prescribed in [place] and he reports that he is still taking 9 g daily  in 2 doses. His original sleep study results are not available for my review today. I reviewed your office note from 03/01/2018. He has been on Ritalin 20 mg daily. He is on clomipramine 25 mg 3 times a day. He was on hydrocodone at the time of his knee replacement surgery in December 2019 but stopped it about a month ago. Since he stopped the hydrocodone and restarted the Xyrem he has not been sleeping very well. He takes Xyrem every night, he sleeps about an average of 4 hours he estimates. Prior to that he was able to sleep 6-7 hours on average. His daughter reports that he may or may not be able to taper off his medications. He is going to run OUT of Xyrem in the next 2 months. He is seeking guidance for tapering it. I did explain to him that I will not be able to prescribe it until I can myself can justify using Xyrem in his case, as this is high risk drug. I would be happy to review records from [place], I did review his sleep study from [facility] from last year. His daughter is concerned that he will have seizures if he tapers off of clomipramine. She reports that she has witnessed where he falls to the ground and has uncontrollable shaking. This is not a very typical presentation of cataplectic events. He denies a diagnosis of seizures or epilepsy. They are encouraged to talk to about safety taper of his Ritalin and clomipramine in preparation for sleep study testing  for narcolepsy evaluation.  His Epworth sleepiness score is 15 out of 24 today, fatigue score is 37 out of 63. He lives with his wife, they have 1 child. He smokes 5 cigarettes per day on average, drinks alcohol occasionally, drinks quite a bit of caffeine in the form of coffee, 6 cups per day on average.  His Past Medical History Is Significant For: Past Medical History:  Diagnosis Date  . Arthritis   . Hypertension   . Narcolepsy and cataplexy   . Pre-diabetes     His Past Surgical History Is Significant For: Past Surgical  History:  Procedure Laterality Date  . MOUTH SURGERY    . TOTAL KNEE ARTHROPLASTY Left 02/02/2018   Procedure: TOTAL KNEE ARTHROPLASTY;  Surgeon: Durene Romans, MD;  Location: [facility];  Service: Orthopedics;  Laterality: Left;     His Family History Is Significant For: No family history on file.  His Social History Is Significant For: Social History   Socioeconomic History  . Marital status: Married    Spouse name: Not on file  . Number of children: Not on file  . Years of education: Not on file  . Highest education level: Not on file  Occupational History  . Not on file  Social Needs  . Financial resource strain: Not on file  . Food insecurity:    Worry: Not on file    Inability: Not on file  . Transportation needs:    Medical: Not on file    Non-medical: Not on file  Tobacco Use  . Smoking status: Current Every Day Smoker    Packs/day: 0.00    Years: 15.00    Pack years: 0.00    Types: Cigars  . Smokeless tobacco: Never Used  . Tobacco comment: 2 cigars a day  Substance and Sexual Activity  . Alcohol use: Never    Frequency: Never  . Drug use: Never  . Sexual activity: Not on file  Lifestyle  . Physical activity:    Days per week: Not on file    Minutes per session: Not on file  . Stress: Not on file  Relationships  . Social connections:    Talks on phone: Not on file    Gets together: Not on file    Attends religious service: Not on file    Active member of club or organization: Not on file    Attends meetings of clubs or organizations: Not on file    Relationship status: Not on file  Other Topics Concern  . Not on file  Social History Narrative  . Not on file    His Allergies Are:  No Known Allergies:   His Current Medications Are:  Outpatient Encounter Medications as of 04/14/2018  Medication Sig  . clomiPRAMINE (ANAFRANIL) 25 MG capsule Take 25 mg by mouth 3 (three) times daily.   Marland Kitchen diltiazem (DILACOR XR) 240 MG 24 hr capsule Take 240 mg  by mouth daily.  . hydrochlorothiazide (HYDRODIURIL) 25 MG tablet Take 25 mg by mouth daily.  Marland Kitchen losartan (COZAAR) 25 MG tablet Take 25 mg by mouth daily.  . metFORMIN (GLUCOPHAGE-XR) 500 MG 24 hr tablet Take 500 mg by mouth daily with breakfast.  . methylphenidate (RITALIN) 20 MG tablet Take 20 mg by mouth daily as needed (narcolepsy).  Marland Kitchen omeprazole (PRILOSEC) 40 MG capsule Take 40 mg by mouth daily.  . rosuvastatin (CRESTOR) 10 MG tablet Take 10 mg by mouth daily.  . Sodium Oxybate (XYREM) 500 MG/ML  SOLN Take 4.5 g by mouth 2 (two) times daily. During the night  . [DISCONTINUED] docusate sodium (COLACE) 100 MG capsule Take 1 capsule (100 mg total) by mouth 2 (two) times daily.  . [DISCONTINUED] ferrous sulfate (FERROUSUL) 325 (65 FE) MG tablet Take 1 tablet (325 mg total) by mouth 3 (three) times daily with meals.  . [DISCONTINUED] HYDROcodone-acetaminophen (NORCO) 7.5-325 MG tablet Take 1-2 tablets by mouth every 4 (four) hours as needed for moderate pain.  . [DISCONTINUED] HYDROcodone-acetaminophen (NORCO) 7.5-325 MG tablet Take 1-2 tablets by mouth every 4 (four) hours as needed for moderate pain.  . [DISCONTINUED] methocarbamol (ROBAXIN) 500 MG tablet Take 1 tablet (500 mg total) by mouth every 6 (six) hours as needed for muscle spasms.  . [DISCONTINUED] polyethylene glycol (MIRALAX / GLYCOLAX) packet Take 17 g by mouth 2 (two) times daily.   No facility-administered encounter medications on file as of 04/14/2018.   :  Review of Systems:  Out of a complete 14 point review of systems, all are reviewed and negative with the exception of these symptoms as listed below:  Review of Systems  Neurological:       Pt presents today to discuss his narcolepsy. Pt had a recent knee replacement for which he used norco for pain control. After his knee surgery, he only gets about 2 hours of sleep. He has been taking xyrem and wants to know if there is an interaction with it and norco that could be causing  his sleeping problems.  Epworth Sleepiness Scale 0= would never doze 1= slight chance of dozing 2= moderate chance of dozing 3= high chance of dozing  Sitting and reading: 3 Watching TV: 2 Sitting inactive in a public place (ex. Theater or meeting): 2 As a passenger in a car for an hour without a break: 3 Lying down to rest in the afternoon: 3 Sitting and talking to someone: 0 Sitting quietly after lunch (no alcohol): 2 In a car, while stopped in traffic: 0 Total: 15     Objective:  Neurological Exam  Physical Exam Physical Examination:   Vitals:   04/14/18 1538  BP: (!) 143/84  Pulse: 84    General Examination: The patient is a very pleasant 73 y.o. male in no acute distress. He appears well-developed and well-nourished and well groomed.   HEENT: Normocephalic, atraumatic, pupils are equal, round and reactive to light and accommodation. Corrective eyeglasses in place. MRA examination reveals moderate mouth dryness and moderate airway crowding. Tongue protrudes centrally and palate elevates symmetrically. Face is symmetric, speech is clear.  Chest: Clear to auscultation without wheezing, rhonchi or crackles noted.  Heart: S1+S2+0, regular and normal without murmurs, rubs or gallops noted.   Abdomen: Soft, non-tender and non-distended with normal bowel sounds appreciated on auscultation.  Extremities: There is no obvious edema.  Skin: Warm and dry without trophic changes noted.   Musculoskeletal: exam reveals no obvious joint deformities, tenderness or joint swelling or erythema.   Neurologically:  Mental status: The patient is awake, alert and oriented in all 4 spheres. He is able to provide history. His daughter provides additional information. Cranial nerves II - XII are as described above under HEENT exam.  Motor exam: Normal bulk, normal global strength noted, no one-sided weakness noted, no tremor, fine motor skills are grossly intact. Gait, station and balance  appear unremarkable. Cerebellar testing: No dysmetria or intention tremor. There is no truncal or gait ataxia.  Sensory exam: intact to light touch.  Assessment and  Plan:  In summary, Broadus Pound is a very pleasant 73 y.o.-year old male with an underlying medical history of prediabetes, hypertension, arthritis with status post recent left total knee arthroplasty in December 2019, and overweight state, who presents for evaluation and second opinion of his narcolepsy with cataplexy diagnosis of over 20 years duration is understand. Prior sleep study records from his original diagnosis are not available for my review today. His daughter will try to get them from [place]. He reports that he went to [facility] to the sleep doctor but only saw them once. He reports that they did not believe he has narcolepsy and that they wanted to treat him for sleep apnea. Unfortunately, this is a difficult situation for me to come in. First of all I do not have any prior sleep study records available for my review regarding his narcolepsy diagnosis. He has been taking Xyrem from his original prescriber as I understand from [place] but he will run out in the next month or 2. Rather than running out, he is advised to safely try to taper off this medication. He is advised that I will not take over a controlled substance prescription for him until I can justify for myself that he is the right candidate for Xyrem and/or a stimulant. To that effect, he is given guidance as to how to taper off the Xyrem. He is advised that we will reverse simply, how we typically titrate Xyrem. He was given written instructions for this. He is furthermore advised that I will not necessarily be able to prescribe Xyrem based on previous records if they are not complete or satisfactory. He is advised that he was sent here for reevaluation for his narcolepsy and that I would be happy to order sleep study testing in that regard. For reliable sleep study  testing for narcolepsy he would have to taper his Xyrem, be off of any narcotic pain medication, be off of Ritalin, and also be off of clomipramine. He is advised to talk to about safely tapering the clomipramine and Ritalin. He is given instructions how to taper off the Xyrem. He also reports that the Xyrem has not been effective. Of note, he is on the maximum dose from what I understand. He reports taking 9 g daily in 2 divided doses which is the maximum dose. Should he have any acute symptoms such as one-sided weakness, numbness, fall, seizure-like event, collapse, or severe headache, they strongly advised to go to the emergency room or call 911. The daughter will try to get records for my review. I would be happy to review his original sleep documents and office records. He is furthermore advised that should his nighttime sleep study show obstructive sleep apnea I would recommend that he be treated for OSA first. I do believe that he was offered AutoPap therapy for his mild sleep apnea based on his sleep study from last year, but declined autoPAP. I am not sure if he will be approved for yet another sleep study. I will request authorization for a nocturnal polysomnogram and next day MSLT.  For now, we will seek insurance authorization for sleep study testing and get in touch with him afterwards. In the meantime, his daughter will try to get records for me. We will keep our follow-up open for now. He is advised to follow-up with you as scheduled. I answered all their questions today and the patient and his daughter demonstrated understanding and voiced agreement.  Thank you very much for allowing  me to participate in the care of this nice patient. If I can be of any further assistance to you please do not hesitate to call me at [phone].  Sincerely,   Star Age, MD, PhD

## 2018-04-14 NOTE — Patient Instructions (Addendum)
Please gradually taper off the Xyrem. It was my understanding that you are no longer on it. 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 Please talk to the prescribing physician about how to taper off of it. If this is not possible, please reduce your dose to 3.7 g twice nightly for 1 week, then 3 g twice nightly for 1 week, then 2.25 g twice nightly for 1 week, then 2.25 g once nightly for 1 week, then stop.  As discussed, I will not take over your prescriptions as of right now. I may or may not be able to prescribe Xyrem at a later date, especially if you don't qualify for it. I would be happy to review previous sleep study records from when you were diagnosed with narcolepsy several years ago. Please provide prior sleep records at your next convenience. Please talk to Dr. Roney Mans about your other prescriptions.  If you have any acute symptoms, such as fall, lightheadedness, one-sided weakness or numbness or severe headache, please call 911 or go to the emergency room.  I will request insurance authorization for a nighttime sleep study, followed by a daytime nap study. In order to proceed with sleep study testing, you would have to talk to Dr. Roney Mans about tapering off the Ritalin and clomipramine. You would have to be off of Xyrem, Ritalin, and clomipramine for at least 2 weeks prior to sleep study testing.   If your nighttime sleep study shows obstructive sleep apnea I will recommend that we treat your obstructive sleep apnea with CPAP or AutoPAP.

## 2018-07-06 ENCOUNTER — Telehealth: Payer: Self-pay

## 2018-07-06 NOTE — Telephone Encounter (Signed)
Called pt to let him know that our sleep lab has opened back up and asked if he was ready to schedule his NPSG/MSLT. Pt declined scheduling at this time and is not interested in scheduling at a later date. Asked him to call us back if or when he changed his mind.

## 2019-03-14 ENCOUNTER — Ambulatory Visit: Payer: Medicare Other | Attending: Internal Medicine

## 2019-03-14 DIAGNOSIS — Z23 Encounter for immunization: Secondary | ICD-10-CM | POA: Insufficient documentation

## 2019-03-14 NOTE — Progress Notes (Signed)
   Covid-19 Vaccination Clinic  Name:  Patrick Lam    MRN: [id number] DOB: [birth year]  03/14/2019  Patrick Lam was observed post Covid-19 immunization for 15 minutes without incidence. He was provided with Vaccine Information Sheet and instruction to access the V-Safe system.   Patrick Lam was instructed to call 911 with any severe reactions post vaccine: Marland Kitchen Difficulty breathing  . Swelling of your face and throat  . A fast heartbeat  . A bad rash all over your body  . Dizziness and weakness    Immunizations Administered    Name Date Dose VIS Date Route   Pfizer COVID-19 Vaccine 03/14/2019 11:59 AM 0.3 mL 01/28/2019 Intramuscular   Manufacturer: [employer], [employer]   Lot: [id number]   NDC: [address]-4

## 2019-04-04 ENCOUNTER — Ambulatory Visit: Payer: Medicare Other | Attending: Internal Medicine

## 2019-04-04 DIAGNOSIS — Z23 Encounter for immunization: Secondary | ICD-10-CM

## 2019-04-04 NOTE — Progress Notes (Signed)
   Covid-19 Vaccination Clinic  Name:  Patrick Lam    MRN: [id number] DOB: [birth year]  04/04/2019  Patrick Lam was observed post Covid-19 immunization for 15 minutes without incidence. He was provided with Vaccine Information Sheet and instruction to access the V-Safe system.   Patrick Lam was instructed to call 911 with any severe reactions post vaccine: Marland Kitchen Difficulty breathing  . Swelling of your face and throat  . A fast heartbeat  . A bad rash all over your body  . Dizziness and weakness    Immunizations Administered    Name Date Dose VIS Date Route   Pfizer COVID-19 Vaccine 04/04/2019 11:48 AM 0.3 mL 01/28/2019 Intramuscular   Manufacturer: [employer], [employer]   Lot: [id number]   NDC: [address]-7

## 2020-01-24 ENCOUNTER — Emergency Department (HOSPITAL_COMMUNITY): Payer: Medicare Other | Admitting: Certified Registered Nurse Anesthetist

## 2020-01-24 ENCOUNTER — Encounter (HOSPITAL_COMMUNITY): Admission: EM | Disposition: A | Discharge status: Skilled Nursing Facility | Payer: Self-pay | Source: Home / Self Care

## 2020-01-24 ENCOUNTER — Emergency Department (HOSPITAL_COMMUNITY): Payer: Medicare Other

## 2020-01-24 ENCOUNTER — Encounter (HOSPITAL_COMMUNITY): Payer: Self-pay | Admitting: *Deleted

## 2020-01-24 ENCOUNTER — Inpatient Hospital Stay: Admit: 2020-01-24 | Payer: Medicare Other | Admitting: Surgery

## 2020-01-24 ENCOUNTER — Inpatient Hospital Stay (HOSPITAL_COMMUNITY)
Admission: EM | Admit: 2020-01-24 | Discharge: 2020-02-15 | DRG: 335 | Disposition: A | Discharge status: Skilled Nursing Facility | Payer: Medicare Other | Attending: General Surgery | Admitting: General Surgery

## 2020-01-24 DIAGNOSIS — M1990 Unspecified osteoarthritis, unspecified site: Secondary | ICD-10-CM | POA: Diagnosis present

## 2020-01-24 DIAGNOSIS — R0902 Hypoxemia: Secondary | ICD-10-CM

## 2020-01-24 DIAGNOSIS — K567 Ileus, unspecified: Secondary | ICD-10-CM

## 2020-01-24 DIAGNOSIS — Z978 Presence of other specified devices: Secondary | ICD-10-CM

## 2020-01-24 DIAGNOSIS — J9600 Acute respiratory failure, unspecified whether with hypoxia or hypercapnia: Secondary | ICD-10-CM

## 2020-01-24 DIAGNOSIS — R339 Retention of urine, unspecified: Secondary | ICD-10-CM | POA: Diagnosis not present

## 2020-01-24 DIAGNOSIS — F17290 Nicotine dependence, other tobacco product, uncomplicated: Secondary | ICD-10-CM | POA: Diagnosis present

## 2020-01-24 DIAGNOSIS — R579 Shock, unspecified: Secondary | ICD-10-CM | POA: Diagnosis not present

## 2020-01-24 DIAGNOSIS — K558 Other vascular disorders of intestine: Secondary | ICD-10-CM | POA: Diagnosis present

## 2020-01-24 DIAGNOSIS — R578 Other shock: Secondary | ICD-10-CM | POA: Diagnosis not present

## 2020-01-24 DIAGNOSIS — K4030 Unilateral inguinal hernia, with obstruction, without gangrene, not specified as recurrent: Secondary | ICD-10-CM | POA: Diagnosis present

## 2020-01-24 DIAGNOSIS — K56609 Unspecified intestinal obstruction, unspecified as to partial versus complete obstruction: Secondary | ICD-10-CM

## 2020-01-24 DIAGNOSIS — E1165 Type 2 diabetes mellitus with hyperglycemia: Secondary | ICD-10-CM | POA: Diagnosis present

## 2020-01-24 DIAGNOSIS — G47419 Narcolepsy without cataplexy: Secondary | ICD-10-CM | POA: Diagnosis present

## 2020-01-24 DIAGNOSIS — K660 Peritoneal adhesions (postprocedural) (postinfection): Secondary | ICD-10-CM | POA: Diagnosis present

## 2020-01-24 DIAGNOSIS — Z96652 Presence of left artificial knee joint: Secondary | ICD-10-CM | POA: Diagnosis present

## 2020-01-24 DIAGNOSIS — K913 Postprocedural intestinal obstruction, unspecified as to partial versus complete: Secondary | ICD-10-CM | POA: Diagnosis not present

## 2020-01-24 DIAGNOSIS — Z7984 Long term (current) use of oral hypoglycemic drugs: Secondary | ICD-10-CM | POA: Diagnosis not present

## 2020-01-24 DIAGNOSIS — K9189 Other postprocedural complications and disorders of digestive system: Secondary | ICD-10-CM

## 2020-01-24 DIAGNOSIS — J9601 Acute respiratory failure with hypoxia: Secondary | ICD-10-CM | POA: Diagnosis not present

## 2020-01-24 DIAGNOSIS — E876 Hypokalemia: Secondary | ICD-10-CM | POA: Diagnosis not present

## 2020-01-24 DIAGNOSIS — K219 Gastro-esophageal reflux disease without esophagitis: Secondary | ICD-10-CM | POA: Diagnosis present

## 2020-01-24 DIAGNOSIS — Z20822 Contact with and (suspected) exposure to covid-19: Secondary | ICD-10-CM | POA: Diagnosis present

## 2020-01-24 DIAGNOSIS — I10 Essential (primary) hypertension: Secondary | ICD-10-CM | POA: Diagnosis present

## 2020-01-24 DIAGNOSIS — Z6823 Body mass index (BMI) 23.0-23.9, adult: Secondary | ICD-10-CM

## 2020-01-24 DIAGNOSIS — J479 Bronchiectasis, uncomplicated: Secondary | ICD-10-CM | POA: Diagnosis present

## 2020-01-24 DIAGNOSIS — E440 Moderate protein-calorie malnutrition: Secondary | ICD-10-CM | POA: Diagnosis not present

## 2020-01-24 HISTORY — PX: INGUINAL HERNIA REPAIR: SHX194

## 2020-01-24 LAB — COMPREHENSIVE METABOLIC PANEL
ALT: 19 U/L (ref 0–44)
AST: 24 U/L (ref 15–41)
Albumin: 4.2 g/dL (ref 3.5–5.0)
Alkaline Phosphatase: 49 U/L (ref 38–126)
Anion gap: 12 (ref 5–15)
BUN: 16 mg/dL (ref 8–23)
CO2: 25 mmol/L (ref 22–32)
Calcium: 9 mg/dL (ref 8.9–10.3)
Chloride: 95 mmol/L — ABNORMAL LOW (ref 98–111)
Creatinine, Ser: 0.85 mg/dL (ref 0.61–1.24)
GFR, Estimated: >60 mL/min (ref >60)
Glucose, Bld: 134 mg/dL — ABNORMAL HIGH (ref 70–99)
Potassium: 4 mmol/L (ref 3.5–5.1)
Sodium: 132 mmol/L — ABNORMAL LOW (ref 135–145)
Total Bilirubin: 0.7 mg/dL (ref 0.3–1.2)
Total Protein: 7.7 g/dL (ref 6.5–8.1)

## 2020-01-24 LAB — I-STAT CHEM 8, ED
BUN: 15 mg/dL (ref 8–23)
Calcium, Ion: 1.1 mmol/L — ABNORMAL LOW (ref 1.15–1.40)
Chloride: 95 mmol/L — ABNORMAL LOW (ref 98–111)
Creatinine, Ser: 0.8 mg/dL (ref 0.61–1.24)
Glucose, Bld: 133 mg/dL — ABNORMAL HIGH (ref 70–99)
HCT: 53 % — ABNORMAL HIGH (ref 39.0–52.0)
Hemoglobin: 18 g/dL — ABNORMAL HIGH (ref 13.0–17.0)
Potassium: 4 mmol/L (ref 3.5–5.1)
Sodium: 134 mmol/L — ABNORMAL LOW (ref 135–145)
TCO2: 27 mmol/L (ref 22–32)

## 2020-01-24 LAB — TYPE AND SCREEN
ABO/RH(D): A POS
Antibody Screen: NEGATIVE

## 2020-01-24 LAB — CBC WITH DIFFERENTIAL/PLATELET
Abs Immature Granulocytes: 0.09 10*3/uL — ABNORMAL HIGH (ref 0.00–0.07)
Basophils Absolute: 0.1 10*3/uL (ref 0.0–0.1)
Basophils Relative: 0 %
Eosinophils Absolute: 0 10*3/uL (ref 0.0–0.5)
Eosinophils Relative: 0 %
HCT: 49 % (ref 39.0–52.0)
Hemoglobin: 16.2 g/dL (ref 13.0–17.0)
Immature Granulocytes: 1 %
Lymphocytes Relative: 7 %
Lymphs Abs: 1 10*3/uL (ref 0.7–4.0)
MCH: 24.9 pg — ABNORMAL LOW (ref 26.0–34.0)
MCHC: 33.1 g/dL (ref 30.0–36.0)
MCV: 75.4 fL — ABNORMAL LOW (ref 80.0–100.0)
Monocytes Absolute: 0.8 10*3/uL (ref 0.1–1.0)
Monocytes Relative: 6 %
Neutro Abs: 12.7 10*3/uL — ABNORMAL HIGH (ref 1.7–7.7)
Neutrophils Relative %: 86 %
Platelets: 293 10*3/uL (ref 150–400)
RBC: 6.5 MIL/uL — ABNORMAL HIGH (ref 4.22–5.81)
RDW: 17 % — ABNORMAL HIGH (ref 11.5–15.5)
WBC: 14.7 10*3/uL — ABNORMAL HIGH (ref 4.0–10.5)
nRBC: 0 % (ref 0.0–0.2)

## 2020-01-24 LAB — RESP PANEL BY RT-PCR (FLU A&B, COVID) ARPGX2
Influenza A by PCR: NEGATIVE
Influenza B by PCR: NEGATIVE
SARS Coronavirus 2 by RT PCR: NEGATIVE

## 2020-01-24 LAB — LACTIC ACID, PLASMA: Lactic Acid, Venous: 1.2 mmol/L (ref 0.5–1.9)

## 2020-01-24 LAB — URINALYSIS, ROUTINE W REFLEX MICROSCOPIC
Bilirubin Urine: NEGATIVE
Glucose, UA: NEGATIVE mg/dL
Hgb urine dipstick: NEGATIVE
Ketones, ur: 20 mg/dL — AB
Leukocytes,Ua: NEGATIVE
Nitrite: NEGATIVE
Protein, ur: NEGATIVE mg/dL
Specific Gravity, Urine: 1.013 (ref 1.005–1.030)
pH: 8 (ref 5.0–8.0)

## 2020-01-24 LAB — GLUCOSE, CAPILLARY: Glucose-Capillary: 167 mg/dL — ABNORMAL HIGH (ref 70–99)

## 2020-01-24 LAB — PROTIME-INR
INR: 1.1 (ref 0.8–1.2)
Prothrombin Time: 13.4 seconds (ref 11.4–15.2)

## 2020-01-24 SURGERY — REPAIR, HERNIA, INGUINAL, INCARCERATED
Anesthesia: General | Laterality: Right

## 2020-01-24 MED ORDER — CEFAZOLIN SODIUM-DEXTROSE 2-4 GM/100ML-% IV SOLN
INTRAVENOUS | Status: AC
  Filled 2020-01-24: qty 100

## 2020-01-24 MED ORDER — ACETAMINOPHEN 325 MG PO TABS
ORAL_TABLET | ORAL | Status: DC | PRN
  Administered 2020-01-24: 1000 mg via ORAL

## 2020-01-24 MED ORDER — FENTANYL CITRATE (PF) 100 MCG/2ML IJ SOLN
INTRAMUSCULAR | Status: DC | PRN
  Administered 2020-01-24 (×3): 50 ug via INTRAVENOUS

## 2020-01-24 MED ORDER — BUPIVACAINE HCL (PF) 0.5 % IJ SOLN
INTRAMUSCULAR | Status: AC
  Filled 2020-01-24: qty 30

## 2020-01-24 MED ORDER — SUGAMMADEX SODIUM 200 MG/2ML IV SOLN
INTRAVENOUS | Status: DC | PRN
  Administered 2020-01-24: 200 mg via INTRAVENOUS

## 2020-01-24 MED ORDER — ONDANSETRON HCL 4 MG/2ML IJ SOLN: 4.0000 mg | Freq: Once | INTRAMUSCULAR | Status: DC | PRN

## 2020-01-24 MED ORDER — ROCURONIUM BROMIDE 10 MG/ML (PF) SYRINGE
PREFILLED_SYRINGE | INTRAVENOUS | Status: AC
  Filled 2020-01-24: qty 10

## 2020-01-24 MED ORDER — DEXAMETHASONE SODIUM PHOSPHATE 10 MG/ML IJ SOLN
INTRAMUSCULAR | Status: DC | PRN
  Administered 2020-01-24: 4 mg via INTRAVENOUS

## 2020-01-24 MED ORDER — SUCCINYLCHOLINE CHLORIDE 200 MG/10ML IV SOSY
PREFILLED_SYRINGE | INTRAVENOUS | Status: AC
  Filled 2020-01-24: qty 10

## 2020-01-24 MED ORDER — ONDANSETRON HCL 4 MG/2ML IJ SOLN
INTRAMUSCULAR | Status: AC
  Filled 2020-01-24: qty 2

## 2020-01-24 MED ORDER — LACTATED RINGERS IV SOLN: INTRAVENOUS | Status: DC

## 2020-01-24 MED ORDER — LACTATED RINGERS IV SOLN: INTRAVENOUS | Status: DC | PRN

## 2020-01-24 MED ORDER — FENTANYL CITRATE (PF) 250 MCG/5ML IJ SOLN
INTRAMUSCULAR | Status: AC
  Filled 2020-01-24: qty 5

## 2020-01-24 MED ORDER — PROPOFOL 10 MG/ML IV BOLUS
INTRAVENOUS | Status: AC
  Filled 2020-01-24: qty 20

## 2020-01-24 MED ORDER — CEFAZOLIN SODIUM-DEXTROSE 2-4 GM/100ML-% IV SOLN
2.0000 g | Freq: Once | INTRAVENOUS | Status: AC
  Administered 2020-01-24: 2 g via INTRAVENOUS

## 2020-01-24 MED ORDER — ROCURONIUM BROMIDE 10 MG/ML (PF) SYRINGE
PREFILLED_SYRINGE | INTRAVENOUS | Status: DC | PRN
  Administered 2020-01-24: 40 mg via INTRAVENOUS
  Administered 2020-01-24: 10 mg via INTRAVENOUS

## 2020-01-24 MED ORDER — PROPOFOL 10 MG/ML IV BOLUS
INTRAVENOUS | Status: DC | PRN
  Administered 2020-01-24: 140 mg via INTRAVENOUS

## 2020-01-24 MED ORDER — LIDOCAINE 2% (20 MG/ML) 5 ML SYRINGE
INTRAMUSCULAR | Status: DC | PRN
  Administered 2020-01-24: 80 mg via INTRAVENOUS

## 2020-01-24 MED ORDER — PHENYLEPHRINE HCL (PRESSORS) 10 MG/ML IV SOLN
INTRAVENOUS | Status: AC
  Filled 2020-01-24: qty 1

## 2020-01-24 MED ORDER — FENTANYL CITRATE (PF) 100 MCG/2ML IJ SOLN: 25.0000 ug | INTRAMUSCULAR | Status: DC | PRN

## 2020-01-24 MED ORDER — BUPIVACAINE HCL (PF) 0.5 % IJ SOLN
INTRAMUSCULAR | Status: DC | PRN
  Administered 2020-01-24: 20 mL

## 2020-01-24 MED ORDER — ONDANSETRON HCL 4 MG/2ML IJ SOLN
INTRAMUSCULAR | Status: DC | PRN
  Administered 2020-01-24: 4 mg via INTRAVENOUS

## 2020-01-24 MED ORDER — SUCCINYLCHOLINE CHLORIDE 200 MG/10ML IV SOSY
PREFILLED_SYRINGE | INTRAVENOUS | Status: DC | PRN
  Administered 2020-01-24: 120 mg via INTRAVENOUS

## 2020-01-24 MED ORDER — HYDROMORPHONE HCL 1 MG/ML IJ SOLN
1.0000 mg | Freq: Once | INTRAMUSCULAR | Status: AC
  Administered 2020-01-24: 1 mg via INTRAVENOUS
  Filled 2020-01-24: qty 1

## 2020-01-24 MED ORDER — ACETAMINOPHEN 10 MG/ML IV SOLN
INTRAVENOUS | Status: AC
  Filled 2020-01-24: qty 100

## 2020-01-24 SURGICAL SUPPLY — 22 items
CHLORAPREP W/TINT 26 (MISCELLANEOUS) ×3 IMPLANT
DRAIN PENROSE 0.5X18 (DRAIN) ×3 IMPLANT
DRAPE LAPAROSCOPIC ABDOMINAL (DRAPES) ×3 IMPLANT
DRAPE UTILITY XL STRL (DRAPES) ×3 IMPLANT
ELECT REM PT RETURN 15FT ADLT (MISCELLANEOUS) ×3 IMPLANT
GOWN STRL REUS W/TWL XL LVL3 (GOWN DISPOSABLE) ×6 IMPLANT
KIT BASIN OR (CUSTOM PROCEDURE TRAY) ×3 IMPLANT
KIT TURNOVER KIT A (KITS) ×3 IMPLANT
MANIFOLD NEPTUNE II (INSTRUMENTS) ×3 IMPLANT
MESH PARIETEX PROGRIP RIGHT (Mesh General) ×3 IMPLANT
NS IRRIG 1000ML POUR BTL (IV SOLUTION) ×3 IMPLANT
PACK GENERAL/GYN (CUSTOM PROCEDURE TRAY) ×3 IMPLANT
PENCIL SMOKE EVACUATOR COATED (MISCELLANEOUS) ×3 IMPLANT
SPONGE LAP 18X18 RF (DISPOSABLE) ×6 IMPLANT
SUT MNCRL AB 4-0 PS2 18 (SUTURE) ×3 IMPLANT
SUT SILK 2 0 SH CR/8 (SUTURE) ×3 IMPLANT
SUT SILK 3 0 SH CR/8 (SUTURE) ×3 IMPLANT
SUT VIC AB 2-0 CT1 27 (SUTURE) ×8
SUT VIC AB 2-0 CT1 27XBRD (SUTURE) ×4 IMPLANT
SUT VIC AB 3-0 SH 27 (SUTURE) ×2
SUT VIC AB 3-0 SH 27XBRD (SUTURE) ×1 IMPLANT
TOWEL OR 17X26 10 PK STRL BLUE (TOWEL DISPOSABLE) ×3 IMPLANT

## 2020-01-24 NOTE — Transfer of Care (Signed)
Immediate Anesthesia Transfer of Care Note  Patient: Nihar Klus  Procedure(s) Performed: REPAIR INGUINAL INCARCERATED HERNIA WITH MESH (Right )  Patient Location: PACU  Anesthesia Type:General  Level of Consciousness: drowsy and patient cooperative  Airway & Oxygen Therapy: Patient Spontanous Breathing and Patient connected to face mask oxygen  Post-op Assessment: Report given to RN and Post -op Vital signs reviewed and stable  Post vital signs: Reviewed and stable  Last Vitals:  Vitals Value Taken Time  BP 138/63 01/24/20 2246  Temp 37.3 C 01/24/20 2245  Pulse 72 01/24/20 2249  Resp 21 01/24/20 2249  SpO2 100 % 01/24/20 2249  Vitals shown include unvalidated device data.  Last Pain:  Vitals:   01/24/20 1852  PainSc: 10-Worst pain ever         Complications: No complications documented.

## 2020-01-24 NOTE — Op Note (Signed)
Patrick Lam 01/24/2020   Pre-op Diagnosis: incarcetated right inguinal herinia     Post-op Diagnosis: same  Procedure(s): Repair of incarcerated right inguinal hernia with mesh  Surgeon(s): Abigail Miyamoto, MD  Anesthesia: General  Staff:  Circulator: Dominga Ferry, RN Scrub Person: Raoul Pitch  Estimated Blood Loss: less than 50 mL               Indications: This is a 74 year old gentleman who had the sudden onset of abdominal pain earlier today.  He then noticed a large bulge in the right inguinal area.  He presented to the hospital.  Was confirmed to have an incarcerated hernia containing small bowel on a stat CT scan.  The decision was made to proceed emergently to the operating room.  He had no previous history of inguinal hernia  Findings: The patient had an incarcerated direct right inguinal hernia.  There was about 6 inches of near ischemic bowel which appeared viable once the tight neck of the hernia was opened up.  The testicular cord was pulled up into the area of the hernia and was quite edematous.  Procedure: The patient was brought to the operating room identifies correct patient.  He is placed upon the operating table general anesthesia was induced.  His abdomen was then prepped and draped in usual sterile fashion.  Again, the patient had a very large incarcerated hernia in the right lower abdomen.  I made a longitudinal incision with a scalpel and then took this down through Scarpa's fascia electrocautery.  This is already splayed open the medial aspect of the external oblique fascia which I opened more laterally.  The patient had a very thickened hernia sac.  I opened up the sac and was found to contain about 8 inches or so of small bowel.  About 6 inches was quite ischemic.  There was a very tight neck on the hernia sac which I was able to and was able to eviscerate small bowel proximal and distal.  There was no real inguinal floor remaining.  The  testicle and testicular cord on the right side were quite edematous and foreshortened and pulled up into the groin by the hernia sac.  I excised the surrounding sac.  I had to tie suture areas in the muscle to achieve hemostasis laterally after having to open up the area further.  I watched the small bowel for multiple minutes and it appeared to become less ischemic and appeared more viable.  At this point the decision was made to reduce it back into the abdominal cavity.  I was then able to close the peritoneum of the floor of the inguinal canal including some of the sac with a running 2-0 Vicryl suture.  As the floor was very weak and no further primary repair could be performed, I elected to place a piece of ProGrip mesh into the inguinal floor.  A right-sided piece of large mesh was used.  I placed it against the pubic tubercle and brought around the testicular cord.  I then sutured the mesh in place circumferentially with interrupted 2-0 Vicryl sutures.  This appeared to make the inguinal floor much more stable.  I then returned the testicle to the scrotum which was difficult given its foreshortening and edema.  I was then able to close the external beak fascia over the top of the mesh with a running 2-0 Vicryl suture.  I anesthetized the fascia and skin with Marcaine.  Hemostasis appeared to be achieved.  I then closed Scarpa's fascia with interrupted 3-0 Vicryl sutures and closed the skin with running 4-0 Monocryl.  Dermabond was then applied.  The patient tolerated the procedure well.  All the counts were correct at the end of the procedure.  The patient was then extubated in the operating room and taken in stable condition to the recovery room.          Abigail Miyamoto   Date: 01/24/2020  Time: 10:42 PM

## 2020-01-24 NOTE — ED Notes (Signed)
Pt to OR for procedure at this time.

## 2020-01-24 NOTE — Anesthesia Preprocedure Evaluation (Addendum)
Anesthesia Evaluation  Patient identified by MRN, date of birth, ID band Patient awake    Reviewed: Allergy & Precautions, NPO status , Patient's Chart, lab work & pertinent test results  Airway Mallampati: II  TM Distance: >3 FB Neck ROM: Full    Dental  (+) Teeth Intact   Pulmonary Current SmokerPatient did not abstain from smoking.,    Pulmonary exam normal breath sounds clear to auscultation       Cardiovascular hypertension, Pt. on medications Normal cardiovascular exam Rhythm:Regular Rate:Normal     Neuro/Psych Narcolepsy and cataplexy    GI/Hepatic Neg liver ROS, incarcetated herinia   Endo/Other  diabetes, Type 2, Oral Hypoglycemic Agents  Renal/GU negative Renal ROS     Musculoskeletal  (+) Arthritis ,   Abdominal   Peds  Hematology negative hematology ROS (+)   Anesthesia Other Findings Day of surgery medications reviewed with the patient.  Reproductive/Obstetrics                            Anesthesia Physical Anesthesia Plan  ASA: II and emergent  Anesthesia Plan: General   Post-op Pain Management:    Induction: Intravenous and Rapid sequence  PONV Risk Score and Plan: 3 and Ondansetron, Dexamethasone and Treatment may vary due to age or medical condition  Airway Management Planned: Oral ETT  Additional Equipment:   Intra-op Plan:   Post-operative Plan: Extubation in OR  Informed Consent: I have reviewed the patients History and Physical, chart, labs and discussed the procedure including the risks, benefits and alternatives for the proposed anesthesia with the patient or authorized representative who has indicated his/her understanding and acceptance.       Plan Discussed with: CRNA and Surgeon  Anesthesia Plan Comments:         Anesthesia Quick Evaluation

## 2020-01-24 NOTE — ED Provider Notes (Signed)
[facility]-EMERGENCY DEPT Provider Note   CSN: [id number] Arrival date & time: 01/24/20  1825     History No chief complaint on file.   Patrick Lam is a 74 y.o. male with past medical history of hypertension, narcolepsy, arthritis that presents the emergency department today for abdominal pain.  Brought in by EMS.  Patient states that around 11 AM he started having abdominal pain around his bellybutton that started radiate to his right side of his groin and testicles.  Has not been able to urinate since this morning.  Feels a large pressure with a large bulging on the right side of his abdomen/pelvis.  No true scrotal pain or scrotal pelvic swelling.  No penile pain or penile discharge.  Patient states he was normal health before this.  Has not take anything for this.  Was given 200 fentanyl with no pain relief via EMS.  Denies any nausea or vomiting.  Denies any fevers, chills.  His never had hernia before.  States that the pain is severe, 10 on 10.  No back pain, chest pain shortness of breath.  Has not had anything to eat or drink since this morning.  No blood thinners.  HPI     Past Medical History:  Diagnosis Date  . Arthritis   . Hypertension   . Narcolepsy and cataplexy   . Pre-diabetes     Patient Active Problem List   Diagnosis Date Noted  . Overweight (BMI 25.0-29.9) 02/03/2018  . 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 MOUTH SURGERY    . TOTAL KNEE ARTHROPLASTY Left 02/02/2018   Procedure: TOTAL KNEE ARTHROPLASTY;  Surgeon: Durene Romanslin, Matthew, MD;  Location: [facility] ORS;  Service: Orthopedics;  Laterality: Left;  70min       History reviewed. No pertinent family history.  Social History   Tobacco Use  . Smoking status: Current Every Day Smoker    Packs/day: 0.00    Years: 15.00    Pack years: 0.00    Types: Cigars  . Smokeless tobacco: Never Used  . Tobacco comment: 2 cigars a day  Vaping Use  .  Vaping Use: Never used  Substance Use Topics  . Alcohol use: Never  . Drug use: Never    Home Medications Prior to Admission medications   Medication Sig Start Date End Date Taking? Authorizing Provider  clomiPRAMINE (ANAFRANIL) 25 MG capsule Take 25 mg by mouth 3 (three) times daily.     [provider]  diltiazem (DILACOR XR) 240 MG 24 hr capsule Take 240 mg by mouth daily.    [provider]  hydrochlorothiazide (HYDRODIURIL) 25 MG tablet Take 25 mg by mouth daily.    [provider]  losartan (COZAAR) 25 MG tablet Take 25 mg by mouth daily.    [provider]  metFORMIN (GLUCOPHAGE-XR) 500 MG 24 hr tablet Take 500 mg by mouth daily with breakfast.    [provider]  methylphenidate (RITALIN) 20 MG tablet Take 20 mg by mouth daily as needed (narcolepsy).    [provider]  omeprazole (PRILOSEC) 40 MG capsule Take 40 mg by mouth daily.    [provider]  rosuvastatin (CRESTOR) 10 MG tablet Take 10 mg by mouth daily.    [provider]  Sodium Oxybate (XYREM) 500 MG/ML SOLN Take 4.5 g by mouth 2 (two) times daily. During the night    [provider]    Allergies  Patient has no known allergies.  Review of Systems   Review of Systems  Constitutional: Negative for chills, diaphoresis, fatigue and fever.  HENT: Negative for congestion, sore throat and trouble swallowing.   Eyes: Negative for pain and visual disturbance.  Respiratory: Negative for cough, shortness of breath and wheezing.   Cardiovascular: Negative for chest pain, palpitations and leg swelling.  Gastrointestinal: Positive for abdominal pain. Negative for abdominal distention, diarrhea, nausea and vomiting.  Genitourinary: Negative for difficulty urinating.  Musculoskeletal: Negative for back pain, neck pain and neck stiffness.  Skin: Negative for pallor.  Neurological: Negative for dizziness, speech difficulty, weakness and headaches.   Psychiatric/Behavioral: Negative for confusion.    Physical Exam Updated Vital Signs BP (!) 156/70   Physical Exam Exam conducted with a chaperone present.  Constitutional:      General: He is in acute distress.     Appearance: Normal appearance. He is ill-appearing. He is not toxic-appearing or diaphoretic.     Comments: Patient is in acute distress, writhing in pain.  HENT:     Mouth/Throat:     Mouth: Mucous membranes are moist.     Pharynx: Oropharynx is clear.  Eyes:     General: No scleral icterus.    Extraocular Movements: Extraocular movements intact.     Pupils: Pupils are equal, round, and reactive to light.  Cardiovascular:     Rate and Rhythm: Normal rate and regular rhythm.     Pulses: Normal pulses.     Heart sounds: Normal heart sounds.  Pulmonary:     Effort: Pulmonary effort is normal. No respiratory distress.     Breath sounds: Normal breath sounds. No stridor. No wheezing, rhonchi or rales.  Chest:     Chest wall: No tenderness.  Abdominal:     General: Abdomen is flat. There is no distension.     Palpations: Abdomen is soft.     Tenderness: There is no abdominal tenderness. There is no guarding or rebound.     Hernia: A hernia is present. Hernia is present in the right inguinal area.  Genitourinary:    Penis: Normal.      Testes: Normal.       Comments: Chaperone present.  Large large incarcerated inguinal hernia on the right side.  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  No erythema or warmth noted in this area.  No tenderness to scrotum, no erythema to scrotum.  Penis normal.  Musculoskeletal:        General: No swelling or tenderness. Normal range of motion.     Cervical back: Normal range of motion and neck supple. No rigidity.     Right lower leg: No edema.     Left lower leg: No edema.  Skin:    General: Skin is warm and dry.     Capillary Refill: Capillary refill takes less than 2 seconds.     Coloration: Skin is  not pale.  Neurological:     General: No focal deficit present.     Mental Status: He is alert and oriented to person, place, and time.  Psychiatric:        Mood and Affect: Mood normal.        Behavior: Behavior normal.     ED Results / Procedures / Treatments   Labs (all labs ordered are listed, but only abnormal results are displayed) Labs Reviewed  COMPREHENSIVE METABOLIC PANEL - Abnormal; Notable for the following components:  Result Value   Sodium 132 (*)    Chloride 95 (*)    Glucose, Bld 134 (*)    All other components within normal limits  CBC WITH DIFFERENTIAL/PLATELET - Abnormal; Notable for the following components:   WBC 14.7 (*)    RBC 6.50 (*)    MCV 75.4 (*)    MCH 24.9 (*)    RDW 17.0 (*)    Neutro Abs 12.7 (*)    Abs Immature Granulocytes 0.09 (*)    All other components within normal limits  URINALYSIS, ROUTINE W REFLEX MICROSCOPIC - Abnormal; Notable for the following components:   APPearance CLOUDY (*)    Ketones, ur 20 (*)    All other components within normal limits  I-STAT CHEM 8, ED - Abnormal; Notable for the following components:   Sodium 134 (*)    Chloride 95 (*)    Glucose, Bld 133 (*)    Calcium, Ion 1.10 (*)    Hemoglobin 18.0 (*)    HCT 53.0 (*)    All other components within normal limits  RESP PANEL BY RT-PCR (FLU A&B, COVID) ARPGX2  LACTIC ACID, PLASMA  PROTIME-INR  LACTIC ACID, PLASMA  TYPE AND SCREEN    EKG None  Radiology CT ABDOMEN PELVIS WO CONTRAST  Result Date: 01/24/2020 CLINICAL DATA:  Incarcerated hernia.  Abdominal pain. EXAM: CT ABDOMEN AND PELVIS WITHOUT CONTRAST TECHNIQUE: Multidetector CT imaging of the abdomen and pelvis was performed following the standard protocol without IV contrast. COMPARISON:  None. FINDINGS: Lower chest: There is significant left lower lobe bronchial wall thickening and mucus plugging with a focal area of consolidation versus rounded atelectasis. There are adjacent calcified pleural  based plaques with significant left lower lobe volume loss.The heart size is normal. Hepatobiliary: The liver is normal. Normal gallbladder.There is no biliary ductal dilation. Pancreas: Normal contours without ductal dilatation. No peripancreatic fluid collection. Spleen: Unremarkable. Adrenals/Urinary Tract: --Adrenal glands: Unremarkable. --Right kidney/ureter: No hydronephrosis or radiopaque kidney stones. --Left kidney/ureter: No hydronephrosis or radiopaque kidney stones. --Urinary bladder: Bladder is decompressed by Foley catheter. Stomach/Bowel: --Stomach/Duodenum: No hiatal hernia or other gastric abnormality. Normal duodenal course and caliber. --Small bowel: There a small bowel containing right inguinal hernia causing some degree of obstruction. The herniated bowel loops demonstrate wall thickening and adjacent fat stranding with free fluid. There is no free air. --Colon: Rectosigmoid diverticulosis without acute inflammation. There is a large amount of stool in the colon. --Appendix: Normal. Vascular/Lymphatic: Atherosclerotic calcification is present within the non-aneurysmal abdominal aorta, without hemodynamically significant stenosis. --No retroperitoneal lymphadenopathy. --No mesenteric lymphadenopathy. --No pelvic or inguinal lymphadenopathy. Reproductive: Unremarkable Other: No ascites or free air. There is a large left inguinal hernia. Musculoskeletal. There is a bilateral pars defect at L5 resulting in grade 1 anterolisthesis of L5 on S1. There is severe disc height loss at the L5-S1 level. IMPRESSION: 1. Small bowel containing right inguinal hernia causing some degree of obstruction. The herniated bowel loops demonstrate wall thickening and adjacent fat stranding with free fluid. No free air. Findings can represent incarceration/strangulation in the appropriate clinical setting. 2. Rectosigmoid diverticulosis without acute inflammation. 3. Large amount of stool in the colon. 4. Left lower lobe  bronchial wall thickening and mucus plugging with a focal area of consolidation versus rounded atelectasis. This is likely chronic given an airspace opacity in this location on the patient's prior chest x-ray from 2019. However, a follow-up three-month contrast enhanced CT of the chest would be useful to confirm stability of this  finding given there is no prior cross-sectional imaging available for comparison. 5. 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 Aortic Atherosclerosis (ICD10-I70.0). Electronically Signed   By: Katherine Mantle M.D.   On: 01/24/2020 20:09    Procedures .Critical Care Performed by: Farrel Gordon, PA-C Authorized by: Farrel Gordon, PA-C   Critical care provider statement:    Critical care time (minutes):  45   Critical care was time spent personally by me on the following activities:  Discussions with consultants, evaluation of patient's response to treatment, examination of patient, ordering and performing treatments and interventions, ordering and review of laboratory studies, ordering and review of radiographic studies, pulse oximetry, re-evaluation of patient's condition, obtaining history from patient or surrogate and review of old charts   (including critical care time)  Medications Ordered in ED Medications  ceFAZolin (ANCEF) IVPB 2g/100 mL premix (has no administration in time range)  HYDROmorphone (DILAUDID) injection 1 mg (1 mg Intravenous Given 01/24/20 1859)    ED Course  I have reviewed the triage vital signs and the nursing notes.  Pertinent labs & imaging results that were available during my care of the patient were reviewed by me and considered in my medical decision making (see chart for details).    MDM Rules/Calculators/A&P                          Patrick Lam is a 74 y.o. male with past medical history of hypertension, narcolepsy, arthritis that presents the emergency department today for abdominal pain.   Patient with abrupt inguinal mass, I do believe that patient has incarcerated hernia at this time, nonreducible and patient is in severe acute distress, writhing in pain.  Will consult general surgery at this time to make them aware.  Awaiting labs and CT scan.  Do feel like patient will need surgery tonight. Not on blood thinners, last time he had something to eat or drink was this morning.  Patient is n.p.o.   Dr. Magnus Ivan, general surgery aware patient.  Awaiting labs and CT scan.  CT scan with small bowel containing right inguinal hernia causing some degree of obstruction, demonstrate that there is fat stranding and free fluid, no free air.  Dr. Magnus Ivan, aware he will take patient to the OR.  I discussed this case with my attending physician who cosigned this note including patient's presenting symptoms, physical exam, and planned diagnostics and interventions. Attending physician stated agreement with plan or made changes to plan which were implemented.   Attending physician assessed patient at bedside.  Final Clinical Impression(s) / ED Diagnoses Final diagnoses:  Incarcerated inguinal hernia    Rx / DC Orders ED Discharge Orders    None       Farrel Gordon, PA-C 01/24/20 2040    Milagros Loll, MD 01/24/20 2357

## 2020-01-24 NOTE — H&P (Signed)
CC: abdominal pain and right groin bulge    HPI: This is a pleasant 74 year old gentleman who presents with a painful bulge in his right groin. He just noticed it today. He has no previous history of an inguinal hernia. His pain started around 11:00 this morning and was in the central abdomen and then moved to the groin. Per his daughter's report, he does cough a lot. He currently has had no nausea or vomiting. He describes a sharp 10 out of 10 pain. He denies fevers or chills. He has no cardiac history. He has had difficulty voiding because of his pain and now has a Foley catheter in place.  Past Medical History:  Diagnosis Date  . Arthritis   . Hypertension   . Narcolepsy and cataplexy   . Pre-diabetes     Past Surgical History:  Procedure Laterality Date  . MOUTH SURGERY    . 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  Social:  reports that he has been smoking cigars. He has been smoking about 0.00 packs per day for the past 15.00 years. He has never used smokeless tobacco. He reports that he does not drink alcohol and does not use drugs.  Allergies: No Known Allergies  Medications: I have reviewed the patient's current medications.  Results for orders placed or performed during the hospital encounter of 01/24/20 (from the past 48 hour(s))  Urinalysis, Routine w reflex microscopic Urine, Catheterized     Status: Abnormal   Collection Time: 01/24/20  7:12 PM  Result Value Ref Range   Color, Urine YELLOW YELLOW   APPearance CLOUDY (A) CLEAR   Specific Gravity, Urine 1.013 1.005 - 1.030   pH 8.0 5.0 - 8.0   Glucose, UA NEGATIVE NEGATIVE mg/dL   Hgb urine dipstick NEGATIVE NEGATIVE   Bilirubin Urine NEGATIVE NEGATIVE   Ketones, ur 20 (A) NEGATIVE mg/dL   Protein, ur NEGATIVE NEGATIVE mg/dL   Nitrite NEGATIVE  NEGATIVE   Leukocytes,Ua NEGATIVE NEGATIVE    Comment: Performed at [facility], [address] [place], [place] [address]  Comprehensive metabolic panel     Status: Abnormal   Collection Time: 01/24/20  7:15 PM  Result Value Ref Range   Sodium 132 (L) 135 - 145 mmol/L   Potassium 4.0 3.5 - 5.1 mmol/L   Chloride 95 (L) 98 - 111 mmol/L   CO2 25 22 - 32 mmol/L   Glucose, Bld 134 (H) 70 - 99 mg/dL    Comment: Glucose reference range applies only to samples taken after fasting for at least 8 hours.   BUN 16 8 - 23 mg/dL   Creatinine, Ser 4.03 0.61 - 1.24 mg/dL   Calcium 9.0 8.9 - 47.4 mg/dL   Total Protein 7.7 6.5 - 8.1 g/dL   Albumin 4.2 3.5 - 5.0 g/dL   AST 24 15 - 41 U/L   ALT 19 0 - 44 U/L   Alkaline Phosphatase 49 38 - 126 U/L   Total Bilirubin 0.7 0.3 - 1.2 mg/dL   GFR, Estimated >25 >95 mL/min    Comment: (NOTE) Calculated using the CKD-EPI Creatinine Equation (2021)    Anion gap 12 5 - 15    Comment: Performed at [facility], [address], [place], [place] [address]  CBC with Differential  Status: Abnormal   Collection Time: 01/24/20  7:15 PM  Result Value Ref Range   WBC 14.7 (H) 4.0 - 10.5 K/uL   RBC 6.50 (H) 4.22 - 5.81 MIL/uL   Hemoglobin 16.2 13.0 - 17.0 g/dL   HCT 84.6 39 - 52 %   MCV 75.4 (L) 80.0 - 100.0 fL   MCH 24.9 (L) 26.0 - 34.0 pg   MCHC 33.1 30.0 - 36.0 g/dL   RDW 96.2 (H) 95.2 - 84.1 %   Platelets 293 150 - 400 K/uL   nRBC 0.0 0.0 - 0.2 %   Neutrophils Relative % 86 %   Neutro Abs 12.7 (H) 1.7 - 7.7 K/uL   Lymphocytes Relative 7 %   Lymphs Abs 1.0 0.7 - 4.0 K/uL   Monocytes Relative 6 %   Monocytes Absolute 0.8 0.1 - 1.0 K/uL   Eosinophils Relative 0 %   Eosinophils Absolute 0.0 0.0 - 0.5 K/uL   Basophils Relative 0 %   Basophils Absolute 0.1 0.0 - 0.1 K/uL   Immature Granulocytes 1 %   Abs Immature Granulocytes 0.09 (H) 0.00 - 0.07 K/uL    Comment: Performed at [facility], [address] [place], [place] [address]  Protime-INR     Status: None   Collection Time: 01/24/20  7:15 PM  Result Value Ref Range   Prothrombin Time 13.4 11.4 - 15.2 seconds   INR 1.1 0.8 - 1.2    Comment: (NOTE) INR goal varies based on device and disease states. Performed at [facility] [address] [place], [place] [address]   Lactic acid, plasma     Status: None   Collection Time: 01/24/20  7:30 PM  Result Value Ref Range   Lactic Acid, Venous 1.2 0.5 - 1.9 mmol/L    Comment: Performed at [facility], [address], [place], [place] [address]  I-stat chem 8, ED (not at [facility] or [facility])     Status: Abnormal   Collection Time: 01/24/20  7:35 PM  Result Value Ref Range   Sodium 134 (L) 135 - 145 mmol/L   Potassium 4.0 3.5 - 5.1 mmol/L   Chloride 95 (L) 98 - 111 mmol/L   BUN 15 8 - 23 mg/dL   Creatinine, Ser 4.03 0.61 - 1.24 mg/dL   Glucose, Bld 474 (H) 70 - 99 mg/dL    Comment: Glucose reference range applies only to samples taken after fasting for at least 8 hours.   Calcium, Ion 1.10 (L) 1.15 - 1.40 mmol/L   TCO2 27 22 - 32 mmol/L   Hemoglobin 18.0 (H) 13.0 - 17.0 g/dL   HCT 25.9 (H) 39 - 52 %    CT ABDOMEN PELVIS WO CONTRAST  Result Date: 01/24/2020 CLINICAL DATA:  Incarcerated hernia.  Abdominal pain. EXAM: CT ABDOMEN AND PELVIS WITHOUT CONTRAST TECHNIQUE: Multidetector CT imaging of the abdomen and pelvis was performed following the standard protocol without IV contrast. COMPARISON:  None. FINDINGS: Lower chest: There is significant left lower lobe bronchial wall thickening and mucus plugging with a focal area of consolidation versus rounded atelectasis. There are adjacent calcified pleural based plaques with significant left lower lobe volume loss.The heart size is normal. Hepatobiliary: The liver is normal. Normal gallbladder.There is no biliary ductal dilation. Pancreas: Normal contours without ductal dilatation. No peripancreatic  fluid collection. Spleen: Unremarkable. Adrenals/Urinary Tract: --Adrenal glands: Unremarkable. --Right kidney/ureter: No hydronephrosis or radiopaque kidney stones. --Left kidney/ureter: No hydronephrosis or radiopaque kidney stones. --  Urinary bladder: Bladder is decompressed by Foley catheter. Stomach/Bowel: --Stomach/Duodenum: No hiatal hernia or other gastric abnormality. Normal duodenal course and caliber. --Small bowel: There a small bowel containing right inguinal hernia causing some degree of obstruction. The herniated bowel loops demonstrate wall thickening and adjacent fat stranding with free fluid. There is no free air. --Colon: Rectosigmoid diverticulosis without acute inflammation. There is a large amount of stool in the colon. --Appendix: Normal. Vascular/Lymphatic: Atherosclerotic calcification is present within the non-aneurysmal abdominal aorta, without hemodynamically significant stenosis. --No retroperitoneal lymphadenopathy. --No mesenteric lymphadenopathy. --No pelvic or inguinal lymphadenopathy. Reproductive: Unremarkable Other: No ascites or free air. There is a large left inguinal hernia. Musculoskeletal. There is a bilateral pars defect at L5 resulting in grade 1 anterolisthesis of L5 on S1. There is severe disc height loss at the L5-S1 level. IMPRESSION: 1. Small bowel containing right inguinal hernia causing some degree of obstruction. The herniated bowel loops demonstrate wall thickening and adjacent fat stranding with free fluid. No free air. Findings can represent incarceration/strangulation in the appropriate clinical setting. 2. Rectosigmoid diverticulosis without acute inflammation. 3. Large amount of stool in the colon. 4. Left lower lobe bronchial wall thickening and mucus plugging with a focal area of consolidation versus rounded atelectasis. This is likely chronic given an airspace opacity in this location on the patient's prior chest x-ray from 2019. However, a follow-up  three-month contrast enhanced CT of the chest would be useful to confirm stability of this finding given there is no prior cross-sectional imaging available for comparison. 5. 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 Aortic Atherosclerosis (ICD10-I70.0). Electronically Signed   By: Katherine Mantle M.D.   On: 01/24/2020 20:09    ROS - all of the below systems have been reviewed with the patient and positives are indicated with bold text General: chills, fever or night sweats Eyes: blurry vision or double vision ENT: epistaxis or sore throat Allergy/Immunology: itchy/watery eyes or nasal congestion Hematologic/Lymphatic: bleeding problems, blood clots or swollen lymph nodes Endocrine: temperature intolerance or unexpected weight changes Breast: new or changing breast lumps or nipple discharge Resp: cough, shortness of breath, or wheezing CV: chest pain or dyspnea on exertion GI: as per HPI GU: dysuria, trouble voiding, or hematuria MSK: joint pain or joint stiffness Neuro: TIA or stroke symptoms Derm: pruritus and skin lesion changes Psych: anxiety and depression  PE There were no vitals taken for this visit. Constitutional: NAD; conversant; no deformities Eyes: Moist conjunctiva; no lid lag; anicteric; PERRL Neck: Trachea midline; no thyromegaly Lungs: Normal respiratory effort; no tactile fremitus CV: RRR; no palpable thrills; no pitting edema GI: Abd incarcerated, tender, large right inguinal hernia without evidence of left inguinal hernia or umbilical hernia.; no palpable hepatosplenomegaly MSK: Normal range of motion of extremities; no clubbing/cyanosis Psychiatric: Appropriate affect; alert and oriented x3 Lymphatic: No palpable cervical or axillary lymphadenopathy  Results for orders placed or performed during the hospital encounter of 01/24/20 (from the past 48 hour(s))  Urinalysis, Routine w reflex microscopic Urine, Catheterized      Status: Abnormal   Collection Time: 01/24/20  7:12 PM  Result Value Ref Range   Color, Urine YELLOW YELLOW   APPearance CLOUDY (A) CLEAR   Specific Gravity, Urine 1.013 1.005 - 1.030   pH 8.0 5.0 - 8.0   Glucose, UA NEGATIVE NEGATIVE mg/dL   Hgb urine dipstick NEGATIVE NEGATIVE   Bilirubin Urine NEGATIVE NEGATIVE   Ketones, ur 20 (A) NEGATIVE mg/dL  Protein, ur NEGATIVE NEGATIVE mg/dL   Nitrite NEGATIVE NEGATIVE   Leukocytes,Ua NEGATIVE NEGATIVE    Comment: Performed at [facility], [address], [place], [place] [address]  Comprehensive metabolic panel     Status: Abnormal   Collection Time: 01/24/20  7:15 PM  Result Value Ref Range   Sodium 132 (L) 135 - 145 mmol/L   Potassium 4.0 3.5 - 5.1 mmol/L   Chloride 95 (L) 98 - 111 mmol/L   CO2 25 22 - 32 mmol/L   Glucose, Bld 134 (H) 70 - 99 mg/dL    Comment: Glucose reference range applies only to samples taken after fasting for at least 8 hours.   BUN 16 8 - 23 mg/dL   Creatinine, Ser 0.62 0.61 - 1.24 mg/dL   Calcium 9.0 8.9 - 69.4 mg/dL   Total Protein 7.7 6.5 - 8.1 g/dL   Albumin 4.2 3.5 - 5.0 g/dL   AST 24 15 - 41 U/L   ALT 19 0 - 44 U/L   Alkaline Phosphatase 49 38 - 126 U/L   Total Bilirubin 0.7 0.3 - 1.2 mg/dL   GFR, Estimated >85 >46 mL/min    Comment: (NOTE) Calculated using the CKD-EPI Creatinine Equation (2021)    Anion gap 12 5 - 15    Comment: Performed at [facility], [address], [place], [place] [address]  CBC with Differential     Status: Abnormal   Collection Time: 01/24/20  7:15 PM  Result Value Ref Range   WBC 14.7 (H) 4.0 - 10.5 K/uL   RBC 6.50 (H) 4.22 - 5.81 MIL/uL   Hemoglobin 16.2 13.0 - 17.0 g/dL   HCT 00.9 39 - 52 %   MCV 75.4 (L) 80.0 - 100.0 fL   MCH 24.9 (L) 26.0 - 34.0 pg   MCHC 33.1 30.0 - 36.0 g/dL   RDW 38.1 (H) 82.9 - 93.7 %   Platelets 293 150 - 400 K/uL   nRBC 0.0 0.0 - 0.2 %   Neutrophils Relative % 86 %   Neutro Abs 12.7 (H) 1.7 - 7.7  K/uL   Lymphocytes Relative 7 %   Lymphs Abs 1.0 0.7 - 4.0 K/uL   Monocytes Relative 6 %   Monocytes Absolute 0.8 0.1 - 1.0 K/uL   Eosinophils Relative 0 %   Eosinophils Absolute 0.0 0.0 - 0.5 K/uL   Basophils Relative 0 %   Basophils Absolute 0.1 0.0 - 0.1 K/uL   Immature Granulocytes 1 %   Abs Immature Granulocytes 0.09 (H) 0.00 - 0.07 K/uL    Comment: Performed at [facility], [address] [place], [place] [address]  Protime-INR     Status: None   Collection Time: 01/24/20  7:15 PM  Result Value Ref Range   Prothrombin Time 13.4 11.4 - 15.2 seconds   INR 1.1 0.8 - 1.2    Comment: (NOTE) INR goal varies based on device and disease states. Performed at [facility], [address] [place], [place] [address]   Lactic acid, plasma     Status: None   Collection Time: 01/24/20  7:30 PM  Result Value Ref Range   Lactic Acid, Venous 1.2 0.5 - 1.9 mmol/L    Comment: Performed at [facility], [address], [place], [place] [address]  I-stat chem 8, ED (not at [facility] or [facility])     Status: Abnormal   Collection Time: 01/24/20  7:35 PM  Result  Value Ref Range   Sodium 134 (L) 135 - 145 mmol/L   Potassium 4.0 3.5 - 5.1 mmol/L   Chloride 95 (L) 98 - 111 mmol/L   BUN 15 8 - 23 mg/dL   Creatinine, Ser 8.290.80 0.61 - 1.24 mg/dL   Glucose, Bld 562133 (H) 70 - 99 mg/dL    Comment: Glucose reference range applies only to samples taken after fasting for at least 8 hours.   Calcium, Ion 1.10 (L) 1.15 - 1.40 mmol/L   TCO2 27 22 - 32 mmol/L   Hemoglobin 18.0 (H) 13.0 - 17.0 g/dL   HCT 13.053.0 (H) 39 - 52 %    CT ABDOMEN PELVIS WO CONTRAST  Result Date: 01/24/2020 CLINICAL DATA:  Incarcerated hernia.  Abdominal pain. EXAM: CT ABDOMEN AND PELVIS WITHOUT CONTRAST TECHNIQUE: Multidetector CT imaging of the abdomen and pelvis was performed following the standard protocol without IV contrast. COMPARISON:  None. FINDINGS: Lower chest: There is  significant left lower lobe bronchial wall thickening and mucus plugging with a focal area of consolidation versus rounded atelectasis. There are adjacent calcified pleural based plaques with significant left lower lobe volume loss.The heart size is normal. Hepatobiliary: The liver is normal. Normal gallbladder.There is no biliary ductal dilation. Pancreas: Normal contours without ductal dilatation. No peripancreatic fluid collection. Spleen: Unremarkable. Adrenals/Urinary Tract: --Adrenal glands: Unremarkable. --Right kidney/ureter: No hydronephrosis or radiopaque kidney stones. --Left kidney/ureter: No hydronephrosis or radiopaque kidney stones. --Urinary bladder: Bladder is decompressed by Foley catheter. Stomach/Bowel: --Stomach/Duodenum: No hiatal hernia or other gastric abnormality. Normal duodenal course and caliber. --Small bowel: There a small bowel containing right inguinal hernia causing some degree of obstruction. The herniated bowel loops demonstrate wall thickening and adjacent fat stranding with free fluid. There is no free air. --Colon: Rectosigmoid diverticulosis without acute inflammation. There is a large amount of stool in the colon. --Appendix: Normal. Vascular/Lymphatic: Atherosclerotic calcification is present within the non-aneurysmal abdominal aorta, without hemodynamically significant stenosis. --No retroperitoneal lymphadenopathy. --No mesenteric lymphadenopathy. --No pelvic or inguinal lymphadenopathy. Reproductive: Unremarkable Other: No ascites or free air. There is a large left inguinal hernia. Musculoskeletal. There is a bilateral pars defect at L5 resulting in grade 1 anterolisthesis of L5 on S1. There is severe disc height loss at the L5-S1 level. IMPRESSION: 1. Small bowel containing right inguinal hernia causing some degree of obstruction. The herniated bowel loops demonstrate wall thickening and adjacent fat stranding with free fluid. No free air. Findings can represent  incarceration/strangulation in the appropriate clinical setting. 2. Rectosigmoid diverticulosis without acute inflammation. 3. Large amount of stool in the colon. 4. Left lower lobe bronchial wall thickening and mucus plugging with a focal area of consolidation versus rounded atelectasis. This is likely chronic given an airspace opacity in this location on the patient's prior chest x-ray from 2019. However, a follow-up three-month contrast enhanced CT of the chest would be useful to confirm stability of this finding given there is no prior cross-sectional imaging available for comparison. 5. 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 Aortic Atherosclerosis (ICD10-I70.0). Electronically Signed   By: Katherine Mantlehristopher  Green M.D.   On: 01/24/2020 20:09     A/P: Incarcerated right inguinal hernia  I explained the diagnosis to the patient and his daughter. This is quite a large hernia with I suspect a very small defect and is not reducible so emergent repair in the operating room is recommended to prevent bowel strangulation and ischemia. I explained the reasons for this  with him in detail. I discussed proceeding with a right inguinal hernia repair with possible mesh. I discussed the risk which includes but is not limited to bleeding, infection, injury to surrounding structures, the need for bowel resection, use of mesh, hernia recurrence, cardiopulmonary issues, etc. He understands and agrees to proceed with surgery which is scheduled emergently.  Abigail Miyamoto, M.D. [facility] Use [email or web address] to contact on call provider

## 2020-01-24 NOTE — Anesthesia Procedure Notes (Signed)
Procedure Name: Intubation Date/Time: 01/24/2020 9:23 PM Performed by: Montel Clock, CRNA Pre-anesthesia Checklist: Patient identified, Emergency Drugs available, Suction available, Patient being monitored and Timeout performed Patient Re-evaluated:Patient Re-evaluated prior to induction Oxygen Delivery Method: Circle system utilized Preoxygenation: Pre-oxygenation with 100% oxygen Induction Type: IV induction, Rapid sequence and Cricoid Pressure applied Laryngoscope Size: Mac and 3 Grade View: Grade I Tube type: Oral Tube size: 7.5 mm Number of attempts: 1 Airway Equipment and Method: Stylet Placement Confirmation: ETT inserted through vocal cords under direct vision,  positive ETCO2 and breath sounds checked- equal and bilateral Secured at: 23 cm Tube secured with: Tape Dental Injury: Teeth and Oropharynx as per pre-operative assessment

## 2020-01-24 NOTE — ED Triage Notes (Signed)
Per [facility], from home. 11 am started having abdominal pain around belly button and started  to radiate to right side of groin and testicles. Unable to urinate now. 180/80 R 35 HR 70 100 O2 98.7 22 LF AC 250 ml n/s 200 mcg fentyl with no result

## 2020-01-25 ENCOUNTER — Encounter (HOSPITAL_COMMUNITY): Payer: Self-pay | Admitting: Surgery

## 2020-01-25 ENCOUNTER — Other Ambulatory Visit: Payer: Self-pay

## 2020-01-25 LAB — BASIC METABOLIC PANEL
Anion gap: 11 (ref 5–15)
BUN: 13 mg/dL (ref 8–23)
CO2: 24 mmol/L (ref 22–32)
Calcium: 8.4 mg/dL — ABNORMAL LOW (ref 8.9–10.3)
Chloride: 96 mmol/L — ABNORMAL LOW (ref 98–111)
Creatinine, Ser: 0.81 mg/dL (ref 0.61–1.24)
GFR, Estimated: >60 mL/min (ref >60)
Glucose, Bld: 166 mg/dL — ABNORMAL HIGH (ref 70–99)
Potassium: 4.4 mmol/L (ref 3.5–5.1)
Sodium: 131 mmol/L — ABNORMAL LOW (ref 135–145)

## 2020-01-25 LAB — CBC
HCT: 47.7 % (ref 39.0–52.0)
Hemoglobin: 15.7 g/dL (ref 13.0–17.0)
MCH: 24.9 pg — ABNORMAL LOW (ref 26.0–34.0)
MCHC: 32.9 g/dL (ref 30.0–36.0)
MCV: 75.7 fL — ABNORMAL LOW (ref 80.0–100.0)
Platelets: 281 10*3/uL (ref 150–400)
RBC: 6.3 MIL/uL — ABNORMAL HIGH (ref 4.22–5.81)
RDW: 16.5 % — ABNORMAL HIGH (ref 11.5–15.5)
WBC: 20.5 10*3/uL — ABNORMAL HIGH (ref 4.0–10.5)
nRBC: 0 % (ref 0.0–0.2)

## 2020-01-25 LAB — GLUCOSE, CAPILLARY: Glucose-Capillary: 151 mg/dL — ABNORMAL HIGH (ref 70–99)

## 2020-01-25 MED ORDER — POTASSIUM CHLORIDE IN NACL 20-0.9 MEQ/L-% IV SOLN
INTRAVENOUS | Status: DC
  Filled 2020-01-25 (×5): qty 1000

## 2020-01-25 MED ORDER — ONDANSETRON HCL 4 MG/2ML IJ SOLN
4.0000 mg | Freq: Four times a day (QID) | INTRAMUSCULAR | Status: DC | PRN
  Administered 2020-01-29: 12:00:00 4 mg via INTRAVENOUS
  Filled 2020-01-25 (×2): qty 2

## 2020-01-25 MED ORDER — DIPHENHYDRAMINE HCL 12.5 MG/5ML PO ELIX: 12.5000 mg | ORAL_SOLUTION | Freq: Four times a day (QID) | ORAL | Status: DC | PRN

## 2020-01-25 MED ORDER — HYDROCHLOROTHIAZIDE 25 MG PO TABS
25.0000 mg | ORAL_TABLET | Freq: Every day | ORAL | Status: DC
  Administered 2020-01-25 – 2020-01-29 (×5): 25 mg via ORAL
  Filled 2020-01-25 (×5): qty 1

## 2020-01-25 MED ORDER — DIPHENHYDRAMINE HCL 50 MG/ML IJ SOLN: 12.5000 mg | Freq: Four times a day (QID) | INTRAMUSCULAR | Status: DC | PRN

## 2020-01-25 MED ORDER — DOCUSATE SODIUM 100 MG PO CAPS
100.0000 mg | ORAL_CAPSULE | Freq: Two times a day (BID) | ORAL | Status: DC
  Administered 2020-01-25 – 2020-01-28 (×8): 100 mg via ORAL
  Filled 2020-01-25 (×8): qty 1

## 2020-01-25 MED ORDER — LOSARTAN POTASSIUM 25 MG PO TABS
25.0000 mg | ORAL_TABLET | Freq: Every day | ORAL | Status: DC
  Administered 2020-01-25 – 2020-01-29 (×5): 25 mg via ORAL
  Filled 2020-01-25 (×5): qty 1

## 2020-01-25 MED ORDER — CLOMIPRAMINE HCL 25 MG PO CAPS
25.0000 mg | ORAL_CAPSULE | Freq: Three times a day (TID) | ORAL | Status: DC
  Administered 2020-01-25 – 2020-02-15 (×56): 25 mg via ORAL
  Filled 2020-01-25 (×69): qty 1

## 2020-01-25 MED ORDER — ACETAMINOPHEN 500 MG PO TABS
1000.0000 mg | ORAL_TABLET | Freq: Four times a day (QID) | ORAL | Status: DC
  Administered 2020-01-25 – 2020-01-29 (×17): 1000 mg via ORAL
  Filled 2020-01-25 (×17): qty 2

## 2020-01-25 MED ORDER — ORAL CARE MOUTH RINSE
15.0000 mL | Freq: Two times a day (BID) | OROMUCOSAL | Status: DC
  Administered 2020-01-25 – 2020-02-15 (×40): 15 mL via OROMUCOSAL

## 2020-01-25 MED ORDER — TRAMADOL HCL 50 MG PO TABS
50.0000 mg | ORAL_TABLET | Freq: Four times a day (QID) | ORAL | Status: DC | PRN
  Administered 2020-01-25 – 2020-01-26 (×3): 50 mg via ORAL
  Filled 2020-01-25 (×3): qty 1

## 2020-01-25 MED ORDER — ONDANSETRON 4 MG PO TBDP: 4.0000 mg | ORAL_TABLET | Freq: Four times a day (QID) | ORAL | Status: DC | PRN

## 2020-01-25 MED ORDER — CEFAZOLIN SODIUM-DEXTROSE 2-4 GM/100ML-% IV SOLN
2.0000 g | Freq: Three times a day (TID) | INTRAVENOUS | Status: AC
  Administered 2020-01-25: 2 g via INTRAVENOUS
  Filled 2020-01-25: qty 100

## 2020-01-25 MED ORDER — HYDROMORPHONE HCL 1 MG/ML IJ SOLN
1.0000 mg | INTRAMUSCULAR | Status: DC | PRN
  Administered 2020-01-27 – 2020-01-28 (×3): 1 mg via INTRAVENOUS
  Filled 2020-01-25 (×4): qty 1

## 2020-01-25 MED ORDER — ENOXAPARIN SODIUM 40 MG/0.4ML ~~LOC~~ SOLN
40.0000 mg | SUBCUTANEOUS | Status: DC
  Administered 2020-01-25 – 2020-02-15 (×20): 40 mg via SUBCUTANEOUS
  Filled 2020-01-25 (×22): qty 0.4

## 2020-01-25 MED ORDER — DILTIAZEM HCL ER COATED BEADS 240 MG PO CP24
240.0000 mg | ORAL_CAPSULE | Freq: Every day | ORAL | Status: DC
  Administered 2020-01-25 – 2020-01-29 (×5): 240 mg via ORAL
  Filled 2020-01-25 (×5): qty 1

## 2020-01-25 MED ORDER — OXYCODONE HCL 5 MG PO TABS
5.0000 mg | ORAL_TABLET | ORAL | Status: DC | PRN
  Administered 2020-01-25: 5 mg via ORAL
  Administered 2020-01-26: 10 mg via ORAL
  Administered 2020-01-26 – 2020-01-28 (×5): 5 mg via ORAL
  Filled 2020-01-25 (×5): qty 1
  Filled 2020-01-25: qty 2
  Filled 2020-01-25: qty 1

## 2020-01-25 NOTE — Plan of Care (Signed)
  Problem: Education: Goal: Knowledge of General Education information will improve Description: Including pain rating scale, medication(s)/side effects and non-pharmacologic comfort measures Outcome: Progressing   Problem: Health Behavior/Discharge Planning: Goal: Ability to manage health-related needs will improve Outcome: Progressing   Problem: Clinical Measurements: Goal: Ability to maintain clinical measurements within normal limits will improve Outcome: Progressing Goal: Will remain free from infection Outcome: Progressing Goal: Diagnostic test results will improve Outcome: Progressing Goal: Respiratory complications will improve Outcome: Progressing Goal: Cardiovascular complication will be avoided Outcome: Progressing   Problem: Activity: Goal: Risk for activity intolerance will decrease Outcome: Progressing   Problem: Nutrition: Goal: Adequate nutrition will be maintained Outcome: Progressing   Problem: Coping: Goal: Level of anxiety will decrease Outcome: Progressing   Problem: Elimination: Goal: Will not experience complications related to urinary retention Outcome: Progressing   Problem: Pain Managment: Goal: General experience of comfort will improve Outcome: Progressing   Problem: Safety: Goal: Ability to remain free from injury will improve Outcome: Progressing   Problem: Skin Integrity: Goal: Risk for impaired skin integrity will decrease Outcome: Progressing   Problem: Clinical Measurements: Goal: Postoperative complications will be avoided or minimized Outcome: Progressing   Problem: Skin Integrity: Goal: Demonstration of wound healing without infection will improve Outcome: Progressing

## 2020-01-25 NOTE — Progress Notes (Signed)
Progress Note  1 Day Post-Op  Subjective: Patient doing well overall this AM. Some pain in RLQ but not severe. Denies nausea or vomiting. Foley still present, discussed removal with RN for this AM.  Patient reports he lives alone and is very independent. His daughter is close by but not able to stay with him 24h per day.   Objective: Vital signs in last 24 hours: Temp:  [97.9 F (36.6 C)-99.1 F (37.3 C)] 97.9 F (36.6 C) (12/08 0834) Pulse Rate:  [63-99] 99 (12/08 0834) Resp:  [19-21] 20 (12/08 0834) BP: (125-156)/(60-70) 125/61 (12/08 0834) SpO2:  [85 %-100 %] 92 % (12/08 0851) Weight:  [81.6 kg] 81.6 kg (12/08 0000) Last BM Date: 01/24/20  Intake/Output from previous day: 12/07 0701 - 12/08 0700 In: 1567.7 [I.V.:1467.7; IV Piggyback:100] Out: 2100 [Urine:2000; Blood:100] Intake/Output this shift: Total I/O In: -  Out: 350 [Urine:350]  PE: General: pleasant, WD, WN male who is laying in bed in NAD Heart: regular, rate, and rhythm.  Normal s1,s2. No obvious murmurs, gallops, or rubs noted.  Palpable radial and pedal pulses bilaterally Lungs: CTAB, no wheezes, rhonchi, or rales noted.  Respiratory effort nonlabored Abd: soft, mildly ttp in RLQ without peritonitis, ND, +BS, R groin incision c/d/i, no masses, hernias, or organomegaly MS: all 4 extremities are symmetrical with no cyanosis, clubbing, or edema. Skin: warm and dry with no masses, lesions, or rashes Neuro: Cranial nerves 2-12 grossly intact, sensation is normal throughout Psych: A&Ox3 with an appropriate affect.    Lab Results:  Recent Labs    01/24/20 1915 01/24/20 1915 01/24/20 1935 01/25/20 0720  WBC 14.7*  --   --  20.5*  HGB 16.2   < > 18.0* 15.7  HCT 49.0   < > 53.0* 47.7  PLT 293  --   --  281   < > = values in this interval not displayed.   BMET Recent Labs    01/24/20 1915 01/24/20 1915 01/24/20 1935 01/25/20 0720  NA 132*   < > 134* 131*  K 4.0   < > 4.0 4.4  CL 95*   < > 95* 96*   CO2 25  --   --  24  GLUCOSE 134*   < > 133* 166*  BUN 16   < > 15 13  CREATININE 0.85   < > 0.80 0.81  CALCIUM 9.0  --   --  8.4*   < > = values in this interval not displayed.   PT/INR Recent Labs    01/24/20 1915  LABPROT 13.4  INR 1.1   CMP     Component Value Date/Time   NA 131 (L) 01/25/2020 0720   K 4.4 01/25/2020 0720   CL 96 (L) 01/25/2020 0720   CO2 24 01/25/2020 0720   GLUCOSE 166 (H) 01/25/2020 0720   BUN 13 01/25/2020 0720   CREATININE 0.81 01/25/2020 0720   CALCIUM 8.4 (L) 01/25/2020 0720   PROT 7.7 01/24/2020 1915   ALBUMIN 4.2 01/24/2020 1915   AST 24 01/24/2020 1915   ALT 19 01/24/2020 1915   ALKPHOS 49 01/24/2020 1915   BILITOT 0.7 01/24/2020 1915   GFRNONAA >60 01/25/2020 0720   GFRAA >60 02/04/2018 0514   Lipase  No results found for: LIPASE     Studies/Results: CT ABDOMEN PELVIS WO CONTRAST  Result Date: 01/24/2020 CLINICAL DATA:  Incarcerated hernia.  Abdominal pain. EXAM: CT ABDOMEN AND PELVIS WITHOUT CONTRAST TECHNIQUE: Multidetector CT imaging of the abdomen  and pelvis was performed following the standard protocol without IV contrast. COMPARISON:  None. FINDINGS: Lower chest: There is significant left lower lobe bronchial wall thickening and mucus plugging with a focal area of consolidation versus rounded atelectasis. There are adjacent calcified pleural based plaques with significant left lower lobe volume loss.The heart size is normal. Hepatobiliary: The liver is normal. Normal gallbladder.There is no biliary ductal dilation. Pancreas: Normal contours without ductal dilatation. No peripancreatic fluid collection. Spleen: Unremarkable. Adrenals/Urinary Tract: --Adrenal glands: Unremarkable. --Right kidney/ureter: No hydronephrosis or radiopaque kidney stones. --Left kidney/ureter: No hydronephrosis or radiopaque kidney stones. --Urinary bladder: Bladder is decompressed by Foley catheter. Stomach/Bowel: --Stomach/Duodenum: No hiatal hernia or  other gastric abnormality. Normal duodenal course and caliber. --Small bowel: There a small bowel containing right inguinal hernia causing some degree of obstruction. The herniated bowel loops demonstrate wall thickening and adjacent fat stranding with free fluid. There is no free air. --Colon: Rectosigmoid diverticulosis without acute inflammation. There is a large amount of stool in the colon. --Appendix: Normal. Vascular/Lymphatic: Atherosclerotic calcification is present within the non-aneurysmal abdominal aorta, without hemodynamically significant stenosis. --No retroperitoneal lymphadenopathy. --No mesenteric lymphadenopathy. --No pelvic or inguinal lymphadenopathy. Reproductive: Unremarkable Other: No ascites or free air. There is a large left inguinal hernia. Musculoskeletal. There is a bilateral pars defect at L5 resulting in grade 1 anterolisthesis of L5 on S1. There is severe disc height loss at the L5-S1 level. IMPRESSION: 1. Small bowel containing right inguinal hernia causing some degree of obstruction. The herniated bowel loops demonstrate wall thickening and adjacent fat stranding with free fluid. No free air. Findings can represent incarceration/strangulation in the appropriate clinical setting. 2. Rectosigmoid diverticulosis without acute inflammation. 3. Large amount of stool in the colon. 4. Left lower lobe bronchial wall thickening and mucus plugging with a focal area of consolidation versus rounded atelectasis. This is likely chronic given an airspace opacity in this location on the patient's prior chest x-ray from 2019. However, a follow-up three-month contrast enhanced CT of the chest would be useful to confirm stability of this finding given there is no prior cross-sectional imaging available for comparison. 5. 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 Aortic Atherosclerosis (ICD10-I70.0). Electronically Signed   By: Katherine Mantle M.D.   On:  01/24/2020 20:09    Anti-infectives: Anti-infectives (From admission, onward)   Start     Dose/Rate Route Frequency Ordered Stop   01/25/20 0500  ceFAZolin (ANCEF) IVPB 2g/100 mL premix        2 g 200 mL/hr over 30 Minutes Intravenous Every 8 hours 01/25/20 0004 01/25/20 0537   01/24/20 2103  ceFAZolin (ANCEF) 2-4 GM/100ML-% IVPB       Note to Pharmacy: Johnette Abraham   : cabinet override      01/24/20 2103 01/24/20 2134   01/24/20 2015  ceFAZolin (ANCEF) IVPB 2g/100 mL premix        2 g 200 mL/hr over 30 Minutes Intravenous  Once 01/24/20 2014 01/24/20 2134       Assessment/Plan HTN Arthritis Narcolepsy  Incarcerated RIH S/p open RIH repair with mesh 01/24/20 Dr. Magnus Ivan - POD#1 - remove foley this AM - ok to advance patient slowly to soft diet as tolerated - mobilize today  - repeat labs tomorrow AM  FEN: CLD, IVF VTE: lovenox ID: Ancef periop  LOS: 1 day    Juliet Rude , [facility] 01/25/2020, 9:08 AM Please see Amion for pager number during  day hours 7:00am-4:30pm

## 2020-01-25 NOTE — Plan of Care (Signed)
  Problem: Education: Goal: Knowledge of General Education information will improve Description: Including pain rating scale, medication(s)/side effects and non-pharmacologic comfort measures Outcome: Progressing   Problem: Health Behavior/Discharge Planning: Goal: Ability to manage health-related needs will improve Outcome: Progressing   Problem: Clinical Measurements: Goal: Ability to maintain clinical measurements within normal limits will improve Outcome: Progressing Goal: Will remain free from infection Outcome: Progressing Goal: Diagnostic test results will improve Outcome: Progressing Goal: Respiratory complications will improve Outcome: Progressing Goal: Cardiovascular complication will be avoided Outcome: Progressing   Problem: Coping: Goal: Level of anxiety will decrease Outcome: Progressing   Problem: Pain Managment: Goal: General experience of comfort will improve Outcome: Progressing   Problem: Safety: Goal: Ability to remain free from injury will improve Outcome: Progressing   Problem: Skin Integrity: Goal: Risk for impaired skin integrity will decrease Outcome: Progressing   Problem: Clinical Measurements: Goal: Postoperative complications will be avoided or minimized Outcome: Progressing   Problem: Skin Integrity: Goal: Demonstration of wound healing without infection will improve Outcome: Progressing

## 2020-01-25 NOTE — Anesthesia Postprocedure Evaluation (Signed)
Anesthesia Post Note  Patient: Patrick Lam  Procedure(s) Performed: REPAIR INGUINAL INCARCERATED HERNIA WITH MESH (Right )     Patient location during evaluation: PACU Anesthesia Type: General Level of consciousness: awake and alert, awake and oriented Pain management: pain level controlled Vital Signs Assessment: post-procedure vital signs reviewed and stable Respiratory status: spontaneous breathing, nonlabored ventilation, respiratory function stable and patient connected to nasal cannula oxygen Cardiovascular status: blood pressure returned to baseline and stable Postop Assessment: no apparent nausea or vomiting Anesthetic complications: no   No complications documented.  Last Vitals:  Vitals:   01/25/20 1134 01/25/20 1135  BP:  125/71  Pulse:  (!) 102  Resp:  16  Temp: 36.6 C   SpO2:  94%    Last Pain:  Vitals:   01/25/20 2016  TempSrc:   PainSc: 4                  Cecile Hearing

## 2020-01-25 NOTE — Plan of Care (Signed)

## 2020-01-26 LAB — BASIC METABOLIC PANEL
Anion gap: 5 (ref 5–15)
BUN: 14 mg/dL (ref 8–23)
CO2: 29 mmol/L (ref 22–32)
Calcium: 8.4 mg/dL — ABNORMAL LOW (ref 8.9–10.3)
Chloride: 97 mmol/L — ABNORMAL LOW (ref 98–111)
Creatinine, Ser: 0.71 mg/dL (ref 0.61–1.24)
GFR, Estimated: >60 mL/min (ref >60)
Glucose, Bld: 123 mg/dL — ABNORMAL HIGH (ref 70–99)
Potassium: 4.4 mmol/L (ref 3.5–5.1)
Sodium: 131 mmol/L — ABNORMAL LOW (ref 135–145)

## 2020-01-26 LAB — CBC
HCT: 42.4 % (ref 39.0–52.0)
Hemoglobin: 13.6 g/dL (ref 13.0–17.0)
MCH: 24.6 pg — ABNORMAL LOW (ref 26.0–34.0)
MCHC: 32.1 g/dL (ref 30.0–36.0)
MCV: 76.7 fL — ABNORMAL LOW (ref 80.0–100.0)
Platelets: 241 10*3/uL (ref 150–400)
RBC: 5.53 MIL/uL (ref 4.22–5.81)
RDW: 15.9 % — ABNORMAL HIGH (ref 11.5–15.5)
WBC: 13.7 10*3/uL — ABNORMAL HIGH (ref 4.0–10.5)
nRBC: 0 % (ref 0.0–0.2)

## 2020-01-26 MED ORDER — POLYETHYLENE GLYCOL 3350 17 G PO PACK
17.0000 g | PACK | Freq: Every day | ORAL | Status: DC
  Administered 2020-01-26 – 2020-01-28 (×3): 17 g via ORAL
  Filled 2020-01-26 (×3): qty 1

## 2020-01-26 NOTE — Progress Notes (Signed)
Progress Note  2 Days Post-Op  Subjective: Patient reports a little more pain overnight. Denies nausea but no flatus or BM. Has been up and ambulating well. Good UOP since foley removal. VSS.   Objective: Vital signs in last 24 hours: Temp:  [97.7 F (36.5 C)-97.8 F (36.6 C)] 97.7 F (36.5 C) (12/08 2220) Pulse Rate:  [92-102] 92 (12/08 2220) Resp:  [16-20] 20 (12/08 2220) BP: (125-137)/(69-72) 134/69 (12/09 0902) SpO2:  [93 %-94 %] 93 % (12/08 2220) Last BM Date: 01/24/20  Intake/Output from previous day: 12/08 0701 - 12/09 0700 In: 3240.9 [P.O.:1080; I.V.:2160.9] Out: 2100 [Urine:2100] Intake/Output this shift: Total I/O In: 120 [P.O.:120] Out: -   PE: General: pleasant, WD, WN male who is laying in bed in NAD Heart: regular, rate, and rhythm.  Normal s1,s2. No obvious murmurs, gallops, or rubs noted.  Palpable radial and pedal pulses bilaterally Lungs: CTAB, no wheezes, rhonchi, or rales noted.  Respiratory effort nonlabored Abd: soft, mildly ttp in RLQ without peritonitis, ND, +BS, R groin incision c/d/i, no masses, hernias, or organomegaly MS: all 4 extremities are symmetrical with no cyanosis, clubbing, or edema. Skin: warm and dry with no masses, lesions, or rashes Neuro: Cranial nerves 2-12 grossly intact, sensation is normal throughout Psych: A&Ox3 with an appropriate affect.    Lab Results:  Recent Labs    01/25/20 0720 01/26/20 0515  WBC 20.5* 13.7*  HGB 15.7 13.6  HCT 47.7 42.4  PLT 281 241   BMET Recent Labs    01/25/20 0720 01/26/20 0515  NA 131* 131*  K 4.4 4.4  CL 96* 97*  CO2 24 29  GLUCOSE 166* 123*  BUN 13 14  CREATININE 0.81 0.71  CALCIUM 8.4* 8.4*   PT/INR Recent Labs    01/24/20 1915  LABPROT 13.4  INR 1.1   CMP     Component Value Date/Time   NA 131 (L) 01/26/2020 0515   K 4.4 01/26/2020 0515   CL 97 (L) 01/26/2020 0515   CO2 29 01/26/2020 0515   GLUCOSE 123 (H) 01/26/2020 0515   BUN 14 01/26/2020 0515    CREATININE 0.71 01/26/2020 0515   CALCIUM 8.4 (L) 01/26/2020 0515   PROT 7.7 01/24/2020 1915   ALBUMIN 4.2 01/24/2020 1915   AST 24 01/24/2020 1915   ALT 19 01/24/2020 1915   ALKPHOS 49 01/24/2020 1915   BILITOT 0.7 01/24/2020 1915   GFRNONAA >60 01/26/2020 0515   GFRAA >60 02/04/2018 0514   Lipase  No results found for: LIPASE     Studies/Results: CT ABDOMEN PELVIS WO CONTRAST  Result Date: 01/24/2020 CLINICAL DATA:  Incarcerated hernia.  Abdominal pain. EXAM: CT ABDOMEN AND PELVIS WITHOUT CONTRAST TECHNIQUE: Multidetector CT imaging of the abdomen and pelvis was performed following the standard protocol without IV contrast. COMPARISON:  None. FINDINGS: Lower chest: There is significant left lower lobe bronchial wall thickening and mucus plugging with a focal area of consolidation versus rounded atelectasis. There are adjacent calcified pleural based plaques with significant left lower lobe volume loss.The heart size is normal. Hepatobiliary: The liver is normal. Normal gallbladder.There is no biliary ductal dilation. Pancreas: Normal contours without ductal dilatation. No peripancreatic fluid collection. Spleen: Unremarkable. Adrenals/Urinary Tract: --Adrenal glands: Unremarkable. --Right kidney/ureter: No hydronephrosis or radiopaque kidney stones. --Left kidney/ureter: No hydronephrosis or radiopaque kidney stones. --Urinary bladder: Bladder is decompressed by Foley catheter. Stomach/Bowel: --Stomach/Duodenum: No hiatal hernia or other gastric abnormality. Normal duodenal course and caliber. --Small bowel: There a small bowel containing  right inguinal hernia causing some degree of obstruction. The herniated bowel loops demonstrate wall thickening and adjacent fat stranding with free fluid. There is no free air. --Colon: Rectosigmoid diverticulosis without acute inflammation. There is a large amount of stool in the colon. --Appendix: Normal. Vascular/Lymphatic: Atherosclerotic calcification is  present within the non-aneurysmal abdominal aorta, without hemodynamically significant stenosis. --No retroperitoneal lymphadenopathy. --No mesenteric lymphadenopathy. --No pelvic or inguinal lymphadenopathy. Reproductive: Unremarkable Other: No ascites or free air. There is a large left inguinal hernia. Musculoskeletal. There is a bilateral pars defect at L5 resulting in grade 1 anterolisthesis of L5 on S1. There is severe disc height loss at the L5-S1 level. IMPRESSION: 1. Small bowel containing right inguinal hernia causing some degree of obstruction. The herniated bowel loops demonstrate wall thickening and adjacent fat stranding with free fluid. No free air. Findings can represent incarceration/strangulation in the appropriate clinical setting. 2. Rectosigmoid diverticulosis without acute inflammation. 3. Large amount of stool in the colon. 4. Left lower lobe bronchial wall thickening and mucus plugging with a focal area of consolidation versus rounded atelectasis. This is likely chronic given an airspace opacity in this location on the patient's prior chest x-ray from 2019. However, a follow-up three-month contrast enhanced CT of the chest would be useful to confirm stability of this finding given there is no prior cross-sectional imaging available for comparison. 5. 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 Aortic Atherosclerosis (ICD10-I70.0). Electronically Signed   By: Katherine Mantle M.D.   On: 01/24/2020 20:09    Anti-infectives: Anti-infectives (From admission, onward)   Start     Dose/Rate Route Frequency Ordered Stop   01/25/20 0500  ceFAZolin (ANCEF) IVPB 2g/100 mL premix        2 g 200 mL/hr over 30 Minutes Intravenous Every 8 hours 01/25/20 0004 01/25/20 0537   01/24/20 2103  ceFAZolin (ANCEF) 2-4 GM/100ML-% IVPB       Note to Pharmacy: Johnette Abraham   : cabinet override      01/24/20 2103 01/24/20 2134   01/24/20 2015  ceFAZolin (ANCEF) IVPB  2g/100 mL premix        2 g 200 mL/hr over 30 Minutes Intravenous  Once 01/24/20 2014 01/24/20 2134       Assessment/Plan HTN Arthritis Narcolepsy  Incarcerated RIH S/p open RIH repair with mesh 01/24/20 Dr. Magnus Ivan - POD#2 - foley out and patient with good UOP - tolerating soft diet without nausea but still having some RLQ pain and no bowel function yet - continue to mobilize - d/c tramadol as patient does not seem to be getting much relief from this, scheduled tylenol and oxycodone prn for pain control  - possible discharge tomorrow if pain better and more bowel function and labs ok   FEN: SOFT, decreased IVF VTE: lovenox ID: Ancef periop  LOS: 2 days    Juliet Rude , [facility] 01/26/2020, 10:02 AM Please see Amion for pager number during day hours 7:00am-4:30pm

## 2020-01-27 ENCOUNTER — Inpatient Hospital Stay (HOSPITAL_COMMUNITY): Payer: Medicare Other

## 2020-01-27 LAB — CBC
HCT: 43.7 % (ref 39.0–52.0)
Hemoglobin: 14.3 g/dL (ref 13.0–17.0)
MCH: 25.2 pg — ABNORMAL LOW (ref 26.0–34.0)
MCHC: 32.7 g/dL (ref 30.0–36.0)
MCV: 76.9 fL — ABNORMAL LOW (ref 80.0–100.0)
Platelets: 256 10*3/uL (ref 150–400)
RBC: 5.68 MIL/uL (ref 4.22–5.81)
RDW: 15.4 % (ref 11.5–15.5)
WBC: 10.1 10*3/uL (ref 4.0–10.5)
nRBC: 0 % (ref 0.0–0.2)

## 2020-01-27 LAB — BASIC METABOLIC PANEL
Anion gap: 10 (ref 5–15)
BUN: 11 mg/dL (ref 8–23)
CO2: 27 mmol/L (ref 22–32)
Calcium: 8.4 mg/dL — ABNORMAL LOW (ref 8.9–10.3)
Chloride: 91 mmol/L — ABNORMAL LOW (ref 98–111)
Creatinine, Ser: 0.81 mg/dL (ref 0.61–1.24)
GFR, Estimated: >60 mL/min (ref >60)
Glucose, Bld: 125 mg/dL — ABNORMAL HIGH (ref 70–99)
Potassium: 4 mmol/L (ref 3.5–5.1)
Sodium: 128 mmol/L — ABNORMAL LOW (ref 135–145)

## 2020-01-27 MED ORDER — BISACODYL 10 MG RE SUPP
10.0000 mg | Freq: Once | RECTAL | Status: AC
  Administered 2020-01-27: 10 mg via RECTAL
  Filled 2020-01-27: qty 1

## 2020-01-27 MED ORDER — CALCIUM CARBONATE ANTACID 500 MG PO CHEW
1.0000 | CHEWABLE_TABLET | ORAL | Status: AC
  Administered 2020-01-27: 200 mg via ORAL
  Filled 2020-01-27: qty 1

## 2020-01-27 MED ORDER — SODIUM OXYBATE 500 MG/ML PO SOLN: 4500.0000 mg | Freq: Two times a day (BID) | ORAL | Status: DC | PRN

## 2020-01-27 MED ORDER — METHYLPHENIDATE HCL 20 MG PO TABS: 20.0000 mg | ORAL_TABLET | Freq: Every day | ORAL | Status: DC | PRN

## 2020-01-27 MED ORDER — SODIUM CHLORIDE 0.9% FLUSH
3.0000 mL | INTRAVENOUS | Status: DC | PRN
  Administered 2020-02-04: 22:00:00 3 mL via INTRAVENOUS

## 2020-01-27 MED ORDER — SODIUM CHLORIDE 0.9 % IV SOLN: 250.0000 mL | INTRAVENOUS | Status: DC | PRN

## 2020-01-27 MED ORDER — ALUM & MAG HYDROXIDE-SIMETH 200-200-20 MG/5ML PO SUSP
30.0000 mL | Freq: Four times a day (QID) | ORAL | Status: DC | PRN
  Administered 2020-01-27 – 2020-01-29 (×4): 30 mL via ORAL
  Filled 2020-01-27 (×4): qty 30

## 2020-01-27 MED ORDER — METOCLOPRAMIDE HCL 5 MG/ML IJ SOLN
10.0000 mg | Freq: Four times a day (QID) | INTRAMUSCULAR | Status: DC
  Administered 2020-01-27 – 2020-01-28 (×4): 10 mg via INTRAVENOUS
  Filled 2020-01-27 (×4): qty 2

## 2020-01-27 MED ORDER — SODIUM CHLORIDE 0.9% FLUSH
3.0000 mL | Freq: Two times a day (BID) | INTRAVENOUS | Status: DC
  Administered 2020-01-27 – 2020-02-04 (×15): 3 mL via INTRAVENOUS

## 2020-01-27 NOTE — Progress Notes (Signed)
Progress Note  3 Days Post-Op  Subjective: Patient still without any flatus or BM. A little more nauseated overnight, only ate small amounts yesterday. Still having some pain in RLQ but not worsened. Did not ambulate yesterday.   Objective: Vital signs in last 24 hours: Temp:  [97.5 F (36.4 C)-97.8 F (36.6 C)] 97.8 F (36.6 C) (12/10 0507) Pulse Rate:  [85-97] 97 (12/10 0507) Resp:  [16-18] 16 (12/10 0507) BP: (134-150)/(69-79) 144/75 (12/10 0507) SpO2:  [89 %-93 %] 93 % (12/10 0507) Last BM Date: 01/24/20  Intake/Output from previous day: 12/09 0701 - 12/10 0700 In: 989.5 [P.O.:120; I.V.:869.5] Out: -  Intake/Output this shift: No intake/output data recorded.  PE: General: pleasant, WD,WNmale who is laying in bed in NAD Heart: regular, rate, and rhythm. Normal s1,s2. No obvious murmurs, gallops, or rubs noted. Palpable radial and pedal pulses bilaterally Lungs: CTAB, no wheezes, rhonchi, or rales noted. Respiratory effort nonlabored Abd: soft,mildly ttp in RLQ without peritonitis, ND, +BS, R groin incision c/d/i, no masses, hernias, or organomegaly MS: all 4 extremities are symmetrical with no cyanosis, clubbing, or edema. Skin: warm and dry with no masses, lesions, or rashes Neuro: Cranial nerves 2-12 grossly intact, sensation is normal throughout Psych: A&Ox3 with an appropriate affect.   Lab Results:  Recent Labs    01/26/20 0515 01/27/20 0608  WBC 13.7* 10.1  HGB 13.6 14.3  HCT 42.4 43.7  PLT 241 256   BMET Recent Labs    01/26/20 0515 01/27/20 0608  NA 131* 128*  K 4.4 4.0  CL 97* 91*  CO2 29 27  GLUCOSE 123* 125*  BUN 14 11  CREATININE 0.71 0.81  CALCIUM 8.4* 8.4*   PT/INR Recent Labs    01/24/20 1915  LABPROT 13.4  INR 1.1   CMP     Component Value Date/Time   NA 128 (L) 01/27/2020 0608   K 4.0 01/27/2020 0608   CL 91 (L) 01/27/2020 0608   CO2 27 01/27/2020 0608   GLUCOSE 125 (H) 01/27/2020 0608   BUN 11 01/27/2020 0608    CREATININE 0.81 01/27/2020 0608   CALCIUM 8.4 (L) 01/27/2020 0608   PROT 7.7 01/24/2020 1915   ALBUMIN 4.2 01/24/2020 1915   AST 24 01/24/2020 1915   ALT 19 01/24/2020 1915   ALKPHOS 49 01/24/2020 1915   BILITOT 0.7 01/24/2020 1915   GFRNONAA >60 01/27/2020 0608   GFRAA >60 02/04/2018 0514   Lipase  No results found for: LIPASE     Studies/Results: No results found.  Anti-infectives: Anti-infectives (From admission, onward)   Start     Dose/Rate Route Frequency Ordered Stop   01/25/20 0500  ceFAZolin (ANCEF) IVPB 2g/100 mL premix        2 g 200 mL/hr over 30 Minutes Intravenous Every 8 hours 01/25/20 0004 01/25/20 0537   01/24/20 2103  ceFAZolin (ANCEF) 2-4 GM/100ML-% IVPB       Note to Pharmacy: Johnette Abraham   : cabinet override      01/24/20 2103 01/24/20 2134   01/24/20 2015  ceFAZolin (ANCEF) IVPB 2g/100 mL premix        2 g 200 mL/hr over 30 Minutes Intravenous  Once 01/24/20 2014 01/24/20 2134       Assessment/Plan HTN Arthritis Narcolepsy  Incarcerated RIH S/p open RIH repair with mesh 01/24/20 Dr. Magnus Ivan - POD#3 - a little more nausea overnight and no bowel function yet - check KUB this AM - continue to mobilize   FEN:  SOFT, SLIV VTE: lovenox ID: Ancef periop  LOS: 3 days    Juliet Rude , [facility] 01/27/2020, 7:43 AM Please see Amion for pager number during day hours 7:00am-4:30pm

## 2020-01-27 NOTE — Progress Notes (Signed)
Patient ambulated in room and in hall x 2 as well as up in chair most of the day. Patient stated he did pass some gas but no BM this shift.

## 2020-01-27 NOTE — Care Management Important Message (Signed)
Important Message  Patient Details IM Letter given to the Patient. Name: Kieron Kantner MRN: [id number] Date of Birth: Jul 06, 1945   Medicare Important Message Given:  Yes     Caren Macadam 01/27/2020, 1:07 PM

## 2020-01-27 NOTE — Plan of Care (Signed)
  Problem: Activity: Goal: Risk for activity intolerance will decrease Outcome: Progressing   Problem: Coping: Goal: Level of anxiety will decrease Outcome: Progressing   Problem: Pain Managment: Goal: General experience of comfort will improve Outcome: Progressing   

## 2020-01-28 ENCOUNTER — Inpatient Hospital Stay (HOSPITAL_COMMUNITY): Payer: Medicare Other

## 2020-01-28 ENCOUNTER — Encounter (HOSPITAL_COMMUNITY): Payer: Self-pay | Admitting: Surgery

## 2020-01-28 LAB — CBC
HCT: 45.7 % (ref 39.0–52.0)
Hemoglobin: 14.9 g/dL (ref 13.0–17.0)
MCH: 24.9 pg — ABNORMAL LOW (ref 26.0–34.0)
MCHC: 32.6 g/dL (ref 30.0–36.0)
MCV: 76.3 fL — ABNORMAL LOW (ref 80.0–100.0)
Platelets: 282 10*3/uL (ref 150–400)
RBC: 5.99 MIL/uL — ABNORMAL HIGH (ref 4.22–5.81)
RDW: 15.3 % (ref 11.5–15.5)
WBC: 7.7 10*3/uL (ref 4.0–10.5)
nRBC: 0 % (ref 0.0–0.2)

## 2020-01-28 LAB — BASIC METABOLIC PANEL
Anion gap: 15 (ref 5–15)
BUN: 21 mg/dL (ref 8–23)
CO2: 26 mmol/L (ref 22–32)
Calcium: 9.1 mg/dL (ref 8.9–10.3)
Chloride: 88 mmol/L — ABNORMAL LOW (ref 98–111)
Creatinine, Ser: 0.87 mg/dL (ref 0.61–1.24)
GFR, Estimated: >60 mL/min (ref >60)
Glucose, Bld: 149 mg/dL — ABNORMAL HIGH (ref 70–99)
Potassium: 4.2 mmol/L (ref 3.5–5.1)
Sodium: 129 mmol/L — ABNORMAL LOW (ref 135–145)

## 2020-01-28 MED ORDER — PANTOPRAZOLE SODIUM 40 MG IV SOLR
40.0000 mg | INTRAVENOUS | Status: DC
  Administered 2020-01-28 – 2020-02-14 (×18): 40 mg via INTRAVENOUS
  Filled 2020-01-28 (×18): qty 40

## 2020-01-28 MED ORDER — SODIUM OXYBATE 500 MG/ML PO SOLN
4500.0000 mg | Freq: Every day | ORAL | Status: DC | PRN
  Administered 2020-01-29 – 2020-01-30 (×2): 4500 mg via ORAL

## 2020-01-28 MED ORDER — SODIUM OXYBATE 500 MG/ML PO SOLN
4500.0000 mg | Freq: Every day | ORAL | Status: DC
  Administered 2020-01-28: 21:00:00 4500 mg via ORAL

## 2020-01-28 NOTE — Progress Notes (Addendum)
4 Days Post-Op   Subjective/Chief Complaint: States he feels better, some nausea but did have a couple bms, oob in chair   Objective: Vital signs in last 24 hours: Temp:  [98 F (36.7 C)-98.7 F (37.1 C)] 98 F (36.7 C) (12/11 0519) Pulse Rate:  [90-108] 108 (12/11 0519) Resp:  [16-18] 16 (12/11 0519) BP: (133-150)/(75-79) 133/75 (12/11 0519) SpO2:  [90 %-92 %] 91 % (12/11 0519) Last BM Date: 01/24/20  Intake/Output from previous day: No intake/output data recorded. Intake/Output this shift: No intake/output data recorded.  cv rrr Lungs clear Ab soft mildly distended, tender over right groin incisoin with ecchymosis  Lab Results:  Recent Labs    01/27/20 0608 01/28/20 0612  WBC 10.1 7.7  HGB 14.3 14.9  HCT 43.7 45.7  PLT 256 282   BMET Recent Labs    01/27/20 0608 01/28/20 0612  NA 128* 129*  K 4.0 4.2  CL 91* 88*  CO2 27 26  GLUCOSE 125* 149*  BUN 11 21  CREATININE 0.81 0.87  CALCIUM 8.4* 9.1   PT/INR No results for input(s): LABPROT, INR in the last 72 hours. ABG No results for input(s): PHART, HCO3 in the last 72 hours.  Invalid input(s): PCO2, PO2  Studies/Results: DG Abd Portable 1V  Result Date: 01/27/2020 CLINICAL DATA:  Ileus. Abdominal distension. Right inguinal hernia. EXAM: PORTABLE ABDOMEN - 1 VIEW COMPARISON:  CT of the abdomen pelvis 01/24/2020 FINDINGS: There is significant interval increase of diffuse small bowel distension. Loops of small bowel measure up to 4.5 cm. Gas is present in the colon. IMPRESSION: Progressive small-bowel obstruction. Electronically Signed   By: Marin Roberts M.D.   On: 01/27/2020 09:34    Anti-infectives: Anti-infectives (From admission, onward)   Start     Dose/Rate Route Frequency Ordered Stop   01/25/20 0500  ceFAZolin (ANCEF) IVPB 2g/100 mL premix        2 g 200 mL/hr over 30 Minutes Intravenous Every 8 hours 01/25/20 0004 01/25/20 0537   01/24/20 2103  ceFAZolin (ANCEF) 2-4 GM/100ML-% IVPB        Note to Pharmacy: Johnette Abraham   : cabinet override      01/24/20 2103 01/24/20 2134   01/24/20 2015  ceFAZolin (ANCEF) IVPB 2g/100 mL premix        2 g 200 mL/hr over 30 Minutes Intravenous  Once 01/24/20 2014 01/24/20 2134      Assessment/Plan: POD 4 open rih repair for incarcerated RIH- [place] -ileus on film yesterday, has had some bowel function and he thinks he is better -dont see clear need for operative intervention as hopefully this is just ileus now -will check film in am, consider repeat ct if not better soon -stop reglan FEN: clears SLIV VTE: lovenox  Emelia Loron 01/28/2020

## 2020-01-29 ENCOUNTER — Inpatient Hospital Stay (HOSPITAL_COMMUNITY): Payer: Medicare Other

## 2020-01-29 ENCOUNTER — Encounter (HOSPITAL_COMMUNITY): Payer: Self-pay | Admitting: Surgery

## 2020-01-29 MED ORDER — SODIUM OXYBATE 500 MG/ML PO SOLN
4500.0000 mg | ORAL | Status: DC
  Administered 2020-01-30: 02:00:00 4500 mg via ORAL

## 2020-01-29 MED ORDER — IOHEXOL 9 MG/ML PO SOLN
500.0000 mL | ORAL | Status: AC
  Administered 2020-01-29: 11:00:00 500 mL via ORAL

## 2020-01-29 MED ORDER — IOHEXOL 9 MG/ML PO SOLN
ORAL | Status: AC
  Administered 2020-01-29: 12:00:00 500 mL via ORAL
  Filled 2020-01-29: qty 1000

## 2020-01-29 MED ORDER — SODIUM CHLORIDE 0.9 % IV SOLN: INTRAVENOUS | Status: AC

## 2020-01-29 MED ORDER — IOHEXOL 300 MG/ML  SOLN
100.0000 mL | Freq: Once | INTRAMUSCULAR | Status: AC | PRN
  Administered 2020-01-29: 13:00:00 100 mL via INTRAVENOUS

## 2020-01-29 NOTE — Progress Notes (Signed)
5 Days Post-Op   Subjective/Chief Complaint: He feels about the same, some nausea and two episodes of emesis, not quite better yet   Objective: Vital signs in last 24 hours: Temp:  [97.4 F (36.3 C)-98.1 F (36.7 C)] 97.8 F (36.6 C) (12/12 0504) Pulse Rate:  [100-110] 100 (12/12 0504) Resp:  [18-20] 20 (12/12 0504) BP: (128-136)/(67-76) 136/74 (12/12 0504) SpO2:  [94 %-95 %] 94 % (12/12 0504) Last BM Date: 01/28/20  Intake/Output from previous day: No intake/output data recorded. Intake/Output this shift: No intake/output data recorded.  ab moderate distention, few bs, nontender, right groin with ecchymosis  Lab Results:  Recent Labs    01/27/20 0608 01/28/20 0612  WBC 10.1 7.7  HGB 14.3 14.9  HCT 43.7 45.7  PLT 256 282   BMET Recent Labs    01/27/20 0608 01/28/20 0612  NA 128* 129*  K 4.0 4.2  CL 91* 88*  CO2 27 26  GLUCOSE 125* 149*  BUN 11 21  CREATININE 0.81 0.87  CALCIUM 8.4* 9.1   PT/INR No results for input(s): LABPROT, INR in the last 72 hours. ABG No results for input(s): PHART, HCO3 in the last 72 hours.  Invalid input(s): PCO2, PO2  Studies/Results: DG Abd 1 View  Result Date: 01/28/2020 CLINICAL DATA:  Small bowel obstruction. EXAM: ABDOMEN - 1 VIEW COMPARISON:  January 27, 2020. FINDINGS: Stable small bowel dilatation is noted concerning for distal small bowel obstruction. No colonic dilatation is noted. No abnormal calcifications are noted. IMPRESSION: Stable small bowel dilatation is noted concerning for distal small bowel obstruction. Electronically Signed   By: Lupita Raider M.D.   On: 01/28/2020 16:29   DG Abd Portable 1V  Result Date: 01/27/2020 CLINICAL DATA:  Ileus. Abdominal distension. Right inguinal hernia. EXAM: PORTABLE ABDOMEN - 1 VIEW COMPARISON:  CT of the abdomen pelvis 01/24/2020 FINDINGS: There is significant interval increase of diffuse small bowel distension. Loops of small bowel measure up to 4.5 cm. Gas is  present in the colon. IMPRESSION: Progressive small-bowel obstruction. Electronically Signed   By: Marin Roberts M.D.   On: 01/27/2020 09:34    Anti-infectives: Anti-infectives (From admission, onward)   Start     Dose/Rate Route Frequency Ordered Stop   01/25/20 0500  ceFAZolin (ANCEF) IVPB 2g/100 mL premix        2 g 200 mL/hr over 30 Minutes Intravenous Every 8 hours 01/25/20 0004 01/25/20 0537   01/24/20 2103  ceFAZolin (ANCEF) 2-4 GM/100ML-% IVPB       Note to Pharmacy: Johnette Abraham   : cabinet override      01/24/20 2103 01/24/20 2134   01/24/20 2015  ceFAZolin (ANCEF) IVPB 2g/100 mL premix        2 g 200 mL/hr over 30 Minutes Intravenous  Once 01/24/20 2014 01/24/20 2134      Assessment/Plan: POD 5 open rih repair for incarcerated RIH- [facility] -ileus on film yesterday, has had some bowel function and he thinks he is better but still with emesis and distended -dont see clear need for operative intervention as hopefully this is just ileus now -I am going to get a CT scan today FEN: NPO VTE: lovenox  Emelia Loron 01/29/2020

## 2020-01-30 ENCOUNTER — Inpatient Hospital Stay (HOSPITAL_COMMUNITY): Payer: Medicare Other

## 2020-01-30 LAB — CBC
HCT: 40.5 % (ref 39.0–52.0)
Hemoglobin: 13.1 g/dL (ref 13.0–17.0)
MCH: 24.6 pg — ABNORMAL LOW (ref 26.0–34.0)
MCHC: 32.3 g/dL (ref 30.0–36.0)
MCV: 76 fL — ABNORMAL LOW (ref 80.0–100.0)
Platelets: 264 10*3/uL (ref 150–400)
RBC: 5.33 MIL/uL (ref 4.22–5.81)
RDW: 14.6 % (ref 11.5–15.5)
WBC: 5.9 10*3/uL (ref 4.0–10.5)
nRBC: 0 % (ref 0.0–0.2)

## 2020-01-30 LAB — BASIC METABOLIC PANEL
Anion gap: 13 (ref 5–15)
BUN: 27 mg/dL — ABNORMAL HIGH (ref 8–23)
CO2: 29 mmol/L (ref 22–32)
Calcium: 8.3 mg/dL — ABNORMAL LOW (ref 8.9–10.3)
Chloride: 89 mmol/L — ABNORMAL LOW (ref 98–111)
Creatinine, Ser: 0.75 mg/dL (ref 0.61–1.24)
GFR, Estimated: >60 mL/min (ref >60)
Glucose, Bld: 120 mg/dL — ABNORMAL HIGH (ref 70–99)
Potassium: 3.7 mmol/L (ref 3.5–5.1)
Sodium: 131 mmol/L — ABNORMAL LOW (ref 135–145)

## 2020-01-30 LAB — GLUCOSE, CAPILLARY
Glucose-Capillary: 118 mg/dL — ABNORMAL HIGH (ref 70–99)
Glucose-Capillary: 100 mg/dL — ABNORMAL HIGH (ref 70–99)
Glucose-Capillary: 98 mg/dL (ref 70–99)

## 2020-01-30 LAB — HEMOGLOBIN A1C
Hgb A1c MFr Bld: 5.8 % — ABNORMAL HIGH (ref 4.8–5.6)
Mean Plasma Glucose: 119.76 mg/dL

## 2020-01-30 MED ORDER — POTASSIUM CHLORIDE 10 MEQ/100ML IV SOLN
INTRAVENOUS | Status: AC
  Filled 2020-01-30: qty 100

## 2020-01-30 MED ORDER — LABETALOL HCL 5 MG/ML IV SOLN
5.0000 mg | Freq: Four times a day (QID) | INTRAVENOUS | Status: DC | PRN
  Filled 2020-01-30: qty 4

## 2020-01-30 MED ORDER — CHLORHEXIDINE GLUCONATE CLOTH 2 % EX PADS
6.0000 | MEDICATED_PAD | Freq: Every day | CUTANEOUS | Status: DC
  Administered 2020-01-30 – 2020-02-15 (×15): 6 via TOPICAL

## 2020-01-30 MED ORDER — INSULIN ASPART 100 UNIT/ML ~~LOC~~ SOLN: 0.0000 [IU] | Freq: Three times a day (TID) | SUBCUTANEOUS | Status: DC

## 2020-01-30 MED ORDER — POTASSIUM CHLORIDE 10 MEQ/100ML IV SOLN
10.0000 meq | INTRAVENOUS | Status: AC
  Administered 2020-01-30 (×4): 10 meq via INTRAVENOUS
  Filled 2020-01-30 (×3): qty 100

## 2020-01-30 MED ORDER — HYDRALAZINE HCL 20 MG/ML IJ SOLN
10.0000 mg | Freq: Four times a day (QID) | INTRAMUSCULAR | Status: DC
  Administered 2020-01-30 – 2020-02-03 (×16): 10 mg via INTRAVENOUS
  Filled 2020-01-30 (×16): qty 1

## 2020-01-30 NOTE — Plan of Care (Signed)
  Problem: Education: Goal: Knowledge of General Education information will improve Description: Including pain rating scale, medication(s)/side effects and non-pharmacologic comfort measures Outcome: Progressing   Problem: Clinical Measurements: Goal: Ability to maintain clinical measurements within normal limits will improve Outcome: Progressing   Problem: Clinical Measurements: Goal: Will remain free from infection Outcome: Progressing   Problem: Clinical Measurements: Goal: Diagnostic test results will improve Outcome: Progressing   Problem: Activity: Goal: Risk for activity intolerance will decrease Outcome: Progressing   Problem: Coping: Goal: Level of anxiety will decrease Outcome: Progressing   Problem: Pain Managment: Goal: General experience of comfort will improve Outcome: Progressing

## 2020-01-30 NOTE — Progress Notes (Signed)
6 Days Post-Op  Subjective: Difficult to understand at times, but he has the hiccoughs.  Denies nausea, but is burping like he is getting ready to throw up.  Minimal flatus, but no BM since prior to surgery.  States he is having minimal abdominal pain.  ROS: See above, otherwise other systems negative  Objective: Vital signs in last 24 hours: Temp:  [97.5 F (36.4 C)-98 F (36.7 C)] 97.5 F (36.4 C) (12/13 0501) Pulse Rate:  [87-90] 90 (12/13 0501) Resp:  [18-20] 18 (12/13 0501) BP: (133-143)/(67-69) 143/67 (12/13 0501) SpO2:  [95 %-97 %] 95 % (12/13 0501) Last BM Date: 01/28/20  Intake/Output from previous day: 12/12 0701 - 12/13 0700 In: 1544.5 [P.O.:250; I.V.:1294.5] Out: -  Intake/Output this shift: No intake/output data recorded.  PE: Heart: regular Lungs: CTAB, on small amount of O2 Abd: distended especially in upper abdomen, hypoactiveBS, appropriately tender, incision c/d/i, ecchymosis in his right groin down to his penis as expected.   Lab Results:  Recent Labs    01/28/20 0612 01/30/20 0502  WBC 7.7 5.9  HGB 14.9 13.1  HCT 45.7 40.5  PLT 282 264   BMET Recent Labs    01/28/20 0612 01/30/20 0502  NA 129* 131*  K 4.2 3.7  CL 88* 89*  CO2 26 29  GLUCOSE 149* 120*  BUN 21 27*  CREATININE 0.87 0.75  CALCIUM 9.1 8.3*   PT/INR No results for input(s): LABPROT, INR in the last 72 hours. CMP     Component Value Date/Time   NA 131 (L) 01/30/2020 0502   K 3.7 01/30/2020 0502   CL 89 (L) 01/30/2020 0502   CO2 29 01/30/2020 0502   GLUCOSE 120 (H) 01/30/2020 0502   BUN 27 (H) 01/30/2020 0502   CREATININE 0.75 01/30/2020 0502   CALCIUM 8.3 (L) 01/30/2020 0502   PROT 7.7 01/24/2020 1915   ALBUMIN 4.2 01/24/2020 1915   AST 24 01/24/2020 1915   ALT 19 01/24/2020 1915   ALKPHOS 49 01/24/2020 1915   BILITOT 0.7 01/24/2020 1915   GFRNONAA >60 01/30/2020 0502   GFRAA >60 02/04/2018 0514   Lipase  No results found for:  LIPASE     Studies/Results: DG Abd 1 View  Result Date: 01/28/2020 CLINICAL DATA:  Small bowel obstruction. EXAM: ABDOMEN - 1 VIEW COMPARISON:  January 27, 2020. FINDINGS: Stable small bowel dilatation is noted concerning for distal small bowel obstruction. No colonic dilatation is noted. No abnormal calcifications are noted. IMPRESSION: Stable small bowel dilatation is noted concerning for distal small bowel obstruction. Electronically Signed   By: Lupita Raider M.D.   On: 01/28/2020 16:29   CT ABDOMEN PELVIS W CONTRAST  Result Date: 01/29/2020 CLINICAL DATA:  Four days postop incarcerated bowel. Ileus. Vomiting. EXAM: CT ABDOMEN AND PELVIS WITH CONTRAST TECHNIQUE: Multidetector CT imaging of the abdomen and pelvis was performed using the standard protocol following bolus administration of intravenous contrast. CONTRAST:  OMNIPAQUE IOHEXOL 300 MG/ML  SOLN COMPARISON:  Radiograph yesterday.  CT 01/24/2020 FINDINGS: Lower chest: Progressive consolidation of both lung bases, with diffuse bronchial thickening on the left. Peripheral areas of geographic and nodular ground-glass in the lingula and right middle lobe, new from prior. No significant pleural effusion. Coronary artery calcifications and aortic atherosclerosis. Curvilinear diaphragmatic calcifications on the left. Hepatobiliary: Minimal fatty infiltration adjacent to the falciform ligament. No focal hepatic lesion. Mild gallbladder distension with trace pericholecystic fluid, series 2, image 32. No calcified gallstone. No biliary dilatation. Pancreas:  No ductal dilatation or inflammation. Spleen: Normal in size without focal abnormality. Adrenals/Urinary Tract: Left adrenal thickening without dominant nodule. Normal right adrenal gland. No hydronephrosis or perinephric edema. There is early excretion of IV contrast in both renal collecting systems. Urinary bladder appears normal. Stomach/Bowel: Stomach is markedly distended with ingested  contents. Small amount of fluid in the distal esophagus. Small bowel diffusely dilated and fluid-filled. There is transition from dilated to nondilated small bowel in the right lower quadrant, series 2, image 59. Distal multi ileal bowel loops are decompressed. The terminal ileum is patulous with fecalized contents. Interval right inguinal hernia repair without recurrent bowel involvement. Appendix is not well visualized on the current exam. Moderate stool burden in the colon. Distal colonic diverticulosis without acute diverticulitis. Vascular/Lymphatic: Aortic atherosclerosis. No aortic aneurysm. The portal vein is patent. Increased number of small central mesenteric lymph nodes, new from prior exam, likely reactive. Reproductive: Prominent prostate gland with coarse calcification. Other: Recent right inguinal hernia repair with stranding, foci of air, and ill-defined fluid in the surgical bed. There is no peripherally enhancing or draining fluid collection. Inflammatory changes track into the right inguinal canal where there is a small amount of non organized fluid. No intra-abdominal abscess. There is trace free fluid and stranding in the right lower quadrant. Few residual foci of free air under the right hemidiaphragm not unexpected given recent surgery. Increased generalized body wall edema. 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 IMPRESSION: 1. Post recent incarcerated right inguinal hernia repair. Small bowel diffusely dilated and fluid-filled with transition point in the right lower quadrant. Differential considerations include postoperative ileus or obstruction, the presence of a transition point fevers obstruction. 2. No residual recurrent bowel involvement of the right inguinal hernia. Recent postsurgical change in the soft tissues with fat stranding, patchy air and ill-defined  fluid. No abscess or drainable fluid collection. 3. Progressive consolidation of both lung bases, with diffuse bronchial thickening on the left. This may represent atelectasis or aspiration. New areas of geographic and nodular ground-glass in the lingula and right middle lobe, likely infectious or inflammatory, including aspiration. 4. Mild gallbladder distension with trace pericholecystic fluid. No calcified gallstone. If there is clinical concern for acute cholecystitis, recommend right upper quadrant ultrasound for further evaluation. 5. Colonic diverticulosis without acute diverticulitis. 6. 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 Aortic Atherosclerosis (ICD10-I70.0). Electronically Signed   By: Narda Rutherford M.D.   On: 01/29/2020 15:38    Anti-infectives: Anti-infectives (From admission, onward)   Start     Dose/Rate Route Frequency Ordered Stop   01/25/20 0500  ceFAZolin (ANCEF) IVPB 2g/100 mL premix        2 g 200 mL/hr over 30 Minutes Intravenous Every 8 hours 01/25/20 0004 01/25/20 0537   01/24/20 2103  ceFAZolin (ANCEF) 2-4 GM/100ML-% IVPB       Note to Pharmacy: Johnette Abraham   : cabinet override      01/24/20 2103 01/24/20 2134   01/24/20 2015  ceFAZolin (ANCEF) IVPB 2g/100 mL premix        2 g 200 mL/hr over 30 Minutes Intravenous  Once 01/24/20 2014 01/24/20 2134       Assessment/Plan DM - SSI, CBD checks HTN - will need to hold home meds due to NGT placement, will adjust to IV meds  POD 6, s/p open rih repair for incarcerated  RIHMagnus Lam -patient with persistent ileus vs obstruction from narrowing of bowel at the level it was obstructed secondary to inflammation. -patient's stomach is huge on CT scan and on physical exam.  Will place NGT for gastric decompression -continue to mobilize/pulm toilet.  Pulling about 1250-1500 on IS -discussed it was difficult to determine how long his NGT will need to remain in place.   Could be a couple of days to longer than that as needed.  FEN - NPO/IVFs/NGT VTE - lovenox ID - none currently needed   LOS: 6 days    Letha Cape , [facility] 01/30/2020, 9:05 AM Please see Amion for pager number during day hours 7:00am-4:30pm or 7:00am -11:30am on weekends

## 2020-01-30 NOTE — Care Management Important Message (Signed)
Important Message  Patient Details IM Letter given to the Patient. Name: Patrick Lam MRN: [id number] Date of Birth: 1945/04/06   Medicare Important Message Given:  Yes     Caren Macadam 01/30/2020, 1:32 PM

## 2020-01-31 ENCOUNTER — Inpatient Hospital Stay (HOSPITAL_COMMUNITY): Payer: Medicare Other

## 2020-01-31 LAB — BASIC METABOLIC PANEL
Anion gap: 14 (ref 5–15)
BUN: 23 mg/dL (ref 8–23)
CO2: 25 mmol/L (ref 22–32)
Calcium: 8.2 mg/dL — ABNORMAL LOW (ref 8.9–10.3)
Chloride: 96 mmol/L — ABNORMAL LOW (ref 98–111)
Creatinine, Ser: 0.66 mg/dL (ref 0.61–1.24)
GFR, Estimated: >60 mL/min (ref >60)
Glucose, Bld: 99 mg/dL (ref 70–99)
Potassium: 3.8 mmol/L (ref 3.5–5.1)
Sodium: 135 mmol/L (ref 135–145)

## 2020-01-31 LAB — GLUCOSE, CAPILLARY
Glucose-Capillary: 104 mg/dL — ABNORMAL HIGH (ref 70–99)
Glucose-Capillary: 95 mg/dL (ref 70–99)
Glucose-Capillary: 105 mg/dL — ABNORMAL HIGH (ref 70–99)
Glucose-Capillary: 100 mg/dL — ABNORMAL HIGH (ref 70–99)

## 2020-01-31 MED ORDER — SODIUM CHLORIDE 0.9 % IV SOLN
12.5000 mg | Freq: Four times a day (QID) | INTRAVENOUS | Status: DC | PRN
  Administered 2020-01-31: 14:00:00 12.5 mg via INTRAVENOUS
  Filled 2020-01-31: qty 0.5

## 2020-01-31 MED ORDER — DIATRIZOATE MEGLUMINE & SODIUM 66-10 % PO SOLN
90.0000 mL | Freq: Once | ORAL | Status: AC
  Administered 2020-01-31: 14:00:00 90 mL via NASOGASTRIC
  Filled 2020-01-31: qty 90

## 2020-01-31 NOTE — TOC Benefit Eligibility Note (Signed)
Transition of Care (TOC) Benefit Eligibility Note    Patient Details  Name: Socorro Foland MRN: [id number] Date of Birth: 03/31/1945   Medication/Dose: Sodium Oxybate Soln 4,500 mg  Covered?: No  Tier: Other  Prescription Coverage Preferred Pharmacy: [facility]  Spoke with Person/Company/Phone Number:: [facility] [employer] [facility] [phone]  Co-Pay: drug not on formulary requires prior auth [phone]  Prior Approval: Yes  Deductible: Met  Additional Notes: Xyrem is on formulary but requires prior auth also [phone]    Marti Acebo Phone Number: 01/31/2020, 1:06 PM     

## 2020-01-31 NOTE — Plan of Care (Signed)

## 2020-01-31 NOTE — Progress Notes (Signed)
7 Days Post-Op  Subjective: No new complaints today.  States he feels better since the NG tube has been placed.  He has had over 2 L out since placement yesterday.  He has only passing minimal flatus but no other bowel function.  Foley had to be replaced overnight secondary to urinary retention.  ROS: See above, otherwise other systems negative  Objective: Vital signs in last 24 hours: Temp:  [97.4 F (36.3 C)-98.3 F (36.8 C)] 98.3 F (36.8 C) (12/14 0510) Pulse Rate:  [91-97] 96 (12/14 0510) Resp:  [16-18] 16 (12/14 0510) BP: (139-154)/(61-70) 154/61 (12/14 0510) SpO2:  [95 %-99 %] 95 % (12/14 0510) Last BM Date: 01/28/20  Intake/Output from previous day: 12/13 0701 - 12/14 0700 In: 1811.1 [P.O.:10; I.V.:1625.7; IV Piggyback:175.4] Out: 3675 [Urine:1525; Emesis/NG output:2150] Intake/Output this shift: Total I/O In: 3 [I.V.:3] Out: -   PE: Abdomen: Soft, minimally tender, NG tube in place with dark brown, feculent almost appearing output.  Right inguinal incision is clean, dry, and intact with ecchymosis as expected. GU: Clear yellow urine in Foley bag  Lab Results:  Recent Labs    01/30/20 0502  WBC 5.9  HGB 13.1  HCT 40.5  PLT 264   BMET Recent Labs    01/30/20 0502 01/31/20 0512  NA 131* 135  K 3.7 3.8  CL 89* 96*  CO2 29 25  GLUCOSE 120* 99  BUN 27* 23  CREATININE 0.75 0.66  CALCIUM 8.3* 8.2*   PT/INR No results for input(s): LABPROT, INR in the last 72 hours. CMP     Component Value Date/Time   NA 135 01/31/2020 0512   K 3.8 01/31/2020 0512   CL 96 (L) 01/31/2020 0512   CO2 25 01/31/2020 0512   GLUCOSE 99 01/31/2020 0512   BUN 23 01/31/2020 0512   CREATININE 0.66 01/31/2020 0512   CALCIUM 8.2 (L) 01/31/2020 0512   PROT 7.7 01/24/2020 1915   ALBUMIN 4.2 01/24/2020 1915   AST 24 01/24/2020 1915   ALT 19 01/24/2020 1915   ALKPHOS 49 01/24/2020 1915   BILITOT 0.7 01/24/2020 1915   GFRNONAA >60 01/31/2020 0512   GFRAA >60 02/04/2018  0514   Lipase  No results found for: LIPASE     Studies/Results: DG Abd 1 View  Result Date: 01/30/2020 CLINICAL DATA:  Nasogastric tube placement.  Bowel obstruction EXAM: ABDOMEN - 1 VIEW COMPARISON:  CT abdomen and pelvis January 29, 2020; abdominal radiographs January 28, 2020 FINDINGS: Nasogastric tube tip and side port in stomach. Multiple loops of dilated small bowel remain. No appreciable air-fluid levels. No free air. There is atelectatic change in the right lung base. IMPRESSION: Nasogastric tube tip and side port in stomach. Bowel gas pattern indicative of persistent degree of bowel obstruction. No free air appreciable on supine examination. Electronically Signed   By: Bretta Bang III M.D.   On: 01/30/2020 10:23   CT ABDOMEN PELVIS W CONTRAST  Result Date: 01/29/2020 CLINICAL DATA:  Four days postop incarcerated bowel. Ileus. Vomiting. EXAM: CT ABDOMEN AND PELVIS WITH CONTRAST TECHNIQUE: Multidetector CT imaging of the abdomen and pelvis was performed using the standard protocol following bolus administration of intravenous contrast. CONTRAST:  OMNIPAQUE IOHEXOL 300 MG/ML  SOLN COMPARISON:  Radiograph yesterday.  CT 01/24/2020 FINDINGS: Lower chest: Progressive consolidation of both lung bases, with diffuse bronchial thickening on the left. Peripheral areas of geographic and nodular ground-glass in the lingula and right middle lobe, new from prior. No significant pleural  effusion. Coronary artery calcifications and aortic atherosclerosis. Curvilinear diaphragmatic calcifications on the left. Hepatobiliary: Minimal fatty infiltration adjacent to the falciform ligament. No focal hepatic lesion. Mild gallbladder distension with trace pericholecystic fluid, series 2, image 32. No calcified gallstone. No biliary dilatation. Pancreas: No ductal dilatation or inflammation. Spleen: Normal in size without focal abnormality. Adrenals/Urinary Tract: Left adrenal thickening without  dominant nodule. Normal right adrenal gland. No hydronephrosis or perinephric edema. There is early excretion of IV contrast in both renal collecting systems. Urinary bladder appears normal. Stomach/Bowel: Stomach is markedly distended with ingested contents. Small amount of fluid in the distal esophagus. Small bowel diffusely dilated and fluid-filled. There is transition from dilated to nondilated small bowel in the right lower quadrant, series 2, image 59. Distal multi ileal bowel loops are decompressed. The terminal ileum is patulous with fecalized contents. Interval right inguinal hernia repair without recurrent bowel involvement. Appendix is not well visualized on the current exam. Moderate stool burden in the colon. Distal colonic diverticulosis without acute diverticulitis. Vascular/Lymphatic: Aortic atherosclerosis. No aortic aneurysm. The portal vein is patent. Increased number of small central mesenteric lymph nodes, new from prior exam, likely reactive. Reproductive: Prominent prostate gland with coarse calcification. Other: Recent right inguinal hernia repair with stranding, foci of air, and ill-defined fluid in the surgical bed. There is no peripherally enhancing or draining fluid collection. Inflammatory changes track into the right inguinal canal where there is a small amount of non organized fluid. No intra-abdominal abscess. There is trace free fluid and stranding in the right lower quadrant. Few residual foci of free air under the right hemidiaphragm not unexpected given recent surgery. Increased generalized body wall edema. 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 IMPRESSION: 1. Post recent incarcerated right inguinal hernia repair. Small bowel diffusely dilated and fluid-filled with transition point in the right lower quadrant. Differential considerations include  postoperative ileus or obstruction, the presence of a transition point fevers obstruction. 2. No residual recurrent bowel involvement of the right inguinal hernia. Recent postsurgical change in the soft tissues with fat stranding, patchy air and ill-defined fluid. No abscess or drainable fluid collection. 3. Progressive consolidation of both lung bases, with diffuse bronchial thickening on the left. This may represent atelectasis or aspiration. New areas of geographic and nodular ground-glass in the lingula and right middle lobe, likely infectious or inflammatory, including aspiration. 4. Mild gallbladder distension with trace pericholecystic fluid. No calcified gallstone. If there is clinical concern for acute cholecystitis, recommend right upper quadrant ultrasound for further evaluation. 5. Colonic diverticulosis without acute diverticulitis. 6. 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 Aortic Atherosclerosis (ICD10-I70.0). Electronically Signed   By: Narda Rutherford M.D.   On: 01/29/2020 15:38    Anti-infectives: Anti-infectives (From admission, onward)   Start     Dose/Rate Route Frequency Ordered Stop   01/25/20 0500  ceFAZolin (ANCEF) IVPB 2g/100 mL premix        2 g 200 mL/hr over 30 Minutes Intravenous Every 8 hours 01/25/20 0004 01/25/20 0537   01/24/20 2103  ceFAZolin (ANCEF) 2-4 GM/100ML-% IVPB       Note to Pharmacy: Johnette Abraham   : cabinet override      01/24/20 2103 01/24/20 2134   01/24/20 2015  ceFAZolin (ANCEF) IVPB 2g/100 mL premix        2 g 200 mL/hr over 30 Minutes Intravenous  Once 01/24/20 2014 01/24/20 2134       Assessment/Plan DM - SSI, CBD checks HTN - will need to hold home meds due to NGT placement, will adjust to IV meds Urinary retention -leave Foley today.  Patient states he cannot avoid laying down.  I informed him he needs to be up and walking around.  We will leave this in today but tentatively plan to  remove tomorrow.  POD7, s/popen rih repair for incarcerated RIHMagnus Ivan -patient with significant NG tube output since placement yesterday.  He continues to have minimal bowel function. -If this persists, will likely plan to return to operating room tomorrow for diagnostic laparoscopy to evaluate the piece of bowel that was stuck in his hernia at the time of surgery.  This was discussed with the patient today he is in agreement with this plan. -Mobilize in the hall 3 times daily -IS/pulm toilet  FEN - NPO/IVFs/NGT VTE - lovenox ID - none currently needed   LOS: 7 days    Letha Cape , [facility] 01/31/2020, 11:16 AM Please see Amion for pager number during day hours 7:00am-4:30pm or 7:00am -11:30am on weekends

## 2020-01-31 NOTE — Plan of Care (Signed)
  Problem: Safety: Goal: Ability to remain free from injury will improve Outcome: Progressing   Problem: Skin Integrity: Goal: Risk for impaired skin integrity will decrease Outcome: Progressing   Problem: Clinical Measurements: Goal: Postoperative complications will be avoided or minimized Outcome: Progressing

## 2020-01-31 NOTE — TOC Benefit Eligibility Note (Signed)
Transition of Care [facility]) Benefit Eligibility Note    Patient Details  Name: Patrick Lam MRN: [id number] Date of Birth: 1945/03/06   Medication/Dose: Sodium Oxybate Soln 4,500 mg  Covered?: No  Tier: Other  Prescription Coverage Preferred Pharmacy: [facility]  Spoke with Person/Company/Phone Number:: [facility] [employer] [facility] [phone]  Co-Pay: drug not on formulary requires prior auth [phone]  Prior Approval: Yes  Deductible: Met  Additional Notes: Xyrem is on formulary but requires prior auth also [place], [place] Phone Number: 01/31/2020, 1:06 PM

## 2020-02-01 ENCOUNTER — Encounter (HOSPITAL_COMMUNITY): Admission: EM | Disposition: A | Discharge status: Skilled Nursing Facility | Payer: Self-pay | Source: Home / Self Care

## 2020-02-01 ENCOUNTER — Inpatient Hospital Stay: Payer: Self-pay

## 2020-02-01 ENCOUNTER — Inpatient Hospital Stay (HOSPITAL_COMMUNITY): Payer: Medicare Other | Admitting: Certified Registered Nurse Anesthetist

## 2020-02-01 ENCOUNTER — Encounter (HOSPITAL_COMMUNITY): Payer: Self-pay | Admitting: Surgery

## 2020-02-01 HISTORY — PX: LAPAROSCOPIC LYSIS OF ADHESIONS: SHX5905

## 2020-02-01 HISTORY — PX: COLON RESECTION: SHX5231

## 2020-02-01 LAB — BASIC METABOLIC PANEL
Anion gap: 12 (ref 5–15)
BUN: 23 mg/dL (ref 8–23)
CO2: 24 mmol/L (ref 22–32)
Calcium: 8.2 mg/dL — ABNORMAL LOW (ref 8.9–10.3)
Chloride: 102 mmol/L (ref 98–111)
Creatinine, Ser: 0.75 mg/dL (ref 0.61–1.24)
GFR, Estimated: >60 mL/min (ref >60)
Glucose, Bld: 89 mg/dL (ref 70–99)
Potassium: 3.5 mmol/L (ref 3.5–5.1)
Sodium: 138 mmol/L (ref 135–145)

## 2020-02-01 LAB — TYPE AND SCREEN
ABO/RH(D): A POS
Antibody Screen: NEGATIVE

## 2020-02-01 LAB — CBC
HCT: 39.8 % (ref 39.0–52.0)
Hemoglobin: 13 g/dL (ref 13.0–17.0)
MCH: 24.6 pg — ABNORMAL LOW (ref 26.0–34.0)
MCHC: 32.7 g/dL (ref 30.0–36.0)
MCV: 75.4 fL — ABNORMAL LOW (ref 80.0–100.0)
Platelets: 338 10*3/uL (ref 150–400)
RBC: 5.28 MIL/uL (ref 4.22–5.81)
RDW: 14.4 % (ref 11.5–15.5)
WBC: 9.6 10*3/uL (ref 4.0–10.5)
nRBC: 0 % (ref 0.0–0.2)

## 2020-02-01 LAB — PHOSPHORUS: Phosphorus: 4.7 mg/dL — ABNORMAL HIGH (ref 2.5–4.6)

## 2020-02-01 LAB — GLUCOSE, CAPILLARY
Glucose-Capillary: 89 mg/dL (ref 70–99)
Glucose-Capillary: 98 mg/dL (ref 70–99)
Glucose-Capillary: 110 mg/dL — ABNORMAL HIGH (ref 70–99)

## 2020-02-01 LAB — MAGNESIUM: Magnesium: 2.2 mg/dL (ref 1.7–2.4)

## 2020-02-01 SURGERY — COLON RESECTION LAPAROSCOPIC
Anesthesia: General | Site: Abdomen

## 2020-02-01 MED ORDER — PHENYLEPHRINE HCL (PRESSORS) 10 MG/ML IV SOLN
INTRAVENOUS | Status: AC
  Filled 2020-02-01: qty 1

## 2020-02-01 MED ORDER — ESMOLOL HCL 100 MG/10ML IV SOLN
INTRAVENOUS | Status: DC | PRN
  Administered 2020-02-01: 40 mg via INTRAVENOUS

## 2020-02-01 MED ORDER — SODIUM OXYBATE 500 MG/ML PO SOLN
4500.0000 mg | Freq: Every day | ORAL | Status: DC | PRN
  Administered 2020-02-03 – 2020-02-15 (×22): 4500 mg via ORAL

## 2020-02-01 MED ORDER — ACETAMINOPHEN 10 MG/ML IV SOLN
1000.0000 mg | Freq: Four times a day (QID) | INTRAVENOUS | Status: DC
  Administered 2020-02-01 – 2020-02-02 (×3): 1000 mg via INTRAVENOUS
  Filled 2020-02-01 (×4): qty 100

## 2020-02-01 MED ORDER — ACETAMINOPHEN 10 MG/ML IV SOLN
INTRAVENOUS | Status: AC
  Administered 2020-02-02: 18:00:00 1000 mg via INTRAVENOUS
  Filled 2020-02-01: qty 100

## 2020-02-01 MED ORDER — LACTATED RINGERS IR SOLN
Status: DC | PRN
  Administered 2020-02-01: 1000 mL

## 2020-02-01 MED ORDER — CEFAZOLIN SODIUM-DEXTROSE 2-3 GM-%(50ML) IV SOLR
INTRAVENOUS | Status: DC | PRN
  Administered 2020-02-01: 2 g via INTRAVENOUS

## 2020-02-01 MED ORDER — ONDANSETRON HCL 4 MG/2ML IJ SOLN
INTRAMUSCULAR | Status: DC | PRN
  Administered 2020-02-01: 4 mg via INTRAVENOUS

## 2020-02-01 MED ORDER — ONDANSETRON HCL 4 MG/2ML IJ SOLN
INTRAMUSCULAR | Status: AC
  Filled 2020-02-01: qty 2

## 2020-02-01 MED ORDER — LIDOCAINE-EPINEPHRINE (PF) 1 %-1:200000 IJ SOLN
INTRAMUSCULAR | Status: DC | PRN
  Administered 2020-02-01: 11 mL

## 2020-02-01 MED ORDER — LIDOCAINE 2% (20 MG/ML) 5 ML SYRINGE
INTRAMUSCULAR | Status: DC | PRN
  Administered 2020-02-01: 60 mg via INTRAVENOUS

## 2020-02-01 MED ORDER — ESMOLOL HCL 100 MG/10ML IV SOLN
INTRAVENOUS | Status: AC
  Filled 2020-02-01: qty 10

## 2020-02-01 MED ORDER — SODIUM OXYBATE 500 MG/ML PO SOLN
4500.0000 mg | ORAL | Status: DC
  Administered 2020-02-03 – 2020-02-05 (×3): 4500 mg via ORAL

## 2020-02-01 MED ORDER — DEXAMETHASONE SODIUM PHOSPHATE 10 MG/ML IJ SOLN
INTRAMUSCULAR | Status: AC
  Filled 2020-02-01: qty 1

## 2020-02-01 MED ORDER — LIDOCAINE HCL (PF) 2 % IJ SOLN
INTRAMUSCULAR | Status: AC
  Filled 2020-02-01: qty 5

## 2020-02-01 MED ORDER — ROCURONIUM BROMIDE 10 MG/ML (PF) SYRINGE
PREFILLED_SYRINGE | INTRAVENOUS | Status: DC | PRN
  Administered 2020-02-01: 80 mg via INTRAVENOUS
  Administered 2020-02-01: 20 mg via INTRAVENOUS

## 2020-02-01 MED ORDER — PROPOFOL 10 MG/ML IV BOLUS
INTRAVENOUS | Status: DC | PRN
  Administered 2020-02-01: 120 mg via INTRAVENOUS

## 2020-02-01 MED ORDER — DEXAMETHASONE SODIUM PHOSPHATE 10 MG/ML IJ SOLN
INTRAMUSCULAR | Status: DC | PRN
  Administered 2020-02-01: 4 mg via INTRAVENOUS

## 2020-02-01 MED ORDER — SUCCINYLCHOLINE CHLORIDE 200 MG/10ML IV SOSY
PREFILLED_SYRINGE | INTRAVENOUS | Status: DC | PRN
  Administered 2020-02-01: 100 mg via INTRAVENOUS

## 2020-02-01 MED ORDER — LACTATED RINGERS IV SOLN: INTRAVENOUS | Status: DC

## 2020-02-01 MED ORDER — PROPOFOL 10 MG/ML IV BOLUS
INTRAVENOUS | Status: AC
  Filled 2020-02-01: qty 20

## 2020-02-01 MED ORDER — FENTANYL CITRATE (PF) 250 MCG/5ML IJ SOLN
INTRAMUSCULAR | Status: DC | PRN
  Administered 2020-02-01 (×5): 50 ug via INTRAVENOUS

## 2020-02-01 MED ORDER — CEFAZOLIN SODIUM-DEXTROSE 2-4 GM/100ML-% IV SOLN
INTRAVENOUS | Status: AC
  Filled 2020-02-01: qty 100

## 2020-02-01 MED ORDER — ONDANSETRON HCL 4 MG/2ML IJ SOLN: 4.0000 mg | Freq: Once | INTRAMUSCULAR | Status: DC | PRN

## 2020-02-01 MED ORDER — ACETAMINOPHEN 10 MG/ML IV SOLN
INTRAVENOUS | Status: DC | PRN
  Administered 2020-02-01: 1000 mg via INTRAVENOUS

## 2020-02-01 MED ORDER — HYDROMORPHONE HCL 1 MG/ML IJ SOLN: 0.2500 mg | INTRAMUSCULAR | Status: DC | PRN

## 2020-02-01 MED ORDER — LACTATED RINGERS IV SOLN: INTRAVENOUS | Status: DC | PRN

## 2020-02-01 MED ORDER — MORPHINE SULFATE (PF) 2 MG/ML IV SOLN
1.0000 mg | INTRAVENOUS | Status: DC | PRN
  Administered 2020-02-01: 22:00:00 2 mg via INTRAVENOUS
  Administered 2020-02-02: 19:00:00 1 mg via INTRAVENOUS
  Administered 2020-02-02: 23:00:00 4 mg via INTRAVENOUS
  Administered 2020-02-02 (×2): 2 mg via INTRAVENOUS
  Administered 2020-02-03: 4 mg via INTRAVENOUS
  Administered 2020-02-04: 2 mg via INTRAVENOUS
  Filled 2020-02-01 (×4): qty 1
  Filled 2020-02-01: qty 2
  Filled 2020-02-01: qty 1
  Filled 2020-02-01: qty 2

## 2020-02-01 MED ORDER — FENTANYL CITRATE (PF) 250 MCG/5ML IJ SOLN
INTRAMUSCULAR | Status: AC
  Filled 2020-02-01: qty 5

## 2020-02-01 MED ORDER — MEPERIDINE HCL 50 MG/ML IJ SOLN
6.2500 mg | INTRAMUSCULAR | Status: DC | PRN
Start: 2020-02-01 — End: 2020-02-01

## 2020-02-01 MED ORDER — ROCURONIUM BROMIDE 10 MG/ML (PF) SYRINGE
PREFILLED_SYRINGE | INTRAVENOUS | Status: AC
  Filled 2020-02-01: qty 10

## 2020-02-01 SURGICAL SUPPLY — 70 items
APPLIER CLIP 5 13 M/L LIGAMAX5 (MISCELLANEOUS)
APPLIER CLIP ROT 10 11.4 M/L (STAPLE)
BLADE EXTENDED COATED 6.5IN (ELECTRODE) IMPLANT
CABLE HIGH FREQUENCY MONO STRZ (ELECTRODE) IMPLANT
CELLS DAT CNTRL 66122 CELL SVR (MISCELLANEOUS) IMPLANT
CHLORAPREP W/TINT 26 (MISCELLANEOUS) ×3 IMPLANT
CLIP APPLIE 5 13 M/L LIGAMAX5 (MISCELLANEOUS) IMPLANT
CLIP APPLIE ROT 10 11.4 M/L (STAPLE) IMPLANT
COUNTER NEEDLE 20 DBL MAG RED (NEEDLE) IMPLANT
COVER MAYO STAND STRL (DRAPES) IMPLANT
COVER SURGICAL LIGHT HANDLE (MISCELLANEOUS) ×3 IMPLANT
COVER WAND RF STERILE (DRAPES) IMPLANT
DECANTER SPIKE VIAL GLASS SM (MISCELLANEOUS) ×6 IMPLANT
DRAIN CHANNEL 19F RND (DRAIN) IMPLANT
DRAPE LAPAROSCOPIC ABDOMINAL (DRAPES) ×3 IMPLANT
DRSG OPSITE POSTOP 4X10 (GAUZE/BANDAGES/DRESSINGS) IMPLANT
DRSG OPSITE POSTOP 4X6 (GAUZE/BANDAGES/DRESSINGS) IMPLANT
DRSG OPSITE POSTOP 4X8 (GAUZE/BANDAGES/DRESSINGS) IMPLANT
ELECT REM PT RETURN 15FT ADLT (MISCELLANEOUS) ×3 IMPLANT
ENDOLOOP SUT PDS II  0 18 (SUTURE)
ENDOLOOP SUT PDS II 0 18 (SUTURE) IMPLANT
EVACUATOR SILICONE 100CC (DRAIN) IMPLANT
GAUZE SPONGE 4X4 12PLY STRL (GAUZE/BANDAGES/DRESSINGS) IMPLANT
GLOVE BIO SURGEON STRL SZ 6 (GLOVE) ×3 IMPLANT
GLOVE INDICATOR 6.5 STRL GRN (GLOVE) ×3 IMPLANT
GOWN STRL REUS W/ TWL XL LVL3 (GOWN DISPOSABLE) ×1 IMPLANT
GOWN STRL REUS W/TWL XL LVL3 (GOWN DISPOSABLE) ×2 IMPLANT
KIT TURNOVER KIT A (KITS) IMPLANT
LEGGING LITHOTOMY PAIR STRL (DRAPES) IMPLANT
PACK COLON (CUSTOM PROCEDURE TRAY) IMPLANT
PAD POSITIONING PINK XL (MISCELLANEOUS) ×3 IMPLANT
PENCIL SMOKE EVACUATOR (MISCELLANEOUS) IMPLANT
PORT LAP GEL ALEXIS MED 5-9CM (MISCELLANEOUS) IMPLANT
RTRCTR WOUND ALEXIS 18CM MED (MISCELLANEOUS)
SCISSORS LAP 5X35 DISP (ENDOMECHANICALS) ×3 IMPLANT
SEALER TISSUE G2 STRG ARTC 35C (ENDOMECHANICALS) IMPLANT
SET IRRIG TUBING LAPAROSCOPIC (IRRIGATION / IRRIGATOR) ×3 IMPLANT
SET TUBE SMOKE EVAC HIGH FLOW (TUBING) ×3 IMPLANT
SLEEVE SURGEON STRL (DRAPES) IMPLANT
SLEEVE XCEL OPT CAN 5 100 (ENDOMECHANICALS) ×6 IMPLANT
SPONGE LAP 18X18 RF (DISPOSABLE) IMPLANT
STAPLER VISISTAT 35W (STAPLE) IMPLANT
SURGILUBE 2OZ TUBE FLIPTOP (MISCELLANEOUS) IMPLANT
SUT ETHILON 2 0 PS N (SUTURE) IMPLANT
SUT MNCRL AB 4-0 PS2 18 (SUTURE) IMPLANT
SUT PDS AB 1 CTX 36 (SUTURE) IMPLANT
SUT PDS AB 1 TP1 96 (SUTURE) IMPLANT
SUT PROLENE 2 0 KS (SUTURE) IMPLANT
SUT SILK 2 0 (SUTURE)
SUT SILK 2 0 SH CR/8 (SUTURE) IMPLANT
SUT SILK 2-0 18XBRD TIE 12 (SUTURE) IMPLANT
SUT SILK 3 0 (SUTURE)
SUT SILK 3 0 SH CR/8 (SUTURE) IMPLANT
SUT SILK 3-0 18XBRD TIE 12 (SUTURE) IMPLANT
SUT VIC AB 2-0 SH 18 (SUTURE) IMPLANT
SUT VIC AB 3-0 SH 18 (SUTURE) IMPLANT
SUT VICRYL 2 0 18  UND BR (SUTURE)
SUT VICRYL 2 0 18 UND BR (SUTURE) IMPLANT
SUT VICRYL 3 0 BR 18  UND (SUTURE)
SUT VICRYL 3 0 BR 18 UND (SUTURE) IMPLANT
SYS LAPSCP GELPORT 120MM (MISCELLANEOUS)
SYSTEM LAPSCP GELPORT 120MM (MISCELLANEOUS) IMPLANT
TOWEL OR 17X26 10 PK STRL BLUE (TOWEL DISPOSABLE) ×3 IMPLANT
TOWEL OR NON WOVEN STRL DISP B (DISPOSABLE) ×3 IMPLANT
TRAY FOLEY MTR SLVR 16FR STAT (SET/KITS/TRAYS/PACK) ×3 IMPLANT
TROCAR BLADELESS OPT 5 100 (ENDOMECHANICALS) ×3 IMPLANT
TROCAR XCEL BLUNT TIP 100MML (ENDOMECHANICALS) IMPLANT
TROCAR XCEL NON-BLD 11X100MML (ENDOMECHANICALS) IMPLANT
TUBING CONNECTING 10 (TUBING) IMPLANT
TUBING CONNECTING 10' (TUBING)

## 2020-02-01 NOTE — Progress Notes (Signed)
Pt went to OR via stretcher.

## 2020-02-01 NOTE — Progress Notes (Signed)
PHARMACY - TOTAL PARENTERAL NUTRITION CONSULT NOTE   Indication: Bowel obstruction  Patient Measurements: Height: 6\' 1"  (185.4 cm) Weight: 81.6 kg (179 lb 14.3 oz) IBW/kg (Calculated) : 79.9 TPN AdjBW (KG): 81.6 Body mass index is 23.73 kg/m. Usual Weight:   Assessment: Patient is a 74 y.o M presented to the ED on 12/7 with abdominal pain.  Abd CT showed obstruction secondary to right inguinal hernia.  He underwent hernia repair on 12/7.  He subsequently developed n/v s/p procedure, placed on NPO, and NG placed on 12/13. Pharmacy consulted on 12/15 to dose TPN.  Glucose / Insulin: sSSI - cbgs 89 from BMET Electrolytes: K 3.5, Ca 8.2; other lytes wnl Renal: scr 0.75  LFTs / TGs: LFTs wnl with labs on 12/7 Prealbumin / albumin: prealbumin pending; Albumin 4.2 on 12/7 Intake / Output; MIVF: I/O -1005; NG output 1700 GI Imaging: - 12/7 abd 14/7 bowel containing right inguinal hernia causing some degree of obstruction - 12/12 abd CT:  Small bowel diffusely dilated and fluid-filled with transition point in the right lower quadrant. Differential considerations include postoperative ileus or obstruction, the presence of a transition point fevers obstruction. Surgeries / Procedures:  -12/7: repair of inguinal incarcerated hernia with mesh - 12/13: NG placement - 12/15: plan for exp lap today  Central access: pending PICC placement TPN start date: pending PICC placement  Nutritional Goals: f/u with recom. from dietician   Current Nutrition:  - NPO  Plan:  - spoke to 1/16, since TPN consult was entered late this morning, patient is going to OR today and labs (mag and phos) still pending, ok to start TPN tomorrow (12/16)   Ariyanah Aguado P 02/01/2020,11:24 AM

## 2020-02-01 NOTE — Anesthesia Preprocedure Evaluation (Signed)
Anesthesia Evaluation  Patient identified by MRN, date of birth, ID band Patient awake    Reviewed: Allergy & Precautions, NPO status , Patient's Chart, lab work & pertinent test results  Airway Mallampati: II  TM Distance: >3 FB Neck ROM: Full    Dental   Pulmonary Current Smoker and Patient abstained from smoking.,    Pulmonary exam normal        Cardiovascular hypertension, Pt. on medications Normal cardiovascular exam     Neuro/Psych    GI/Hepatic   Endo/Other  diabetes, Type 2  Renal/GU      Musculoskeletal   Abdominal   Peds  Hematology   Anesthesia Other Findings   Reproductive/Obstetrics                             Anesthesia Physical Anesthesia Plan  ASA: III  Anesthesia Plan: General   Post-op Pain Management:    Induction: Intravenous and Rapid sequence  PONV Risk Score and Plan: Ondansetron and Midazolam  Airway Management Planned: Oral ETT  Additional Equipment:   Intra-op Plan:   Post-operative Plan: Extubation in OR  Informed Consent: I have reviewed the patients History and Physical, chart, labs and discussed the procedure including the risks, benefits and alternatives for the proposed anesthesia with the patient or authorized representative who has indicated his/her understanding and acceptance.       Plan Discussed with: CRNA and Surgeon  Anesthesia Plan Comments:         Anesthesia Quick Evaluation

## 2020-02-01 NOTE — Progress Notes (Signed)
Day of Surgery  Subjective: No new complaints today.  Continues to have excessive NGT output.  1700 yesterday.  No real bowel function.  Mobilizing some.    ROS: See above, otherwise other systems negative  Objective: Vital signs in last 24 hours: Temp:  [97.3 F (36.3 C)-98.6 F (37 C)] 98.4 F (36.9 C) (12/15 0509) Pulse Rate:  [97-108] 97 (12/15 0509) Resp:  [16-20] 16 (12/15 0509) BP: (132-152)/(54-77) 152/77 (12/15 0509) SpO2:  [91 %-95 %] 95 % (12/15 0509) Last BM Date: 01/28/20  Intake/Output from previous day: 12/14 0701 - 12/15 0700 In: 695.1 [I.V.:670.1; IV Piggyback:25] Out: 1700 [Emesis/NG output:1700] Intake/Output this shift: Total I/O In: -  Out: 425 [Urine:425]  PE: Abdomen: Soft, minimally tender, NG tube in place with dark brown bilious output, 1700cc.  Right inguinal incision is clean, dry, and intact with ecchymosis as expected. GU: Clear yellow urine in Foley bag  Lab Results:  Recent Labs    01/30/20 0502 02/01/20 0603  WBC 5.9 9.6  HGB 13.1 13.0  HCT 40.5 39.8  PLT 264 338   BMET Recent Labs    01/31/20 0512 02/01/20 0603  NA 135 138  K 3.8 3.5  CL 96* 102  CO2 25 24  GLUCOSE 99 89  BUN 23 23  CREATININE 0.66 0.75  CALCIUM 8.2* 8.2*   PT/INR No results for input(s): LABPROT, INR in the last 72 hours. CMP     Component Value Date/Time   NA 138 02/01/2020 0603   K 3.5 02/01/2020 0603   CL 102 02/01/2020 0603   CO2 24 02/01/2020 0603   GLUCOSE 89 02/01/2020 0603   BUN 23 02/01/2020 0603   CREATININE 0.75 02/01/2020 0603   CALCIUM 8.2 (L) 02/01/2020 0603   PROT 7.7 01/24/2020 1915   ALBUMIN 4.2 01/24/2020 1915   AST 24 01/24/2020 1915   ALT 19 01/24/2020 1915   ALKPHOS 49 01/24/2020 1915   BILITOT 0.7 01/24/2020 1915   GFRNONAA >60 02/01/2020 0603   GFRAA >60 02/04/2018 0514   Lipase  No results found for: LIPASE     Studies/Results: DG Abd Portable 1V  Result Date: 01/31/2020 CLINICAL DATA:  NG tube  position EXAM: PORTABLE ABDOMEN - 1 VIEW COMPARISON:  01/31/2020 FINDINGS: Esophageal tube is looped over the proximal stomach, the tip projects over the mid gastric region. Dilated loops of small bowel measuring up to 5 cm consistent with bowel obstruction. IMPRESSION: Esophageal tube tip projects over the mid stomach. Dilated small bowel suspicious for bowel obstruction. Electronically Signed   By: Jasmine Pang M.D.   On: 01/31/2020 23:07   DG Abd Portable 1V-Small Bowel Obstruction Protocol-initial, 8 hr delay  Result Date: 01/31/2020 CLINICAL DATA:  Small bowel obstruction EXAM: PORTABLE ABDOMEN - 1 VIEW COMPARISON:  01/30/2020 FINDINGS: NG tube tip remains in the proximal stomach. Dilated central small bowel loops again noted, stable or slightly worsened since prior study. No organomegaly or free air. IMPRESSION: Continued dilated central small bowel loops concerning for high-grade small bowel obstruction, stable or slightly worsened since prior study. Electronically Signed   By: Charlett Nose M.D.   On: 01/31/2020 22:01   [place] EKG SITE RITE  Result Date: 02/01/2020 If Site Rite image not attached, placement could not be confirmed due to current cardiac rhythm.   Anti-infectives: Anti-infectives (From admission, onward)   Start     Dose/Rate Route Frequency Ordered Stop   01/25/20 0500  ceFAZolin (ANCEF) IVPB 2g/100 mL premix  2 g 200 mL/hr over 30 Minutes Intravenous Every 8 hours 01/25/20 0004 01/25/20 0537   01/24/20 2103  ceFAZolin (ANCEF) 2-4 GM/100ML-% IVPB       Note to Pharmacy: Johnette Abraham   : cabinet override      01/24/20 2103 01/24/20 2134   01/24/20 2015  ceFAZolin (ANCEF) IVPB 2g/100 mL premix        2 g 200 mL/hr over 30 Minutes Intravenous  Once 01/24/20 2014 01/24/20 2134       Assessment/Plan DM - SSI, CBD checks HTN - will need to hold home meds due to NGT placement, will adjust to IV meds Urinary retention -leave Foley today.  Will try to DC tomorrow  post op  POD8, s/popen rih repair for incarcerated RIH- Patrick Lam -patient continues with essentially a bowel obstruction despite SBO protocol yesterday. -will proceed with Dx lap possible ex lap with SBR today to correct this issue.  Explained to the patient and he understands and agrees to proceed -Mobilize in the hall 3 times daily -IS/pulm toilet -no oral intake now for 8 days.  Start PICC/TNA  FEN - NPO/IVFs/NGT VTE - lovenox ID - none currently needed Dispo - OR today   LOS: 8 days    Letha Cape , [facility] Surgery 02/01/2020, 11:19 AM Please see Amion for pager number during day hours 7:00am-4:30pm or 7:00am -11:30am on weekends Patient ID: Patrick Lam, male   DOB: 06/16/1945, 74 y.o.   MRN: [id number]

## 2020-02-01 NOTE — Op Note (Signed)
PRE-OPERATIVE DIAGNOSIS: small bowel obstruction  POST-OPERATIVE DIAGNOSIS:  Same  PROCEDURE:  Procedure(s): Laparoscopic lysis of adhesions x 90 min  SURGEON:  Surgeon(s): Almond Lint, MD  ANESTHESIA:   general  DRAINS: none   LOCAL MEDICATIONS USED:  BUPIVICAINE  and LIDOCAINE   SPECIMEN:  No Specimen  DISPOSITION OF SPECIMEN:  N/A  COUNTS:  YES  DICTATION: .Dragon Dictation  PLAN OF CARE: back to floor after PACU  PATIENT DISPOSITION:  PACU - hemodynamically stable.  FINDINGS:  Dense adhesions in the pelvis and the RLQ causing obstruction  EBL: min  PROCEDURE:   Patient was identified in holding area and taken operating room where he was placed supine on the operating room table.  Pneumatic compression devices were placed.  General endotracheal anesthesia was induced.  Patient's arms were tucked.   The patient's abdomen was then prepped and draped in sterile fashion.  A timeout was performed according to the surgical safety checklist.  When all was correct, we continued.  The patient was placed into reverse Trendelenburg position and rotated to the right.  Local anesthetic was infiltrated at the left costal margin.  A 5 mm Optiview trocar was placed under direct visualization.  Pneumoperitoneum was achieved to a pressure of 15 mmHg.  The camera was introduced into the abdomen and there were multiple sites of adhesions to the abdominal wall.  There was a good window and the left lower quadrant and an additional 5 mm trocar was placed there.  Adhesiolysis was performed on the abdominal wall and asked to get a third trocar in the left mid abdomen.  The patient was then placed into Trendelenburg position and rotated to the left.  The bowel was pulled up as much as possible out of the pelvis.  There were multiple loops stuck in the right lower quadrant and pelvis.  It appeared that 1 loop was stuck at the posterior abdominal wall where the hernia sac had been.  This was reduced  relatively easily.  However, there were still loops stuck down to the right pelvic sidewall.  Care was taken to gently release the filmy adhesions and then sharply released the denser adhesions.  90 minutes of adhesiolysis was required.  This was performed until we did make it all the way to the terminal ileum.  The bowel was then run backwards to the ligament of Treitz to confirm that there was no evidence of bowel injury.  The bowel was run back distally again to evaluate.  The bowel was placed back into the pelvis and the omentum pulled down as much as it would.  Pneumoperitoneum was then released from the abdomen.  Trocars were removed.  The skin of the incisions was then closed with 4-0 Monocryl in subcuticular fashion.  The wounds were cleaned, dried, and dressed with Dermabond.  The patient was allowed to emerge from anesthesia and taken to the PACU in stable condition.  Needle, sponge, and instrument counts were correct x2.

## 2020-02-01 NOTE — Plan of Care (Signed)

## 2020-02-01 NOTE — Anesthesia Procedure Notes (Signed)
Procedure Name: Intubation Date/Time: 02/01/2020 1:36 PM Performed by: Lorelee Market, CRNA Pre-anesthesia Checklist: Patient identified, Emergency Drugs available, Suction available and Patient being monitored Patient Re-evaluated:Patient Re-evaluated prior to induction Oxygen Delivery Method: Circle system utilized Preoxygenation: Pre-oxygenation with 100% oxygen Induction Type: IV induction, Rapid sequence and Cricoid Pressure applied Ventilation: Mask ventilation without difficulty Laryngoscope Size: Glidescope and 4 Grade View: Grade I Tube type: Oral Tube size: 7.5 mm Number of attempts: 1 Airway Equipment and Method: Video-laryngoscopy Placement Confirmation: ETT inserted through vocal cords under direct vision,  positive ETCO2 and breath sounds checked- equal and bilateral Secured at: 24 cm Tube secured with: Tape Dental Injury: Teeth and Oropharynx as per pre-operative assessment

## 2020-02-01 NOTE — Transfer of Care (Signed)
Immediate Anesthesia Transfer of Care Note  Patient: Patrick Lam  Procedure(s) Performed: DIAGNOSTIC  LAPAROSCOPY (N/A Abdomen) LAPAROSCOPIC LYSIS OF ADHESIONS (N/A Abdomen)  Patient Location: PACU  Anesthesia Type:General  Level of Consciousness: sedated, patient cooperative and responds to stimulation  Airway & Oxygen Therapy: Patient Spontanous Breathing and Patient connected to face mask oxygen  Post-op Assessment: Report given to RN and Post -op Vital signs reviewed and stable  Post vital signs: Reviewed and stable  Last Vitals:  Vitals Value Taken Time  BP 137/70 02/01/20 1622  Temp    Pulse 92 02/01/20 1624  Resp 25 02/01/20 1624  SpO2 98 % 02/01/20 1624  Vitals shown include unvalidated device data.  Last Pain:  Vitals:   02/01/20 1145  TempSrc: Oral  PainSc: 0-No pain      Patients Stated Pain Goal: 4 (02/01/20 1145)  Complications: No complications documented.

## 2020-02-01 NOTE — Anesthesia Postprocedure Evaluation (Signed)
Anesthesia Post Note  Patient: Patrick Lam  Procedure(s) Performed: DIAGNOSTIC  LAPAROSCOPY (N/A Abdomen) LAPAROSCOPIC LYSIS OF ADHESIONS (N/A Abdomen)     Patient location during evaluation: PACU Anesthesia Type: General Level of consciousness: awake and alert, oriented and patient cooperative Pain management: pain level controlled Vital Signs Assessment: post-procedure vital signs reviewed and stable Respiratory status: spontaneous breathing, nonlabored ventilation and respiratory function stable Cardiovascular status: blood pressure returned to baseline and stable Postop Assessment: no apparent nausea or vomiting Anesthetic complications: no   No complications documented.  Last Vitals:  Vitals:   02/01/20 1630 02/01/20 1645  BP: (!) 147/69 (!) 145/70  Pulse: 96 (!) 101  Resp: 20 (!) 21  Temp:    SpO2: 98% 92%    Last Pain:  Vitals:   02/01/20 1645  TempSrc:   PainSc: Asleep                 Lannie Fields

## 2020-02-02 ENCOUNTER — Encounter (HOSPITAL_COMMUNITY): Payer: Self-pay | Admitting: General Surgery

## 2020-02-02 LAB — CBC WITH DIFFERENTIAL/PLATELET
Abs Immature Granulocytes: 0.38 10*3/uL — ABNORMAL HIGH (ref 0.00–0.07)
Basophils Absolute: 0.1 10*3/uL (ref 0.0–0.1)
Basophils Relative: 1 %
Eosinophils Absolute: 0 10*3/uL (ref 0.0–0.5)
Eosinophils Relative: 0 %
HCT: 40.2 % (ref 39.0–52.0)
Hemoglobin: 12.9 g/dL — ABNORMAL LOW (ref 13.0–17.0)
Immature Granulocytes: 3 %
Lymphocytes Relative: 13 %
Lymphs Abs: 1.7 10*3/uL (ref 0.7–4.0)
MCH: 24.4 pg — ABNORMAL LOW (ref 26.0–34.0)
MCHC: 32.1 g/dL (ref 30.0–36.0)
MCV: 76.1 fL — ABNORMAL LOW (ref 80.0–100.0)
Monocytes Absolute: 0.7 10*3/uL (ref 0.1–1.0)
Monocytes Relative: 5 %
Neutro Abs: 10.7 10*3/uL — ABNORMAL HIGH (ref 1.7–7.7)
Neutrophils Relative %: 78 %
Platelets: 358 10*3/uL (ref 150–400)
RBC: 5.28 MIL/uL (ref 4.22–5.81)
RDW: 14.7 % (ref 11.5–15.5)
WBC: 13.6 10*3/uL — ABNORMAL HIGH (ref 4.0–10.5)
nRBC: 0 % (ref 0.0–0.2)

## 2020-02-02 LAB — GLUCOSE, CAPILLARY
Glucose-Capillary: 102 mg/dL — ABNORMAL HIGH (ref 70–99)
Glucose-Capillary: 134 mg/dL — ABNORMAL HIGH (ref 70–99)
Glucose-Capillary: 76 mg/dL (ref 70–99)
Glucose-Capillary: 90 mg/dL (ref 70–99)

## 2020-02-02 LAB — COMPREHENSIVE METABOLIC PANEL
ALT: 23 U/L (ref 0–44)
AST: 23 U/L (ref 15–41)
Albumin: 2.4 g/dL — ABNORMAL LOW (ref 3.5–5.0)
Alkaline Phosphatase: 49 U/L (ref 38–126)
Anion gap: 10 (ref 5–15)
BUN: 22 mg/dL (ref 8–23)
CO2: 25 mmol/L (ref 22–32)
Calcium: 8.2 mg/dL — ABNORMAL LOW (ref 8.9–10.3)
Chloride: 103 mmol/L (ref 98–111)
Creatinine, Ser: 0.69 mg/dL (ref 0.61–1.24)
GFR, Estimated: >60 mL/min (ref >60)
Glucose, Bld: 98 mg/dL (ref 70–99)
Potassium: 4 mmol/L (ref 3.5–5.1)
Sodium: 138 mmol/L (ref 135–145)
Total Bilirubin: 0.7 mg/dL (ref 0.3–1.2)
Total Protein: 5.5 g/dL — ABNORMAL LOW (ref 6.5–8.1)

## 2020-02-02 LAB — TRIGLYCERIDES: Triglycerides: 74 mg/dL (ref <150)

## 2020-02-02 LAB — PREALBUMIN: Prealbumin: 7.5 mg/dL — ABNORMAL LOW (ref 18–38)

## 2020-02-02 LAB — MAGNESIUM: Magnesium: 2 mg/dL (ref 1.7–2.4)

## 2020-02-02 LAB — PHOSPHORUS: Phosphorus: 3.7 mg/dL (ref 2.5–4.6)

## 2020-02-02 MED ORDER — SODIUM CHLORIDE 0.9% FLUSH
10.0000 mL | Freq: Two times a day (BID) | INTRAVENOUS | Status: DC
  Administered 2020-02-04 (×2): 10 mL

## 2020-02-02 MED ORDER — SODIUM CHLORIDE 0.9% FLUSH
10.0000 mL | INTRAVENOUS | Status: DC | PRN
  Administered 2020-02-04 – 2020-02-14 (×3): 10 mL

## 2020-02-02 MED ORDER — INSULIN ASPART 100 UNIT/ML ~~LOC~~ SOLN
0.0000 [IU] | Freq: Four times a day (QID) | SUBCUTANEOUS | Status: DC
  Administered 2020-02-02 – 2020-02-04 (×5): 1 [IU] via SUBCUTANEOUS
  Administered 2020-02-04: 12:00:00 2 [IU] via SUBCUTANEOUS
  Administered 2020-02-05: 06:00:00 1 [IU] via SUBCUTANEOUS
  Administered 2020-02-05: 01:00:00 2 [IU] via SUBCUTANEOUS

## 2020-02-02 MED ORDER — SODIUM CHLORIDE 0.9 % IV SOLN: INTRAVENOUS | Status: AC

## 2020-02-02 MED ORDER — TRAVASOL 10 % IV SOLN
INTRAVENOUS | Status: AC
  Filled 2020-02-02: qty 480

## 2020-02-02 MED ORDER — ACETAMINOPHEN 10 MG/ML IV SOLN
1000.0000 mg | Freq: Four times a day (QID) | INTRAVENOUS | Status: AC
  Administered 2020-02-02 – 2020-02-03 (×3): 1000 mg via INTRAVENOUS
  Filled 2020-02-02 (×4): qty 100

## 2020-02-02 NOTE — Progress Notes (Signed)
PHARMACY - TOTAL PARENTERAL NUTRITION CONSULT NOTE   Indication: Bowel obstruction  Patient Measurements: Height: 6\' 1"  (185.4 cm) Weight: 81.6 kg (179 lb 14.3 oz) IBW/kg (Calculated) : 79.9 TPN AdjBW (KG): 81.6 Body mass index is 23.73 kg/m. Usual Weight:   Assessment: Patient is a 74 y.o M presented to the ED on 12/7 with abdominal pain.  Abd CT showed obstruction secondary to right inguinal hernia.  He underwent hernia repair on 12/7.  He subsequently developed n/v s/p procedure, placed on NPO, and NG placed on 12/13. Pharmacy consulted on 12/15 to dose TPN.  Glucose / Insulin: sSSI - cbgs < 150  Electrolytes: K 4.0, CorrCa 9.5, WNL; other lytes wnl Renal: scr <1 LFTs / TGs: LFTs wnl, 12/16 triglycerides 74 Prealbumin / albumin: prealbumin 7.5; Albumin 2.4 Intake / Output; MIVF: I/O +1321; NG output 150 GI Imaging: - 12/7 abd 14/7 bowel containing right inguinal hernia causing some degree of obstruction - 12/12 abd CT:  Small bowel diffusely dilated and fluid-filled with transition point in the right lower quadrant. Differential considerations include postoperative ileus or obstruction, the presence of a transition point fevers obstruction. Surgeries / Procedures:  -12/7: repair of inguinal incarcerated hernia with mesh - 12/13: NG placement - 12/15: plan for exp lap today  Central access: pending PICC placement TPN start date: pending PICC placement  Nutritional Goals: f/u with recom. from dietician  --- Pending   Current Nutrition:  - NPO  Plan:   Start TPN at 20mL/hr at 1800  Electrolytes in TPN: 73mEq/L of Na, 73mEq/L of K, 57mEq/L of Ca, 23mEq/L of Mg, and 63mmol/L of Phos. Cl:Ac ratio 1:1  Add standard MVI and trace elements to TPN  Initiate Sensitive q6h SSI and adjust as needed   Reduce MIVF to 35 mL/hr at 1800  BMP, magnesium, phosphate with AM labs   Monitor TPN labs on Mon/Thurs    12m, PharmD, BCPS 02/02/2020 11:18 AM

## 2020-02-02 NOTE — Progress Notes (Signed)
1 Day Post-Op  Subjective: Some pain today.  No flatus.  Up in chair.   ROS: See above, otherwise other systems negative  Objective: Vital signs in last 24 hours: Temp:  [97.5 F (36.4 C)-98.7 F (37.1 C)] 97.7 F (36.5 C) (12/16 0922) Pulse Rate:  [89-106] 100 (12/16 0922) Resp:  [14-25] 18 (12/16 0922) BP: (121-156)/(54-73) 156/60 (12/16 0922) SpO2:  [92 %-99 %] 93 % (12/16 0922) Last BM Date:  (Stated it was sometime last week)  Intake/Output from previous day: 12/15 0701 - 12/16 0700 In: 2696.1 [P.O.:280; I.V.:2016.1; IV Piggyback:400] Out: 1375 [Urine:1175; Emesis/NG output:150; Blood:50] Intake/Output this shift: Total I/O In: 120 [P.O.:120] Out: 250 [Emesis/NG output:250]  PE: Abd: soft, less distended, NGT with much less bilious output and now with clear output in tubing, incisions c/d/i, few BS GU: foley in place with clear yellow urine  Lab Results:  Recent Labs    02/01/20 0603 02/02/20 0515  WBC 9.6 13.6*  HGB 13.0 12.9*  HCT 39.8 40.2  PLT 338 358   BMET Recent Labs    02/01/20 0603 02/02/20 0512  NA 138 138  K 3.5 4.0  CL 102 103  CO2 24 25  GLUCOSE 89 98  BUN 23 22  CREATININE 0.75 0.69  CALCIUM 8.2* 8.2*   PT/INR No results for input(s): LABPROT, INR in the last 72 hours. CMP     Component Value Date/Time   NA 138 02/02/2020 0512   K 4.0 02/02/2020 0512   CL 103 02/02/2020 0512   CO2 25 02/02/2020 0512   GLUCOSE 98 02/02/2020 0512   BUN 22 02/02/2020 0512   CREATININE 0.69 02/02/2020 0512   CALCIUM 8.2 (L) 02/02/2020 0512   PROT 5.5 (L) 02/02/2020 0512   ALBUMIN 2.4 (L) 02/02/2020 0512   AST 23 02/02/2020 0512   ALT 23 02/02/2020 0512   ALKPHOS 49 02/02/2020 0512   BILITOT 0.7 02/02/2020 0512   GFRNONAA >60 02/02/2020 0512   GFRAA >60 02/04/2018 0514   Lipase  No results found for: LIPASE     Studies/Results: DG Abd Portable 1V  Result Date: 01/31/2020 CLINICAL DATA:  NG tube position EXAM: PORTABLE ABDOMEN -  1 VIEW COMPARISON:  01/31/2020 FINDINGS: Esophageal tube is looped over the proximal stomach, the tip projects over the mid gastric region. Dilated loops of small bowel measuring up to 5 cm consistent with bowel obstruction. IMPRESSION: Esophageal tube tip projects over the mid stomach. Dilated small bowel suspicious for bowel obstruction. Electronically Signed   By: Jasmine Pang M.D.   On: 01/31/2020 23:07   DG Abd Portable 1V-Small Bowel Obstruction Protocol-initial, 8 hr delay  Result Date: 01/31/2020 CLINICAL DATA:  Small bowel obstruction EXAM: PORTABLE ABDOMEN - 1 VIEW COMPARISON:  01/30/2020 FINDINGS: NG tube tip remains in the proximal stomach. Dilated central small bowel loops again noted, stable or slightly worsened since prior study. No organomegaly or free air. IMPRESSION: Continued dilated central small bowel loops concerning for high-grade small bowel obstruction, stable or slightly worsened since prior study. Electronically Signed   By: Charlett Nose M.D.   On: 01/31/2020 22:01   [place] EKG SITE RITE  Result Date: 02/01/2020 If Site Rite image not attached, placement could not be confirmed due to current cardiac rhythm.   Anti-infectives: Anti-infectives (From admission, onward)   Start     Dose/Rate Route Frequency Ordered Stop   02/01/20 1345  ceFAZolin (ANCEF) 2-4 GM/100ML-% IVPB       Note to  Pharmacy: Sharyn Creamer   : cabinet override      02/01/20 1345 02/02/20 0159   01/25/20 0500  ceFAZolin (ANCEF) IVPB 2g/100 mL premix        2 g 200 mL/hr over 30 Minutes Intravenous Every 8 hours 01/25/20 0004 01/25/20 0537   01/24/20 2103  ceFAZolin (ANCEF) 2-4 GM/100ML-% IVPB       Note to Pharmacy: Johnette Abraham   : cabinet override      01/24/20 2103 01/24/20 2134   01/24/20 2015  ceFAZolin (ANCEF) IVPB 2g/100 mL premix        2 g 200 mL/hr over 30 Minutes Intravenous  Once 01/24/20 2014 01/24/20 2134       Assessment/Plan DM - SSI, CBD checks HTN - will need to hold  home meds due to NGT placement, will adjust to IV meds Urinary retention -leave Foley today.  Will try to DC tomorrow post op  POD9,1, s/popen rih repair for incarcerated RIH- Blackman, dx lap with LOA for SBO FB 12/15 -PICC/TNA -cont NGT and await resolution of ileus -mobilize TID -pulm toilet/IS -multi-modal pain control -PT eval  FEN - NPO/IVFs/NGT/TNA VTE - lovenox ID - none currently needed Dispo - await ileus resolution   LOS: 9 days    Letha Cape , [facility] Surgery 02/02/2020, 10:06 AM Please see Amion for pager number during day hours 7:00am-4:30pm or 7:00am -11:30am on weekends

## 2020-02-02 NOTE — Progress Notes (Addendum)
Initial Nutrition Assessment  INTERVENTION:   Monitor magnesium, potassium, and phosphorus daily for at least 3 days, MD to replete as needed, as pt is at risk for refeeding syndrome.  -TPN management per Pharmacy -Will monitor for diet advancement  NUTRITION DIAGNOSIS:   Inadequate oral intake related to  (ileus) as evidenced by NPO status  GOAL:   Patient will meet greater than or equal to 90% of their needs  MONITOR:   Diet advancement,Labs,Weight trends,I & O's (TPN)  REASON FOR ASSESSMENT:   Consult New TPN/TNA  ASSESSMENT:   74 y.o. male with past medical history of hypertension, narcolepsy, arthritis that presented to the emergency department for abdominal pain. CT confirmed pt with incarcerated hernia.  12/7: admitted for incarcerated hernia, s/p repair of incarcerated right inguinal hernia 12/8: diet was advanced 12/10: diet changed to clears 12/12: developed N/V, NPO 12/13: NGT placed 12/15: s/p diagnostic lap, LOA  Per surgery note, patient to remain on bowel rest until resolution of ileus. Pt reports he last ate some bread and cheese the day he came into the hospital. Has not been able to tolerate diet advancements. No PO for 9 days now. TPN initiation today.  TPN will begin at 40 ml/hr today, providing 969 kcals and 48g protein.  Per records in care everywhere, pt has lost 22 lbs since 6/24 (10% wt loss x 5.5-6 months, significant for time frame).  I/Os: +834 ml since admit UOP:  675 ml x 24 hrs NGT: 950 ml x 24 hrs  Medications reviewed.  Labs reviewed: CBGs: 76-90 Mg/Phos WNL  NUTRITION - FOCUSED PHYSICAL EXAM:  Deferred, will attempt at follow-up  Diet Order:   Diet Order            Diet NPO time specified Except for: Ice Chips, Other (See Comments)  Diet effective now                 EDUCATION NEEDS:   No education needs have been identified at this time  Skin:  Skin Assessment: Reviewed RN Assessment  Last BM:   12/11  Height:   Ht Readings from Last 1 Encounters:  01/25/20 6\' 1"  (1.854 m)    Weight:   Wt Readings from Last 1 Encounters:  01/25/20 81.6 kg    BMI:  Body mass index is 23.73 kg/m.  Estimated Nutritional Needs:   Kcal:  2050-2250  Protein:  105-115g  Fluid:  2L/day  09-19-1978, MS, RD, LDN Inpatient Clinical Dietitian Contact information available via Amion

## 2020-02-02 NOTE — Progress Notes (Signed)
Peripherally Inserted Central Catheter Placement  The IV Nurse has discussed with the patient and/or persons authorized to consent for the patient, the purpose of this procedure and the potential benefits and risks involved with this procedure.  The benefits include less needle sticks, lab draws from the catheter, and the patient may be discharged home with the catheter. Risks include, but not limited to, infection, bleeding, blood clot (thrombus formation), and puncture of an artery; nerve damage and irregular heartbeat and possibility to perform a PICC exchange if needed/ordered by physician.  Alternatives to this procedure were also discussed.  Bard Power PICC patient education guide, fact sheet on infection prevention and patient information card has been provided to patient /or left at bedside.    PICC Placement Documentation  PICC Double Lumen 02/02/20 PICC Right Brachial 43 cm 0 cm (Active)  Indication for Insertion or Continuance of Line Administration of hyperosmolar/irritating solutions (i.e. TPN, Vancomycin, etc.) 02/02/20 1640  Exposed Catheter (cm) 0 cm 02/02/20 1640  Site Assessment Clean;Dry;Intact 02/02/20 1640  Lumen #1 Status Flushed 02/02/20 1640  Lumen #2 Status Flushed;Blood return noted 02/02/20 1640  Dressing Type Transparent 02/02/20 1640  Dressing Status Clean;Dry;Intact 02/02/20 1640  Antimicrobial disc in place? Yes 02/02/20 1640  Dressing Intervention New dressing 02/02/20 1640  Dressing Change Due 02/09/20 02/02/20 1640       Reginia Forts Albarece 02/02/2020, 4:41 PM

## 2020-02-02 NOTE — Progress Notes (Signed)
PT Cancellation Note  Patient Details Name: Patrick Lam MRN: [id number] DOB: 06-08-1945   Cancelled Treatment:    Reason Eval/Treat Not Completed: Patient declined, no reason specified (Patient recently returned to bed, declines to mobilize due to abdominal pain. Will follow up at later date/time as pt is able and schedule allows.)   Wynn Maudlin, DPT Acute Rehabilitation Services Office [phone] Pager [phone]

## 2020-02-02 NOTE — Care Management Important Message (Signed)
Important Message  Patient Details IM Letter given to the Patient. Name: Sharod Petsch MRN: [id number] Date of Birth: [birth year]   Medicare Important Message Given:  Yes     Caren Macadam 02/02/2020, 12:29 PM

## 2020-02-03 ENCOUNTER — Encounter (HOSPITAL_COMMUNITY): Payer: Self-pay | Admitting: Surgery

## 2020-02-03 ENCOUNTER — Inpatient Hospital Stay (HOSPITAL_COMMUNITY): Payer: Medicare Other

## 2020-02-03 DIAGNOSIS — K4030 Unilateral inguinal hernia, with obstruction, without gangrene, not specified as recurrent: Principal | ICD-10-CM

## 2020-02-03 LAB — TROPONIN I (HIGH SENSITIVITY)
Troponin I (High Sensitivity): 27 ng/L — ABNORMAL HIGH (ref <18)
Troponin I (High Sensitivity): 408 ng/L (ref <18)
Troponin I (High Sensitivity): 381 ng/L (ref <18)

## 2020-02-03 LAB — BASIC METABOLIC PANEL
Anion gap: 9 (ref 5–15)
Anion gap: 12 (ref 5–15)
BUN: 17 mg/dL (ref 8–23)
BUN: 22 mg/dL (ref 8–23)
CO2: 27 mmol/L (ref 22–32)
CO2: 22 mmol/L (ref 22–32)
Calcium: 8 mg/dL — ABNORMAL LOW (ref 8.9–10.3)
Calcium: 7.7 mg/dL — ABNORMAL LOW (ref 8.9–10.3)
Chloride: 100 mmol/L (ref 98–111)
Chloride: 104 mmol/L (ref 98–111)
Creatinine, Ser: 0.6 mg/dL — ABNORMAL LOW (ref 0.61–1.24)
Creatinine, Ser: 0.93 mg/dL (ref 0.61–1.24)
GFR, Estimated: >60 mL/min (ref >60)
GFR, Estimated: >60 mL/min (ref >60)
Glucose, Bld: 145 mg/dL — ABNORMAL HIGH (ref 70–99)
Glucose, Bld: 95 mg/dL (ref 70–99)
Potassium: 3.4 mmol/L — ABNORMAL LOW (ref 3.5–5.1)
Potassium: 3.2 mmol/L — ABNORMAL LOW (ref 3.5–5.1)
Sodium: 136 mmol/L (ref 135–145)
Sodium: 138 mmol/L (ref 135–145)

## 2020-02-03 LAB — CBC WITH DIFFERENTIAL/PLATELET
Abs Immature Granulocytes: 0.28 10*3/uL — ABNORMAL HIGH (ref 0.00–0.07)
Basophils Absolute: 0 10*3/uL (ref 0.0–0.1)
Basophils Relative: 0 %
Eosinophils Absolute: 0 10*3/uL (ref 0.0–0.5)
Eosinophils Relative: 0 %
HCT: 38.8 % — ABNORMAL LOW (ref 39.0–52.0)
Hemoglobin: 12.7 g/dL — ABNORMAL LOW (ref 13.0–17.0)
Immature Granulocytes: 2 %
Lymphocytes Relative: 4 %
Lymphs Abs: 0.4 10*3/uL — ABNORMAL LOW (ref 0.7–4.0)
MCH: 24.8 pg — ABNORMAL LOW (ref 26.0–34.0)
MCHC: 32.7 g/dL (ref 30.0–36.0)
MCV: 75.8 fL — ABNORMAL LOW (ref 80.0–100.0)
Monocytes Absolute: 0 10*3/uL — ABNORMAL LOW (ref 0.1–1.0)
Monocytes Relative: 0 %
Neutro Abs: 11.2 10*3/uL — ABNORMAL HIGH (ref 1.7–7.7)
Neutrophils Relative %: 94 %
Platelets: 334 10*3/uL (ref 150–400)
RBC: 5.12 MIL/uL (ref 4.22–5.81)
RDW: 14.9 % (ref 11.5–15.5)
WBC: 12 10*3/uL — ABNORMAL HIGH (ref 4.0–10.5)
nRBC: 0 % (ref 0.0–0.2)

## 2020-02-03 LAB — GLUCOSE, CAPILLARY
Glucose-Capillary: 142 mg/dL — ABNORMAL HIGH (ref 70–99)
Glucose-Capillary: 160 mg/dL — ABNORMAL HIGH (ref 70–99)
Glucose-Capillary: 97 mg/dL (ref 70–99)
Glucose-Capillary: 127 mg/dL — ABNORMAL HIGH (ref 70–99)
Glucose-Capillary: 154 mg/dL — ABNORMAL HIGH (ref 70–99)

## 2020-02-03 LAB — PROTIME-INR
INR: 1.6 — ABNORMAL HIGH (ref 0.8–1.2)
Prothrombin Time: 18.4 seconds — ABNORMAL HIGH (ref 11.4–15.2)

## 2020-02-03 LAB — LACTIC ACID, PLASMA
Lactic Acid, Venous: 4 mmol/L (ref 0.5–1.9)
Lactic Acid, Venous: 4.1 mmol/L (ref 0.5–1.9)
Lactic Acid, Venous: 1.9 mmol/L (ref 0.5–1.9)

## 2020-02-03 LAB — PROCALCITONIN: Procalcitonin: 3.11 ng/mL

## 2020-02-03 LAB — BLOOD GAS, ARTERIAL
Acid-base deficit: 0.3 mmol/L (ref 0.0–2.0)
Bicarbonate: 21.7 mmol/L (ref 20.0–28.0)
Drawn by: 12117121
FIO2: 48
O2 Content: 7 L/min
O2 Saturation: 89.9 %
Patient temperature: 101.5
pCO2 arterial: 28.7 mmHg — ABNORMAL LOW (ref 32.0–48.0)
pH, Arterial: 7.49 — ABNORMAL HIGH (ref 7.350–7.450)
pO2, Arterial: 55.2 mmHg — ABNORMAL LOW (ref 83.0–108.0)

## 2020-02-03 LAB — PHOSPHORUS: Phosphorus: 2.5 mg/dL (ref 2.5–4.6)

## 2020-02-03 LAB — MAGNESIUM: Magnesium: 2 mg/dL (ref 1.7–2.4)

## 2020-02-03 LAB — MRSA PCR SCREENING: MRSA by PCR: NEGATIVE

## 2020-02-03 LAB — CORTISOL: Cortisol, Plasma: 53.5 ug/dL

## 2020-02-03 MED ORDER — LACTATED RINGERS IV BOLUS (SEPSIS)
1000.0000 mL | Freq: Once | INTRAVENOUS | Status: AC
  Administered 2020-02-03: 13:00:00 1000 mL via INTRAVENOUS

## 2020-02-03 MED ORDER — SODIUM CHLORIDE 0.9 % IV SOLN
200.0000 mg | Freq: Once | INTRAVENOUS | Status: AC
  Administered 2020-02-03: 17:00:00 200 mg via INTRAVENOUS
  Filled 2020-02-03: qty 200

## 2020-02-03 MED ORDER — PIPERACILLIN-TAZOBACTAM 3.375 G IVPB: 3.3750 g | Freq: Three times a day (TID) | INTRAVENOUS | Status: DC

## 2020-02-03 MED ORDER — SODIUM CHLORIDE 0.9 % IV SOLN: 2.0000 g | Freq: Once | INTRAVENOUS | Status: DC

## 2020-02-03 MED ORDER — TRAVASOL 10 % IV SOLN
INTRAVENOUS | Status: DC
  Filled 2020-02-03: qty 780

## 2020-02-03 MED ORDER — IOHEXOL 350 MG/ML SOLN
100.0000 mL | Freq: Once | INTRAVENOUS | Status: AC | PRN
  Administered 2020-02-03: 100 mL via INTRAVENOUS

## 2020-02-03 MED ORDER — LACTATED RINGERS IV BOLUS (SEPSIS): 1000.0000 mL | Freq: Once | INTRAVENOUS | Status: DC

## 2020-02-03 MED ORDER — ANIDULAFUNGIN 100 MG IV SOLR
100.0000 mg | INTRAVENOUS | Status: AC
Start: 2020-02-04 — End: 2020-02-09
  Administered 2020-02-04 – 2020-02-09 (×6): 100 mg via INTRAVENOUS
  Filled 2020-02-03 (×7): qty 100

## 2020-02-03 MED ORDER — POTASSIUM PHOSPHATES 15 MMOLE/5ML IV SOLN
20.0000 mmol | Freq: Once | INTRAVENOUS | Status: AC
  Administered 2020-02-03: 21:00:00 20 mmol via INTRAVENOUS
  Filled 2020-02-03: qty 6.67

## 2020-02-03 MED ORDER — LACTATED RINGERS IV BOLUS (SEPSIS)
500.0000 mL | Freq: Once | INTRAVENOUS | Status: AC
  Administered 2020-02-03: 14:00:00 500 mL via INTRAVENOUS

## 2020-02-03 MED ORDER — SODIUM CHLORIDE 0.9 % IV BOLUS: 1000.0000 mL | Freq: Once | INTRAVENOUS | Status: DC

## 2020-02-03 MED ORDER — PHENYLEPHRINE HCL-NACL 10-0.9 MG/250ML-% IV SOLN
0.0000 ug/min | INTRAVENOUS | Status: DC
  Administered 2020-02-03: 13:00:00 100 ug/min via INTRAVENOUS
  Administered 2020-02-03: 14:00:00 90 ug/min via INTRAVENOUS
  Administered 2020-02-03: 20:00:00 60 ug/min via INTRAVENOUS
  Administered 2020-02-03: 17:00:00 90 ug/min via INTRAVENOUS
  Administered 2020-02-03 – 2020-02-04 (×2): 70 ug/min via INTRAVENOUS
  Administered 2020-02-04: 04:00:00 50 ug/min via INTRAVENOUS
  Filled 2020-02-03 (×5): qty 250
  Filled 2020-02-03: qty 500

## 2020-02-03 MED ORDER — SODIUM CHLORIDE 0.9 % IV SOLN
1.0000 g | Freq: Three times a day (TID) | INTRAVENOUS | Status: AC
  Administered 2020-02-03 – 2020-02-09 (×20): 1 g via INTRAVENOUS
  Filled 2020-02-03 (×23): qty 1

## 2020-02-03 MED ORDER — SODIUM CHLORIDE 0.9 % IV SOLN: INTRAVENOUS | Status: AC

## 2020-02-03 MED ORDER — TRAVASOL 10 % IV SOLN
INTRAVENOUS | Status: AC
  Filled 2020-02-03: qty 780

## 2020-02-03 NOTE — Progress Notes (Signed)
PHARMACY - TOTAL PARENTERAL NUTRITION CONSULT NOTE   Indication: Bowel obstruction  Patient Measurements: Height: 6\' 1"  (185.4 cm) Weight: 81.6 kg (179 lb 14.3 oz) IBW/kg (Calculated) : 79.9 TPN AdjBW (KG): 81.6 Body mass index is 23.73 kg/m. Usual Weight:   Assessment: Patient is a 74 y.o M presented to the ED on 12/7 with abdominal pain.  Abd CT showed obstruction secondary to right inguinal hernia.  He underwent hernia repair on 12/7.  He subsequently developed n/v s/p procedure, placed on NPO, and NG placed on 12/13. Pharmacy consulted on 12/15 to dose TPN.  Glucose / Insulin: sSSI - cbgs < 150  Electrolytes: K 3.4 low, CorrCa 9.2, WNL; Phos is WNL but dropped from 3.7 to 2.5,  other lytes wnl Renal: scr <1 LFTs / TGs: LFTs wnl, 12/16 triglycerides 74 Prealbumin / albumin: prealbumin 7.5; Albumin 2.4 Intake / Output; MIVF: I/O -1730; NG output 390 ml GI Imaging: - 12/7 abd 14/7 bowel containing right inguinal hernia causing some degree of obstruction - 12/12 abd CT:  Small bowel diffusely dilated and fluid-filled with transition point in the right lower quadrant. Differential considerations include postoperative ileus or obstruction, the presence of a transition point fevers obstruction. Surgeries / Procedures:  -12/7: repair of inguinal incarcerated hernia with mesh - 12/13: NG placement - 12/15: plan for exp lap today  Central access: pending PICC placement TPN start date: 12/17   Nutritional Goals:  Dietician  Recommendations  On 12/16  Kcal:  2050-2250  Protein:  105-115g  Fluid:  2L/day  TPN at target rate of 90 ml/hr  - 2181 kcal - 108 grams of protein  - 2160 mL of fluid per day   Current Nutrition:  - NPO   - Potassium phosphate 20 mmol IV x1   Plan:   Increase TPN to 65 mL/hr at 1800  Electrolytes in TPN: 62mEq/L of Na, 55 mEq/L of K, 58mEq/L of Ca, 26mEq/L of Mg, and 20 mmol/L of Phos. Cl:Ac ratio 1:1  Add standard MVI and trace elements to  TPN  Sensitive q6h SSI and adjust as needed   Reduce MIVF to KVO mL/hr at 1800  BMP, magnesium, phosphate with AM labs   Monitor TPN labs on Mon/Thurs    4m, PharmD, BCPS 02/03/2020 10:05 AM

## 2020-02-03 NOTE — Progress Notes (Signed)
While working with PT patient became dizzy with tremors, stating he is so cold. Vitals taken. See chart. Patient put on 5/L [place]. EKG done showing a-flutter. CBG 160. Patient AO. Face is flushed and bottoms sides of hands are red. Rapid called. NS bolus started. Transferred to ICU.

## 2020-02-03 NOTE — Progress Notes (Signed)
Daughter made aware that patient has transferred to 1233. All questions answered.

## 2020-02-03 NOTE — Progress Notes (Signed)
Elink monitoring for sepsis. 

## 2020-02-03 NOTE — Progress Notes (Signed)
2 Days Post-Op  Subjective: Eyes burning but better now that he has a wet washcloth.  No flatus yet.  Ate 4-5 cups of ice chips yesterday.  NGT output around 2L.  Voiding well with foley out  ROS: See above, otherwise other systems negative  Objective: Vital signs in last 24 hours: Temp:  [97.7 F (36.5 C)-98.9 F (37.2 C)] 98.5 F (36.9 C) (12/17 0500) Pulse Rate:  [97-107] 102 (12/17 0500) Resp:  [14-18] 17 (12/17 0500) BP: (127-157)/(60-99) 151/74 (12/17 0500) SpO2:  [90 %-93 %] 90 % (12/17 0500) Last BM Date:  (Stated it was sometime last week)  Intake/Output from previous day: 12/16 0701 - 12/17 0700 In: 1769.5 [P.O.:480; I.V.:899.4; IV Piggyback:390.1] Out: 3500 [Urine:1450; Emesis/NG output:2050] Intake/Output this shift: No intake/output data recorded.  PE: Abd: soft, but seems a bit more distended today in upper abdomen than yesterday.  With some BS, NGT with bilious output, but suspect watered down some.  Incisions c/d/i, ecchymosis stable in right inguinal region.  Lab Results:  Recent Labs    02/01/20 0603 02/02/20 0515  WBC 9.6 13.6*  HGB 13.0 12.9*  HCT 39.8 40.2  PLT 338 358   BMET Recent Labs    02/02/20 0512 02/03/20 0425  NA 138 136  K 4.0 3.4*  CL 103 100  CO2 25 27  GLUCOSE 98 145*  BUN 22 17  CREATININE 0.69 0.60*  CALCIUM 8.2* 8.0*   PT/INR No results for input(s): LABPROT, INR in the last 72 hours. CMP     Component Value Date/Time   NA 136 02/03/2020 0425   K 3.4 (L) 02/03/2020 0425   CL 100 02/03/2020 0425   CO2 27 02/03/2020 0425   GLUCOSE 145 (H) 02/03/2020 0425   BUN 17 02/03/2020 0425   CREATININE 0.60 (L) 02/03/2020 0425   CALCIUM 8.0 (L) 02/03/2020 0425   PROT 5.5 (L) 02/02/2020 0512   ALBUMIN 2.4 (L) 02/02/2020 0512   AST 23 02/02/2020 0512   ALT 23 02/02/2020 0512   ALKPHOS 49 02/02/2020 0512   BILITOT 0.7 02/02/2020 0512   GFRNONAA >60 02/03/2020 0425   GFRAA >60 02/04/2018 0514   Lipase  No results  found for: LIPASE     Studies/Results: [place] EKG SITE RITE  Result Date: 02/01/2020 If Site Rite image not attached, placement could not be confirmed due to current cardiac rhythm.   Anti-infectives: Anti-infectives (From admission, onward)   Start     Dose/Rate Route Frequency Ordered Stop   02/01/20 1345  ceFAZolin (ANCEF) 2-4 GM/100ML-% IVPB       Note to Pharmacy: Sharyn Creamer   : cabinet override      02/01/20 1345 02/02/20 0159   01/25/20 0500  ceFAZolin (ANCEF) IVPB 2g/100 mL premix        2 g 200 mL/hr over 30 Minutes Intravenous Every 8 hours 01/25/20 0004 01/25/20 0537   01/24/20 2103  ceFAZolin (ANCEF) 2-4 GM/100ML-% IVPB       Note to Pharmacy: Johnette Abraham   : cabinet override      01/24/20 2103 01/24/20 2134   01/24/20 2015  ceFAZolin (ANCEF) IVPB 2g/100 mL premix        2 g 200 mL/hr over 30 Minutes Intravenous  Once 01/24/20 2014 01/24/20 2134       Assessment/Plan DM - SSI, CBD checks HTN - will need to hold home meds due to NGT placement, will adjust to IV meds Urinary retention - resolved  POD10,2,  s/popen rih repair for incarcerated RIH- Blackman, dx lap with LOA for SBO FB 12/15 -PICC/TNA -cont NGT and await resolution of ileus -mobilize TID -pulm toilet/IS -multi-modal pain control -PT eval pending  FEN - NPO/IVFs/NGT/TNA VTE - lovenox ID - none currently needed Dispo - await ileus resolution   LOS: 10 days    Letha Cape , [facility] 02/03/2020, 8:56 AM Please see Amion for pager number during day hours 7:00am-4:30pm or 7:00am -11:30am on weekends

## 2020-02-03 NOTE — Procedures (Signed)
Arterial Catheter Insertion Procedure Note  Siraj Dermody  [id number]  03-15-1945  Date:02/03/20  Time:1:33 PM    Provider Performing: Trey Sailors    Procedure: Insertion of Arterial Line (40981) with US guidance (19147)   Indication(s) Blood pressure monitoring and/or need for frequent ABGs  Consent Risks of the procedure as well as the alternatives and risks of each were explained to the patient and/or caregiver.  Consent for the procedure was obtained and is signed in the bedside chart  Anesthesia None   Time Out Verified patient identification, verified procedure, site/side was marked, verified correct patient position, special equipment/implants available, medications/allergies/relevant history reviewed, required imaging and test results available.   Sterile Technique Maximal sterile technique including full sterile barrier drape, hand hygiene, sterile gown, sterile gloves, mask, hair covering, sterile ultrasound probe cover (if used).   Procedure Description Area of catheter insertion was cleaned with chlorhexidine and draped in sterile fashion. Without real-time ultrasound guidance an arterial catheter was placed into the left radial artery.  Appropriate arterial tracings confirmed on monitor.     Complications/Tolerance None; patient tolerated the procedure well.   EBL Minimal   Specimen(s) None

## 2020-02-03 NOTE — Sepsis Progress Note (Signed)
Sepsis monitoring complete. Now following as ICU patient

## 2020-02-03 NOTE — Progress Notes (Signed)
eLink Physician-Brief Progress Note Patient Name: Patrick Lam DOB: [birth year] MRN: [id number]   Date of Service  02/03/2020  HPI/Events of Note  Troponin up from baseline of 27 to 408, patient with septic shock but no complaints of chest pain, suggesting demand as etiology of Troponin bump.  eICU Interventions  Cycle Troponin until it peaks and declines, Echo in a.m. to check LV function and r/o RWMA.        Patrick Lam 02/03/2020, 8:44 PM

## 2020-02-03 NOTE — Consult Note (Signed)
NAME:  Patrick Lam, MRN:  [id number], DOB:  04/28/1945, LOS: 10 ADMISSION DATE:  01/24/2020, CONSULTATION DATE: 12/17 REFERRING MD: CCS, Barnetta ChapelKelly Osborne, PA-C CHIEF COMPLAINT:  Sepsis    Brief History:  74 y/o M admitted 12/7 with acute right inguinal hernia s/p open repair.  Post operative course complicated by bowel obstruction requiring repeat OR visit 12/15 for lysis of dense adhesions.  Developed concern for sepsis 12/17 and transferred to ICU.   History of Present Illness:  74 y/o M who [facility] with acute onset painful bulge in his right groin.   Prior to admit, he was in his usual state of health when he developed acute onset pain.  Initial surgical evaluation noted a large hernia that was not reducible prompting urgent OR evaluation in the hopes of avoiding bowel ischemia. He was taken to the OR on 12/7 for open inguinal hernia repair.  Operative findings notable for an incarcerated direct right inguinal hernia with approximately 6 inches of near ischemic bowel which improved with opening of the hernia. Additional findings of involvement of the right testicular cord being pulled up and edematous.  ProGrip mesh was placed into the inguinal floor.  He was returned post-operatively to the medical floor.  He developed belching and nausea on 12/10.  KUB evaluation was concerning for possible ileus. On 12/12 he continued to have nausea and emesis.  CT of the abdomen on 12/12 demonstrated diffusely dilated small bowel, fluid-filled with transition point the the RLQ and progressive consolidation of both lung bases.  On 12/14 he underwent small bowel protocol. Post protocol there were ongoing concerns for obstruction and he returned to the OR on 12/15 for laparoscopic laparotomy which found dense adhesions in the pelvis and RLQ causing obstruction.  On 12/17 the patient developed acute onset shortness of breath, tachycardia and hypotension. He was transferred to ICU per the Rapid Response  Team with concerns for septic shock.  Labs from 12/17 - K 3.4, BUN 17 / Sr Cr 0.60.  CBC pending.  He did have a mild left shift on his CBC on 12/16.  He was pan cultured, resuscitated with 6430ml/kg and treated with empiric abx.  CTA chest, CT ABD/Pelvis pending.   PCCM consulted for evaluation.   Past Medical History:  Pre-Diabetes HTN Narcolepsy, Cataplexy  Arthritis   Significant Hospital Events:  12/07 Admit with right inguinal bulge, open right inguinal hernia repair  12/15 Return to OR for lysis of adhesions, SBO  12/17 Tx to ICU, PCCM consulted for concern of sepsis  Consults:    Procedures:    Significant Diagnostic Tests:  CT ABD/Pelvis 12/7 >> small bowel containing right inguinal hernia causing some degree of obstruction.   CT ABD/Pelvis 12/12 >> s/p right inguinal hernia repari, small bowel diffusely dilated and fluid filled with transition point in the right lower quadrant, no residual bowel involvement of the right inguinal hernia, progressive consolidation of both lung bases, new areas of nodular GGO in the lingula, RML, mild gallbladder distention, colonic diverticulosis without acute diverticulitis  Micro Data:  COVID 12/7 >> negative  Sputum 12/17 >>  BCx2 12/17 >>  UA 12/17 >>   Antimicrobials:  Meropenem 12/17 >>  Anidulafungin 12/17 >>   Interim History / Subjective:  RN reports pt receiving fluid bolus currently, labs pending.  EKG completed. Pt on neosynephrine 70mcg.   Pt reports he feels short of breath but denies chest pain / pressure  Temp at bedside 101.5 axillary  Objective   Blood pressure (!) 83/45, pulse (!) 143, temperature 98.4 F (36.9 C), temperature source Oral, resp. rate 16, height 6\' 1"  (1.854 m), weight 81.6 kg, SpO2 90 %.        Intake/Output Summary (Last 24 hours) at 02/03/2020 1204 Last data filed at 02/03/2020 1000 Gross per 24 hour  Intake 1649.5 ml  Output 3200 ml  Net -1550.5 ml   Filed Weights   01/25/20 0000   Weight: 81.6 kg    Examination: General: ill appearing adult male lying in bed, appears uncomfortable   HEENT: MM pink / very dry, anicteric, wearing glasses, NGT in place  Neuro: AAOx4, speech clear, MAE / generalized weakness  CV: s1s2 RRR, ST 140's on monitor, no m/r/g PULM: tachypnea with mild abdominal accessory muscle use, diminished bases bilaterally / clear anterior GI: soft, bsx4 active, NGT in place with green drainage, right inguinal incision c/d/i, lap sites c/d/i Extremities: warm/dry, no edema  Skin: no rashes or lesions, surgical sites as above   Resolved Hospital Problem list     Assessment & Plan:   Shock, suspected Septic. Rule Out Obstructive.  Concern for possible abdominal sepsis, rule out pulmonary infection with possible aspiration noted on prior CT imaging, PE. -now 22ml/kg IVF resuscitation -empiric abx & antifungals for abdominal coverage -assess pan cultures -repeat abdominal imaging, CTA chest to rule out PE.  -neosynephrine for MAP >65   Open Right Inguinal Hernia Repair  Post-operative SBO  Initial surgical intervention on 12/7 with open right inguinal hernia repair. Returned to the OR on 12/15 for lysis of adhesions in the setting of SBO. At risk for bowel injury.   -post operative care per CCS  -NPO x meds per CCS  -follow abdominal exam  -await CT abd/pelvis   Acute Hypoxemic Respiratory Failure  Suspect in setting of bibasilar atelectasis / splinting post abd surgery, possible aspiration PNA with prior vomiting -wean O2 for sats >90%  -pulmonary hygiene - IS, mobilize   Hypokalemia  Hypophosphatemia  -monitor, replace as indicated   Hyperglycemia / DM -SSI, sensitive scale  -hold metformin    At Risk Malnutrition  -TPN per pharmacy   Hx Narcolepsy  -continue PTA medications   Hx HTN -hold home cardizem, losartan, diuretics  Best practice (evaluated daily)  Diet: NPO  Pain/Anxiety/Delirium protocol (if indicated): pain  control per CCS  VAP protocol (if indicated): n/a  DVT prophylaxis: lovenox  GI prophylaxis: n/a  Glucose control: SSI Mobility: as tolerated  Disposition: ICU, per CCS   Goals of Care:  Last date of multidisciplinary goals of care discussion: pending Family and staff present:  Summary of discussion:  Follow up goals of care discussion due:  Code Status: Full Code   Labs   CBC: Recent Labs  Lab 01/28/20 0612 01/30/20 0502 02/01/20 0603 02/02/20 0515  WBC 7.7 5.9 9.6 13.6*  NEUTROABS  --   --   --  10.7*  HGB 14.9 13.1 13.0 12.9*  HCT 45.7 40.5 39.8 40.2  MCV 76.3* 76.0* 75.4* 76.1*  PLT 282 264 338 358    Basic Metabolic Panel: Recent Labs  Lab 01/30/20 0502 01/31/20 0512 02/01/20 0603 02/01/20 1635 02/02/20 0512 02/03/20 0425  NA 131* 135 138  --  138 136  K 3.7 3.8 3.5  --  4.0 3.4*  CL 89* 96* 102  --  103 100  CO2 29 25 24   --  25 27  GLUCOSE 120* 99 89  --  98 145*  BUN 27* 23 23  --  22 17  CREATININE 0.75 0.66 0.75  --  0.69 0.60*  CALCIUM 8.3* 8.2* 8.2*  --  8.2* 8.0*  MG  --   --   --  2.2 2.0 2.0  PHOS  --   --   --  4.7* 3.7 2.5   GFR: Estimated Creatinine Clearance: 88.8 mL/min (A) (by C-G formula based on SCr of 0.6 mg/dL (L)). Recent Labs  Lab 01/28/20 0612 01/30/20 0502 02/01/20 0603 02/02/20 0515  WBC 7.7 5.9 9.6 13.6*    Liver Function Tests: Recent Labs  Lab 02/02/20 0512  AST 23  ALT 23  ALKPHOS 49  BILITOT 0.7  PROT 5.5*  ALBUMIN 2.4*   No results for input(s): LIPASE, AMYLASE in the last 168 hours. No results for input(s): AMMONIA in the last 168 hours.  ABG    Component Value Date/Time   TCO2 27 01/24/2020 1935     Coagulation Profile: No results for input(s): INR, PROTIME in the last 168 hours.  Cardiac Enzymes: No results for input(s): CKTOTAL, CKMB, CKMBINDEX, TROPONINI in the last 168 hours.  HbA1C: Hgb A1c MFr Bld  Date/Time Value Ref Range Status  01/30/2020 05:02 AM 5.8 (H) 4.8 - 5.6 % Final     Comment:    (NOTE) Pre diabetes:          5.7%-6.4%  Diabetes:              >6.4%  Glycemic control for   <7.0% adults with diabetes   01/26/2018 02:12 PM 6.0 (H) 4.8 - 5.6 % Final    Comment:    (NOTE) Pre diabetes:          5.7%-6.4% Diabetes:              >6.4% Glycemic control for   <7.0% adults with diabetes     CBG: Recent Labs  Lab 02/02/20 1212 02/02/20 1746 02/02/20 2331 02/03/20 0504 02/03/20 1137  GLUCAP 90 102* 134* 142* 160*    Review of Systems: Positives in [place]   Gen: Denies fever, chills, weight change, fatigue, night sweats HEENT: Denies blurred vision, double vision, hearing loss, tinnitus, sinus congestion, rhinorrhea, sore throat, neck stiffness, dysphagia PULM: Denies shortness of breath, cough, sputum production, hemoptysis, wheezing CV: Denies chest pain, edema, orthopnea, paroxysmal nocturnal dyspnea, palpitations GI: Denies abdominal pain, nausea, vomiting, diarrhea, hematochezia, melena, constipation, change in bowel habits GU: Denies dysuria, hematuria, polyuria, oliguria, urethral discharge Endocrine: Denies hot or cold intolerance, polyuria, polyphagia or appetite change Derm: Denies rash, dry skin, scaling or peeling skin change Heme: Denies easy bruising, bleeding, bleeding gums Neuro: Denies headache, numbness, weakness, slurred speech, loss of memory or consciousness   Past Medical History:  He,  has a past medical history of Arthritis, Diabetes mellitus without complication (HCC), Hypertension, Narcolepsy and cataplexy, and Pre-diabetes.   Surgical History:   Past Surgical History:  Procedure Laterality Date  . COLON RESECTION N/A 02/01/2020   Procedure: DIAGNOSTIC  LAPAROSCOPY;  Surgeon: Almond Lint, MD;  Location: [facility];  Service: General;  Laterality: N/A;  . INGUINAL HERNIA REPAIR Right 01/24/2020   Procedure: REPAIR INGUINAL INCARCERATED HERNIA WITH MESH;  Surgeon: Abigail Miyamoto, MD;  Location: [facility];  Service:  General;  Laterality: Right;  . LAPAROSCOPIC LYSIS OF ADHESIONS N/A 02/01/2020   Procedure: LAPAROSCOPIC LYSIS OF ADHESIONS;  Surgeon: Almond Lint, MD;  Location: [facility];  Service: General;  Laterality: N/A;  . MOUTH SURGERY    .  TOTAL KNEE ARTHROPLASTY Left 02/02/2018   Procedure: TOTAL KNEE ARTHROPLASTY;  Surgeon: Durene Romans, MD;  Location: [facility];  Service: Orthopedics;  Laterality: Left;      Social History:   reports that he has been smoking cigars. He has been smoking about 0.00 packs per day for the past 15.00 years. He has never used smokeless tobacco. He reports that he does not drink alcohol and does not use drugs.   Family History:  His family history is not on file.   Allergies No Known Allergies   Home Medications  Prior to Admission medications   Medication Sig Start Date End Date Taking? Authorizing Provider  acetaminophen (TYLENOL) 500 MG tablet Take 1,000 mg by mouth every 6 (six) hours as needed for mild pain, moderate pain, fever or headache.   Yes [provider]  clomiPRAMINE (ANAFRANIL) 25 MG capsule Take 25 mg by mouth 3 (three) times daily.    Yes [provider]  diltiazem (DILACOR XR) 240 MG 24 hr capsule Take 240 mg by mouth daily.   Yes [provider]  GARLIC OIL PO Take 1 tablet by mouth daily.   Yes [provider]  hydrochlorothiazide (HYDRODIURIL) 25 MG tablet Take 25 mg by mouth daily.   Yes [provider]  losartan (COZAAR) 25 MG tablet Take 25 mg by mouth daily.   Yes [provider]  meloxicam (MOBIC) 7.5 MG tablet Take 7.5 mg by mouth daily as needed for pain.   Yes [provider]  metFORMIN (GLUCOPHAGE) 500 MG tablet Take 500 mg by mouth daily with breakfast.   Yes [provider]  methylphenidate (RITALIN) 20 MG tablet Take 20 mg by mouth daily as needed (narcolepsy).   Yes [provider]  Multiple Vitamin (MULTIVITAMIN WITH MINERALS) TABS tablet Take 1  tablet by mouth daily.   Yes [provider]  omeprazole (PRILOSEC) 20 MG capsule Take 20 mg by mouth daily.   Yes [provider]  rosuvastatin (CRESTOR) 10 MG tablet Take 10 mg by mouth daily.   Yes [provider]  Sodium Oxybate (XYREM) 500 MG/ML SOLN Take 4.5 g by mouth 2 (two) times daily. During the night   Yes [provider]  vitamin B-12 (CYANOCOBALAMIN) 500 MCG tablet Take 500 mcg by mouth daily.   Yes [provider]     Critical care time: 35 minutes     Canary Brim, MSN, NP-C, AGACNP-BC [facility] 02/03/2020, 12:04 PM   Please see [email or web address] for pager details.

## 2020-02-03 NOTE — Evaluation (Addendum)
Physical Therapy Evaluation Patient Details Name: Patrick Lam MRN: [id number] DOB: 20-May-1945 Today's Date: 02/03/2020   History of Present Illness  Patient is 74 y.o. male who present to [facility] on 01/24/20 with c/o abdominal pain around his bellybutton that started to radiate to his right groin and testicles. Imaging in ED revealed large incarcerated Rt inguinal hernia. Patient now s/p open Rt inguinal hernia repair for incarcerated RIH on 01/24/20 and dx lap with LOA for SBO on 02/01/20. PMH significant for HTN, DM, OA, narcolepsy, and Lt TKA in 2019.    Clinical Impression  Patrick Lam is 74 y.o. male admitted with above HPI and diagnosis. Patient is currently limited by functional impairments below (see PT problem list). Per chart review patient lives alone and is independent at baseline. Patient received in bed and c/o being cold this morning and agreeable to minimal mobilization. Min guard provided to sit up to EOB and pt c/o dizziness. VS assessed and pt hypotension/tachycardic. RN notified and pt returned to supine. Patient will benefit from continued skilled PT interventions to address impairments and progress independence with mobility, recommending SNF follow up at this time. Acute PT will follow and progress as able.     Follow Up Recommendations SNF    Equipment Recommendations  None recommended by PT    Recommendations for Other Services OT consult     Precautions / Restrictions Precautions Precautions: Fall Restrictions Weight Bearing Restrictions: No      Mobility  Bed Mobility Overal bed mobility: Needs Assistance Bed Mobility: Supine to Sit;Sit to Supine     Supine to sit: Min guard;HOB elevated Sit to supine: Mod assist   General bed mobility comments: pt able to use bed rail to sit up to EOB and no assist needed to bring LE's off EOB. pt reports not feeling well while mobilizing to EOB. c/o dizziness and vitals assessed. Pt BP 55/44 and HR in 110's then  increased to 140's. Pt returned to supine immediately and RN notified and in room. pt place in trendelenburg and BP improved to 83/45 with HR 145 bpm and SpO2 86% on RA. RN and NT in room to complete EKG and calling Rapid team.    Transfers                    Ambulation/Gait                Stairs            Wheelchair Mobility    Modified Rankin (Stroke Patients Only)       Balance                                             Pertinent Vitals/Pain Pain Assessment: Faces Faces Pain Scale: Hurts even more Pain Location: stomach    Home Living Family/patient expects to be discharged to:: Private residence Living Arrangements: Alone               Additional Comments: unable to obtain more home environment as pt became dizzy and returned to supine, vitals assessed and RN notified.    Prior Function Level of Independence: Independent               Hand Dominance        Extremity/Trunk Assessment   Upper Extremity Assessment Upper Extremity Assessment: Defer to OT evaluation  Lower Extremity Assessment Lower Extremity Assessment: Generalized weakness       Communication   Communication: No difficulties  Cognition Arousal/Alertness: Awake/alert Behavior During Therapy: WFL for tasks assessed/performed Overall Cognitive Status: No family/caregiver present to determine baseline cognitive functioning                                 General Comments: pt reports being cold and agreeabl to minimal mobility.      General Comments      Exercises     Assessment/Plan    PT Assessment Patient needs continued PT services  PT Problem List Decreased strength;Decreased activity tolerance;Decreased balance;Decreased mobility;Decreased knowledge of use of DME       PT Treatment Interventions DME instruction;Gait training;Functional mobility training;Therapeutic activities;Therapeutic exercise;Balance  training;Patient/family education    PT Goals (Current goals can be found in the Care Plan section)  Acute Rehab PT Goals Patient Stated Goal: none stated this session PT Goal Formulation: With patient Time For Goal Achievement: 02/17/20 Potential to Achieve Goals: Fair    Frequency Min 3X/week   Barriers to discharge        Co-evaluation               AM-PAC PT "6 Clicks" Mobility  Outcome Measure Help needed turning from your back to your side while in a flat bed without using bedrails?: A Little Help needed moving from lying on your back to sitting on the side of a flat bed without using bedrails?: A Little Help needed moving to and from a bed to a chair (including a wheelchair)?: A Lot Help needed standing up from a chair using your arms (e.g., wheelchair or bedside chair)?: A Lot Help needed to walk in hospital room?: A Lot Help needed climbing 3-5 steps with a railing? : A Lot 6 Click Score: 14    End of Session   Activity Tolerance: Treatment limited secondary to medical complications (Comment) (hypotensive tachycardic, RN notified, Rapid called) Patient left: in bed;with nursing/sitter in room Nurse Communication: Mobility status PT Visit Diagnosis: Other abnormalities of gait and mobility (R26.89);Muscle weakness (generalized) (M62.81);Difficulty in walking, not elsewhere classified (R26.2)    Time: 8527-7824 PT Time Calculation (min) (ACUTE ONLY): 20 min   Charges:   PT Evaluation $PT Eval Moderate Complexity: 1 Mod          Wynn Maudlin, DPT Acute Rehabilitation Services Office [phone] Pager [phone]    Anitra Lauth 02/03/2020, 11:55 AM

## 2020-02-03 NOTE — Significant Event (Signed)
Rapid Response Event Note   Reason for Call : Hypotension/Hypoxia  Notified by bedside RN that patient was hypotensive and having desaturations on 4LNC into the low to mid 80s. Upon arrival patient's blood pressure was hypotensive. Systolic BP was ranging from 70s-80s. MD Byerly at bedside and ordered 1 liter bolus, and CBC with diff/Troponin/PT-INR/and BMET. Orders placed and followed through. Transferring immediately to ICU to administer neosynephrine gtt.   Initial Focused Assessment:  Neuro: Responds to voice but drowsy at times, oriented x 4, follows commands but weak in all extremities, equal in regards to weakness throughout  Cardiac: ST HR 140s, BP hypotensive-see vital signs Pulmonary: Lung sounds clear and diminished in upper and lower bases. O2 Sats in low to mid 80s on 4L [place] increased to 6L.   Interventions:  Initiated fluid bolus EKG obtained, tele monitor attached Transferred immediately to ICU    Plan of Care: Trx to ICU    Event Summary:   MD Notified: MD Donell Beers at bedside Call Time: 1153 Arrival Time: 1155 End Time: 1300   Sumayyah Custodio C, RN

## 2020-02-03 NOTE — Progress Notes (Signed)
Pharmacy Antibiotic Note  Patrick Lam is a 74 y.o. male admitted on 01/24/2020 with incarcerated hernia s/p repair. Rapid response called 12/17 and patient transferred to ICU on pressors. Pharmacy has been consulted for meropenem dosing for sepsis.  Plan: Meropenem 1 g iv q 8 hours  Will f/u renal function, culture results, and clinical course  Height: 6\' 1"  (185.4 cm) Weight: 81.6 kg (179 lb 14.3 oz) IBW/kg (Calculated) : 79.9  Temp (24hrs), Avg:98.3 F (36.8 C), Min:97.7 F (36.5 C), Max:98.9 F (37.2 C)  Recent Labs  Lab 01/28/20 0612 01/30/20 0502 01/31/20 0512 02/01/20 0603 02/02/20 0512 02/02/20 0515 02/03/20 0425 02/03/20 1245  WBC 7.7 5.9  --  9.6  --  13.6*  --  PENDING  CREATININE 0.87 0.75 0.66 0.75 0.69  --  0.60*  --     Estimated Creatinine Clearance: 88.8 mL/min (A) (by C-G formula based on SCr of 0.6 mg/dL (L)).    No Known Allergies  Antimicrobials this admission: 12/15 cefazolin periop 12/17 meropenem >>   Dose adjustments this admission:  Microbiology results: 12/7 resp panel: COVID: neg, Flu: neg BCx: Sputum:  12/17 MRSA PCR: sent  Thank you for allowing pharmacy to be a part of this patient's care.  1/18 D 02/03/2020 1:05 PM

## 2020-02-03 NOTE — Progress Notes (Signed)
Called to bedside urgently for hypotension and tachycardia.  Pt HR 140s and BP 70s/40s.  Rapid response team called. Fluid bolus started.  Sats noted to be in the high 80s.  O2 turned up as well.  Bed in ICU was immediately available, so patient transferred there for further workup.  Neo started.  Pt felt faint and BP still low, so neo turned up to 100 mcg/min.  At that point, his BP came up to 150-170s systolic and his breathing slowed down a little.  O2 sats came up to 90s, but he remained tachycardic.    Labs being sent.  CXR being performed now.  CTA chest and CT abd/pelvis ordered.  He is POD 2 from long lysis of adhesions and is certainly at risk for bowel injury.    CCM consulted as well.

## 2020-02-04 ENCOUNTER — Inpatient Hospital Stay (HOSPITAL_COMMUNITY): Payer: Medicare Other

## 2020-02-04 DIAGNOSIS — J9600 Acute respiratory failure, unspecified whether with hypoxia or hypercapnia: Secondary | ICD-10-CM

## 2020-02-04 DIAGNOSIS — R578 Other shock: Secondary | ICD-10-CM

## 2020-02-04 DIAGNOSIS — R579 Shock, unspecified: Secondary | ICD-10-CM

## 2020-02-04 LAB — GLUCOSE, CAPILLARY
Glucose-Capillary: 119 mg/dL — ABNORMAL HIGH (ref 70–99)
Glucose-Capillary: 155 mg/dL — ABNORMAL HIGH (ref 70–99)
Glucose-Capillary: 146 mg/dL — ABNORMAL HIGH (ref 70–99)

## 2020-02-04 LAB — BASIC METABOLIC PANEL
Anion gap: 9 (ref 5–15)
BUN: 19 mg/dL (ref 8–23)
CO2: 24 mmol/L (ref 22–32)
Calcium: 7.6 mg/dL — ABNORMAL LOW (ref 8.9–10.3)
Chloride: 106 mmol/L (ref 98–111)
Creatinine, Ser: 0.66 mg/dL (ref 0.61–1.24)
GFR, Estimated: >60 mL/min (ref >60)
Glucose, Bld: 137 mg/dL — ABNORMAL HIGH (ref 70–99)
Potassium: 3.8 mmol/L (ref 3.5–5.1)
Sodium: 139 mmol/L (ref 135–145)

## 2020-02-04 LAB — PHOSPHORUS: Phosphorus: 3.4 mg/dL (ref 2.5–4.6)

## 2020-02-04 LAB — ECHOCARDIOGRAM COMPLETE
Area-P 1/2: 2.95 cm2
Height: 73 in
Weight: 2878.33 oz

## 2020-02-04 LAB — PROCALCITONIN: Procalcitonin: 33.44 ng/mL

## 2020-02-04 LAB — MAGNESIUM: Magnesium: 1.7 mg/dL (ref 1.7–2.4)

## 2020-02-04 LAB — TROPONIN I (HIGH SENSITIVITY): Troponin I (High Sensitivity): 355 ng/L (ref <18)

## 2020-02-04 MED ORDER — MAGNESIUM SULFATE 2 GM/50ML IV SOLN
2.0000 g | Freq: Once | INTRAVENOUS | Status: AC
  Administered 2020-02-04: 09:00:00 2 g via INTRAVENOUS
  Filled 2020-02-04: qty 50

## 2020-02-04 MED ORDER — HYDRALAZINE HCL 20 MG/ML IJ SOLN: 10.0000 mg | Freq: Four times a day (QID) | INTRAMUSCULAR | Status: DC | PRN

## 2020-02-04 MED ORDER — TRAVASOL 10 % IV SOLN
INTRAVENOUS | Status: AC
  Filled 2020-02-04 (×2): qty 1080

## 2020-02-04 MED ORDER — TRAVASOL 10 % IV SOLN
INTRAVENOUS | Status: DC
  Filled 2020-02-04: qty 1080

## 2020-02-04 MED ORDER — PERFLUTREN LIPID MICROSPHERE
1.0000 mL | INTRAVENOUS | Status: AC | PRN
  Administered 2020-02-04: 09:00:00 3 mL via INTRAVENOUS
  Filled 2020-02-04: qty 10

## 2020-02-04 NOTE — Progress Notes (Addendum)
Echocardiogram 2D Echocardiogram has been performed.  Warren Lacy Kenlie Seki 02/04/2020, 8:21 AM   Dr. Flora Lipps notified of stat order

## 2020-02-04 NOTE — Progress Notes (Signed)
3 Days Post-Op   Subjective/Chief Complaint: feels better   HOTN resolved  Getting ECHO this am  Abdomen feels better     Objective: Vital signs in last 24 hours: Temp:  [97.4 F (36.3 C)-100.58 F (38.1 C)] 99.5 F (37.5 C) (12/18 0630) Pulse Rate:  [90-143] 91 (12/18 0630) Resp:  [16-30] 22 (12/18 0630) BP: (83-163)/(45-68) 117/58 (12/18 0630) SpO2:  [90 %-100 %] 100 % (12/18 0630) Arterial Line BP: (99-142)/(48-62) 99/54 (12/17 2200) Last BM Date: 01/29/20  Intake/Output from previous day: 12/17 0701 - 12/18 0700 In: 7407.8 [I.V.:5842.1; IV Piggyback:1565.7] Out: 3225 [Urine:1625; Emesis/NG output:1600] Intake/Output this shift: No intake/output data recorded.   Abd: soft,  less distended NGT bilious but lighter    Incisions c/d/i, ecchymosis stable in right inguinal region port sites CDI Lab Results:  Recent Labs    02/02/20 0515 02/03/20 1245  WBC 13.6* 12.0*  HGB 12.9* 12.7*  HCT 40.2 38.8*  PLT 358 334   BMET Recent Labs    02/03/20 1245 02/04/20 0520  NA 138 139  K 3.2* 3.8  CL 104 106  CO2 22 24  GLUCOSE 95 137*  BUN 22 19  CREATININE 0.93 0.66  CALCIUM 7.7* 7.6*   PT/INR Recent Labs    02/03/20 1245  LABPROT 18.4*  INR 1.6*   ABG Recent Labs    02/03/20 1302  PHART 7.490*  HCO3 21.7    Studies/Results: CT ANGIO CHEST PE W OR WO CONTRAST  Result Date: 02/03/2020 CLINICAL DATA:  PE suspected, high prob; Abdominal abscess/infection suspected Hypotension and tachycardia 2 days post lysis of adhesions. EXAM: CT ANGIOGRAPHY CHEST CT ABDOMEN AND PELVIS WITH CONTRAST TECHNIQUE: Multidetector CT imaging of the chest was performed using the standard protocol during bolus administration of intravenous contrast. Multiplanar CT image reconstructions and MIPs were obtained to evaluate the vascular anatomy. Multidetector CT imaging of the abdomen and pelvis was performed using the standard protocol during bolus administration of intravenous  contrast. CONTRAST:  OMNIPAQUE IOHEXOL 350 MG/ML SOLN COMPARISON:  Radiograph earlier today. Preoperative abdominal CT 01/29/2020 FINDINGS: CTA CHEST FINDINGS Cardiovascular: There are no filling defects within the pulmonary arteries to suggest pulmonary embolus. Breathing motion artifact limits detailed assessment. Aortic atherosclerosis without dissection. No aortic aneurysm. Right upper extremity central line tip in the lower SVC. The heart is normal in size. There are coronary artery calcifications. No pericardial effusion. Mediastinum/Nodes: Small mediastinal and bilateral hilar lymph nodes, typically reactive. No visualized thyroid nodule. Enteric tube in place with slight esophageal distension. No esophageal wall thickening. Lungs/Pleura: Mild emphysema. There are small bilateral pleural effusions. Associated compressive atelectasis. Presumed chronic round atelectasis at the left lung base with adjacent pleural calcifications. There is mild bronchial thickening in the segmental lower lobes. The additional areas of ground-glass opacity on prior abdominal CT have improved in the interim. Debris within the trachea. Subsegmental bronchial occlusion in the left lower lobe. No septal thickening or pulmonary edema. Musculoskeletal: There are no acute or suspicious osseous abnormalities. Review of the MIP images confirms the above findings. CT ABDOMEN and PELVIS FINDINGS Hepatobiliary: Focal fatty infiltration adjacent to the falciform ligament. No other focal hepatic abnormality. Resolved pericholecystic fluid from prior. Pancreas: No ductal dilatation or inflammation. Spleen: Normal in size without focal abnormality. No evidence of splenic injury. No perisplenic fluid. Adrenals/Urinary Tract: Normal right adrenal gland. Stable left adrenal thickening. No hydronephrosis or perinephric edema. Homogeneous renal enhancement with symmetric excretion on delayed phase imaging. Urinary bladder is decompressed by  Foley catheter. Stomach/Bowel: Enteric tube tip in the stomach. The stomach is distended with intraluminal fluid. Duodenum a slightly dilated and fluid-filled. Severe dilated with some intraluminal fluid. No obstruction, with enteric contrast reaching the colon. There is a general transition from dilated to nondilated small bowel without transition point no small bowel pneumatosis. Some of the small bowel loops in the right lower quadrant are slightly edematous, however no evidence of pneumatosis. Enteric contrast reaches the colon. There is a moderate volume of colonic stool. No wall thickening of the right colon. There is a 10 cm length wall thickening of the proximal descending colon, coronal reformat series 14, image 52. Colonic diverticulosis without focal diverticulitis. Vascular/Lymphatic: Aortic and branch atherosclerosis. No portal venous or mesenteric gas. No bulky adenopathy. Reproductive: Prominent prostate gland with coarse calcification again seen. Other: Scattered foci of pneumoperitoneum, not unexpected post recent abdominal surgery. Mild generalized fat stranding with small amount of non organized free fluid in the pericolic gutters and tracking into the pelvis. No visualized abscess. Air in the subcutaneous tissues of the anterior abdominal wall from recent surgery. No subcutaneous collection. Soft tissue thickening standing into the right inguinal canal with persistent but improved non organized fluid, likely sequela of recent herniorrhaphy. 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 Stable degenerative change in the spine from recent prior. Review of the MIP images confirms the above findings. IMPRESSION: 1. No pulmonary embolus. 2. Small bilateral pleural effusions with compressive atelectasis. The additional areas of ground-glass opacity in the lung bases on prior abdominal CT have improved in the interim. 3. Debris within the trachea with  mild bronchial thickening. Subsegmental bronchial occlusion in the left lower lobe. This may be due to mucous plugging/retained secretions or aspiration. 4. No evidence of bowel injury post recent lysis of adhesions. Some proximal bowel dilatation is likely related to a degree of postoperative ileus. Enteric contrast reaches the colon. Free air is expected 2 days post intra-abdominal surgery. 5. Segment of wall thickening involving the descending colon which is nonspecific. 6. Colonic diverticulosis without diverticulitis. 7. Sequela of recent right inguinal Aortic Atherosclerosis (ICD10-I70.0) and Emphysema (ICD10-J43.9). These results were discussed by telephone at the time of interpretation on 02/03/2020 at 5:09 pm to provider FAERA BYERLY. Electronically Signed   By: Narda RutherfordMelanie  Sanford M.D.   On: 02/03/2020 17:09   CT ABDOMEN PELVIS W CONTRAST  Result Date: 02/03/2020 CLINICAL DATA:  PE suspected, high prob; Abdominal abscess/infection suspected Hypotension and tachycardia 2 days post lysis of adhesions. EXAM: CT ANGIOGRAPHY CHEST CT ABDOMEN AND PELVIS WITH CONTRAST TECHNIQUE: Multidetector CT imaging of the chest was performed using the standard protocol during bolus administration of intravenous contrast. Multiplanar CT image reconstructions and MIPs were obtained to evaluate the vascular anatomy. Multidetector CT imaging of the abdomen and pelvis was performed using the standard protocol during bolus administration of intravenous contrast. CONTRAST:  100mL OMNIPAQUE IOHEXOL 350 MG/ML SOLN COMPARISON:  Radiograph earlier today. Preoperative abdominal CT 01/29/2020 FINDINGS: CTA CHEST FINDINGS Cardiovascular: There are no filling defects within the pulmonary arteries to suggest pulmonary embolus. Breathing motion artifact limits detailed assessment. Aortic atherosclerosis without dissection. No aortic aneurysm. Right upper extremity central line tip in the lower SVC. The heart is normal in size. There are  coronary artery calcifications. No pericardial effusion. Mediastinum/Nodes: Small mediastinal and bilateral hilar lymph nodes, typically reactive. No visualized thyroid nodule. Enteric tube in place with slight esophageal distension. No esophageal wall thickening. Lungs/Pleura: Mild emphysema. There  are small bilateral pleural effusions. Associated compressive atelectasis. Presumed chronic round atelectasis at the left lung base with adjacent pleural calcifications. There is mild bronchial thickening in the segmental lower lobes. The additional areas of ground-glass opacity on prior abdominal CT have improved in the interim. Debris within the trachea. Subsegmental bronchial occlusion in the left lower lobe. No septal thickening or pulmonary edema. Musculoskeletal: There are no acute or suspicious osseous abnormalities. Review of the MIP images confirms the above findings. CT ABDOMEN and PELVIS FINDINGS Hepatobiliary: Focal fatty infiltration adjacent to the falciform ligament. No other focal hepatic abnormality. Resolved pericholecystic fluid from prior. Pancreas: No ductal dilatation or inflammation. Spleen: Normal in size without focal abnormality. No evidence of splenic injury. No perisplenic fluid. Adrenals/Urinary Tract: Normal right adrenal gland. Stable left adrenal thickening. No hydronephrosis or perinephric edema. Homogeneous renal enhancement with symmetric excretion on delayed phase imaging. Urinary bladder is decompressed by Foley catheter. Stomach/Bowel: Enteric tube tip in the stomach. The stomach is distended with intraluminal fluid. Duodenum a slightly dilated and fluid-filled. Severe dilated with some intraluminal fluid. No obstruction, with enteric contrast reaching the colon. There is a general transition from dilated to nondilated small bowel without transition point no small bowel pneumatosis. Some of the small bowel loops in the right lower quadrant are slightly edematous, however no evidence  of pneumatosis. Enteric contrast reaches the colon. There is a moderate volume of colonic stool. No wall thickening of the right colon. There is a 10 cm length wall thickening of the proximal descending colon, coronal reformat series 14, image 52. Colonic diverticulosis without focal diverticulitis. Vascular/Lymphatic: Aortic and branch atherosclerosis. No portal venous or mesenteric gas. No bulky adenopathy. Reproductive: Prominent prostate gland with coarse calcification again seen. Other: Scattered foci of pneumoperitoneum, not unexpected post recent abdominal surgery. Mild generalized fat stranding with small amount of non organized free fluid in the pericolic gutters and tracking into the pelvis. No visualized abscess. Air in the subcutaneous tissues of the anterior abdominal wall from recent surgery. No subcutaneous collection. Soft tissue thickening standing into the right inguinal canal with persistent but improved non organized fluid, likely sequela of recent herniorrhaphy. 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 Stable degenerative change in the spine from recent prior. Review of the MIP images confirms the above findings. IMPRESSION: 1. No pulmonary embolus. 2. Small bilateral pleural effusions with compressive atelectasis. The additional areas of ground-glass opacity in the lung bases on prior abdominal CT have improved in the interim. 3. Debris within the trachea with mild bronchial thickening. Subsegmental bronchial occlusion in the left lower lobe. This may be due to mucous plugging/retained secretions or aspiration. 4. No evidence of bowel injury post recent lysis of adhesions. Some proximal bowel dilatation is likely related to a degree of postoperative ileus. Enteric contrast reaches the colon. Free air is expected 2 days post intra-abdominal surgery. 5. Segment of wall thickening involving the descending colon which is nonspecific. 6.  Colonic diverticulosis without diverticulitis. 7. Sequela of recent right inguinal Aortic Atherosclerosis (ICD10-I70.0) and Emphysema (ICD10-J43.9). These results were discussed by telephone at the time of interpretation on 02/03/2020 at 5:09 pm to provider FAERA BYERLY. Electronically Signed   By: Narda Rutherford M.D.   On: 02/03/2020 17:09   DG CHEST PORT 1 VIEW  Result Date: 02/03/2020 CLINICAL DATA:  Hypoxia EXAM: PORTABLE CHEST 1 VIEW COMPARISON:  01/27/2018 FINDINGS: Nasogastric tube with the tip projecting over the stomach. Mild left basilar scarring.  No focal consolidation. No pleural effusion or pneumothorax. Heart and mediastinal contours are unremarkable. No acute osseous abnormality. IMPRESSION: No active disease. Electronically Signed   By: Elige Ko   On: 02/03/2020 12:39    Anti-infectives: Anti-infectives (From admission, onward)   Start     Dose/Rate Route Frequency Ordered Stop   02/04/20 1400  anidulafungin (ERAXIS) 100 mg in sodium chloride 0.9 % 100 mL IVPB        100 mg 78 mL/hr over 100 Minutes Intravenous Every 24 hours 02/03/20 1241     02/03/20 1400  piperacillin-tazobactam (ZOSYN) IVPB 3.375 g  Status:  Discontinued        3.375 g 12.5 mL/hr over 240 Minutes Intravenous Every 8 hours 02/03/20 1237 02/03/20 1239   02/03/20 1400  anidulafungin (ERAXIS) 200 mg in sodium chloride 0.9 % 200 mL IVPB        200 mg 78 mL/hr over 200 Minutes Intravenous  Once 02/03/20 1241 02/03/20 2024   02/03/20 1400  meropenem (MERREM) 1 g in sodium chloride 0.9 % 100 mL IVPB        1 g 200 mL/hr over 30 Minutes Intravenous Every 8 hours 02/03/20 1301     02/03/20 1330  ceFEPIme (MAXIPIME) 2 g in sodium chloride 0.9 % 100 mL IVPB  Status:  Discontinued        2 g 200 mL/hr over 30 Minutes Intravenous  Once 02/03/20 1237 02/03/20 1239   02/01/20 1345  ceFAZolin (ANCEF) 2-4 GM/100ML-% IVPB       Note to Pharmacy: Sharyn Creamer   : cabinet override      02/01/20 1345 02/02/20 0159    01/25/20 0500  ceFAZolin (ANCEF) IVPB 2g/100 mL premix        2 g 200 mL/hr over 30 Minutes Intravenous Every 8 hours 01/25/20 0004 01/25/20 0537   01/24/20 2103  ceFAZolin (ANCEF) 2-4 GM/100ML-% IVPB       Note to Pharmacy: Johnette Abraham   : cabinet override      01/24/20 2103 01/24/20 2134   01/24/20 2015  ceFAZolin (ANCEF) IVPB 2g/100 mL premix        2 g 200 mL/hr over 30 Minutes Intravenous  Once 01/24/20 2014 01/24/20 2134      Assessment/Plan: DM - SSI, CBD checks HTN - will need to hold home meds due to NGT placement, will adjust to IV meds Urinary retention - resolved  POD11,3, s/popen rih repair for incarcerated RIHMagnus Lam, dx lap with LOA for SBO FB 12/15 -PICC/TNA -cont NGT and await resolution of ileus -mobilize TID -pulm toilet/IS -multi-modal pain control -PT eval pending  FEN -NPO/IVFs/NGT/TNA VTE -lovenox ID -none currently needed Dispo - await ileus resolution  looks better today  Normal lactate  CT show no bowel injury/abscess and contrast in the colon Keep in ICU today   LOS: 11 days    Dortha Schwalbe MD  02/04/2020

## 2020-02-04 NOTE — Progress Notes (Signed)
PHARMACY - TOTAL PARENTERAL NUTRITION CONSULT NOTE   Indication: Bowel obstruction  Patient Measurements: Height: 6\' 1"  (185.4 cm) Weight: 81.6 kg (179 lb 14.3 oz) IBW/kg (Calculated) : 79.9 TPN AdjBW (KG): 81.6 Body mass index is 23.73 kg/m. Usual Weight:   Assessment: Patient is a 74 y.o M presented to the ED on 12/7 with abdominal pain.  Abd CT showed obstruction secondary to right inguinal hernia.  He underwent hernia repair on 12/7.  He subsequently developed n/v s/p procedure, placed on NPO, and NG placed on 12/13. Pharmacy consulted on 12/15 to dose TPN.  Glucose / Insulin: sSSI - cbgs < 160  Electrolytes:  CorrCa 8.9, WNL; mag is WNL but dropped from 2.2>>>1.7 over last few days,  other lytes wnl Renal: scr <1 LFTs / TGs: LFTs wnl, 12/16 triglycerides 74 Prealbumin / albumin: prealbumin 7.5; Albumin 2.4 Intake / Output; MIVF: I/O -UOP 1625 ml; NG output 1600 ml GI Imaging: - 12/7 abd 14/7 bowel containing right inguinal hernia causing some degree of obstruction - 12/12 abd CT:  Small bowel diffusely dilated and fluid-filled with transition point in the right lower quadrant. Differential considerations include postoperative ileus or obstruction, the presence of a transition point fevers obstruction. Surgeries / Procedures:  -12/7: repair of inguinal incarcerated hernia with mesh - 12/13: NG placement - 12/15: plan for exp lap today  Central access: pending PICC placement TPN start date: 12/17   Nutritional Goals:  Dietician  Recommendations  On 12/16  Kcal:  2050-2250  Protein:  105-115g  Fluid:  2L/day  TPN at target rate of 90 ml/hr  - 2181 kcal - 108 grams of protein  - 2160 mL of fluid per day   Current Nutrition:  - NPO   - Potassium phosphate 20 mmol IV x1   Plan:  Now:   Mg sulfate 2 g iv once  At 1800:  Increase TPN to goal rate of 90 mL/hr at 1800  Will increase Mg to 7.5 meq/L and Na to 55 meq/L (to make enough volume of Na acetate  so that IV room doesn't have to add via syringe)   Electrolytes in TPN: 31mEq/L of Na, 55 mEq/L of K, 100mEq/L of Ca, 7.5 mEq/L of Mg, and 20 mmol/L of Phos. Cl:Ac ratio 1:1  Add standard MVI and trace elements to TPN  Sensitive q6h SSI and adjust as needed with plans to reduce to q 8 h if stable tomorrow  Continue MIVF to KVO mL/hr at 1800  BMP, magnesium, phosphate with AM labs   Monitor TPN labs on Mon/Thurs    4m  02/04/2020 7:25 AM

## 2020-02-04 NOTE — Progress Notes (Addendum)
NAME:  Patrick Lam, MRN:  [id number], DOB:  March 24, 1945, LOS: 11 ADMISSION DATE:  01/24/2020, CONSULTATION DATE: 12/17 REFERRING MD: Josefina Do, PA-C CHIEF COMPLAINT:  Sepsis    Brief History:  74  yowm  Cigar smoker with chronic cough admitted 12/7 with acute right inguinal hernia s/p open repair.  Post operative course complicated by bowel obstruction requiring repeat OR visit 12/15 for lysis of dense adhesions.  Developed concern for sepsis 12/17 and transferred to ICU.  W/u with Ct chest 12/17 revealed bronchiectasis in setting of GERD and  ? H/o aspiraton  History of Present Illness:  74 y/o M who presented to [facility] on 12/7 with acute onset painful bulge in his right groin.   Prior to admit, he was in his usual state of health when he developed acute onset pain.  Initial surgical evaluation noted a large hernia that was not reducible prompting urgent OR evaluation in the hopes of avoiding bowel ischemia. He was taken to the OR on 12/7 for open inguinal hernia repair.  Operative findings notable for an incarcerated direct right inguinal hernia with approximately 6 inches of near ischemic bowel which improved with opening of the hernia. Additional findings of involvement of the right testicular cord being pulled up and edematous.  ProGrip mesh was placed into the inguinal floor.  He was returned post-operatively to the medical floor.  He developed belching and nausea on 12/10.  KUB evaluation was concerning for possible ileus. On 12/12 he continued to have nausea and emesis.  CT of the abdomen on 12/12 demonstrated diffusely dilated small bowel, fluid-filled with transition point the the RLQ and progressive consolidation of both lung bases.  On 12/14 he underwent small bowel protocol. Post protocol there were ongoing concerns for obstruction and he returned to the OR on 12/15 for laparoscopic laparotomy which found dense adhesions in the pelvis and RLQ causing obstruction.  On 12/17 the patient  developed acute onset shortness of breath, tachycardia and hypotension. He was transferred to ICU per the Rapid Response Team with concerns for septic shock.  Labs from 12/17 - K 3.4, BUN 17 / Sr Cr 0.60.  CBC pending.  He did have a mild left shift on his CBC on 12/16.  He was pan cultured, resuscitated with 61ml/kg and treated with empiric abx.  CTA chest, CT ABD/Pelvis pending.   PCCM consulted for evaluation pm 12/17   Past Medical History:  Pre-Diabetes HTN Narcolepsy, Cataplexy  Arthritis   Significant Hospital Events:  12/07 Admit with right inguinal bulge, open right inguinal hernia repair  12/15 Return to OR for lysis of adhesions, SBO  12/17 Tx to ICU, PCCM consulted for concern of sepsis  Consults:  PCCM  12/17   Procedures:    Significant Diagnostic Tests:  CT ABD/Pelvis 12/7 >> small bowel containing right inguinal hernia causing some degree of obstruction.   CT ABD/Pelvis 12/12 >> s/p right inguinal hernia repari, small bowel diffusely dilated and fluid filled with transition point in the right lower quadrant, no residual bowel involvement of the right inguinal hernia, progressive consolidation of both lung bases, new areas of nodular GGO in the lingula, RML, mild gallbladder distention, colonic diverticulosis without acute diverticulitis CTa 12/17 1. No pulmonary embolus. 2. Small bilateral pleural effusions with compressive atelectasis. The additional areas of ground-glass opacity in the lung bases on prior abdominal CT have improved in the interim. 3. Debris within the trachea with mild bronchial thickening. Subsegmental bronchial occlusion in the left lower  lobe. This may be due to mucous plugging/retained secretions or aspiration. - Echo 12/18 >>>  Mild RVE o/w nl    Micro Data:  COVID 12/7 negative  MRSA  12/17 neg  Sputum 12/17 >>  BCx2 12/17 >>  UA 12/17 >>  Not sent   Antimicrobials:  Ancef 12/15 x one dose Meropenem 12/17 >>  Anidulafungin 12/17 >>    Scheduled Meds: . Chlorhexidine Gluconate Cloth  6 each Topical Daily  . clomiPRAMINE  25 mg Oral TID  . enoxaparin (LOVENOX) injection  40 mg Subcutaneous Q24H  . insulin aspart  0-9 Units Subcutaneous Q6H  . mouth rinse  15 mL Mouth Rinse BID  . pantoprazole (PROTONIX) IV  40 mg Intravenous Q24H  . sodium chloride flush  10-40 mL Intracatheter Q12H  . sodium chloride flush  3 mL Intravenous Q12H  . Sodium Oxybate  4,500 mg Oral Q24H   Continuous Infusions: . sodium chloride    . sodium chloride Stopped (02/04/20 0521)  . anidulafungin    . chlorproMAZINE (THORAZINE) IV 12.5 mg (01/31/20 1330)  . lactated ringers    . meropenem (MERREM) IV Stopped (02/04/20 0551)  . phenylephrine (NEO-SYNEPHRINE) Adult infusion Stopped (02/04/20 0515)  . sodium chloride    . TPN ADULT (ION) 65 mL/hr at 02/04/20 0700  . TPN ADULT (ION)     PRN Meds:.sodium chloride, chlorproMAZINE (THORAZINE) IV, diphenhydrAMINE **OR** diphenhydrAMINE, hydrALAZINE, labetalol, methylphenidate, morphine injection, ondansetron **OR** ondansetron (ZOFRAN) IV, perflutren lipid microspheres (DEFINITY) IV suspension, sodium chloride flush, sodium chloride flush, Sodium Oxybate     Interim History / Subjective:   off pressors, alert, denies sob or cp  Or abd pain   Objective   Blood pressure (!) 109/53, pulse 92, temperature 99.68 F (37.6 C), resp. rate (!) 22, height 6\' 1"  (1.854 m), weight 81.6 kg, SpO2 98 %.        Intake/Output Summary (Last 24 hours) at 02/04/2020 1054 Last data filed at 02/04/2020 0700 Gross per 24 hour  Intake 7407.77 ml  Output 2725 ml  Net 4682.77 ml   Filed Weights   01/25/20 0000  Weight: 81.6 kg    Examination: Tmax 100.6 Pt alert, approp nad @ 30 degrees hob and sats 99% on 5lpm NP No jvd Oropharynx clear,  mucosa nl Neck supple Lungs with a few scattered exp > insp rhonchi bilaterally RRR no s3 or or sign murmur Abd mod distended / limited  excursion and NG still  with signifcant outpt Extr warm with trace pitting edema   Neuro  Sensorium intactt,  no apparent motor deficits        Resolved Hospital Problem list     Assessment & Plan:   Shock, suspected Septic. Rule Out Obstructive.   - improved with vol replacement / off pressors  -  Echo ok 12/18   Open Right Inguinal Hernia Repair  Post-operative SBO  Initial surgical intervention on 12/7 with open right inguinal hernia repair. Returned to the OR on 12/15 for lysis of adhesions in the setting of SBO. At risk for bowel injury.   -post operative care per CCS  -NPO / continue NG suction  per CCS     Acute Hypoxemic Respiratory Failure  Suspect in setting of bibasilar atelectasis / splinting post abd surgery, possible aspiration PNA with prior vomiting - cta chest 12/17 reviewed ? Suggests recurrent asp ?     Hypokalemia  Hypophosphatemia  -monitor, replace as indicated   Hyperglycemia / DM -SSI, sensitive scale  -  holding  metformin    At Risk Malnutrition  -TPN per pharmacy   Hx Narcolepsy  -continue PTA medications   Hx HTN -hold home cardizem, losartan, diuretics  Best practice (evaluated daily)  Diet: NPO  Pain/Anxiety/Delirium protocol (if indicated): pain control per CCS  VAP protocol (if indicated): n/a  DVT prophylaxis: lovenox  GI prophylaxis: n/a  Glucose control: SSI Mobility: as tolerated  Disposition: ICU, per CCS   Goals of Care:  Last date of multidisciplinary goals of care discussion: pending Family and staff present: daughter Bonita QuinLinda 12/17  Code Status: Full Code   Labs   CBC: Recent Labs  Lab 01/30/20 0502 02/01/20 0603 02/02/20 0515 02/03/20 1245  WBC 5.9 9.6 13.6* 12.0*  NEUTROABS  --   --  10.7* 11.2*  HGB 13.1 13.0 12.9* 12.7*  HCT 40.5 39.8 40.2 38.8*  MCV 76.0* 75.4* 76.1* 75.8*  PLT 264 338 358 334    Basic Metabolic Panel: Recent Labs  Lab 02/01/20 0603 02/01/20 1635 02/02/20 0512 02/03/20 0425 02/03/20 1245  02/04/20 0520  NA 138  --  138 136 138 139  K 3.5  --  4.0 3.4* 3.2* 3.8  CL 102  --  103 100 104 106  CO2 24  --  25 27 22 24   GLUCOSE 89  --  98 145* 95 137*  BUN 23  --  22 17 22 19   CREATININE 0.75  --  0.69 0.60* 0.93 0.66  CALCIUM 8.2*  --  8.2* 8.0* 7.7* 7.6*  MG  --  2.2 2.0 2.0  --  1.7  PHOS  --  4.7* 3.7 2.5  --  3.4   GFR: Estimated Creatinine Clearance: 88.8 mL/min (by C-G formula based on SCr of 0.66 mg/dL). Recent Labs  Lab 01/30/20 0502 02/01/20 0603 02/02/20 0515 02/03/20 1217 02/03/20 1245 02/03/20 1308 02/03/20 1900 02/04/20 0520  PROCALCITON  --   --   --   --  3.11  --   --  33.44  WBC 5.9 9.6 13.6*  --  12.0*  --   --   --   LATICACIDVEN  --   --   --  4.0*  --  4.1* 1.9  --     Liver Function Tests: Recent Labs  Lab 02/02/20 0512  AST 23  ALT 23  ALKPHOS 49  BILITOT 0.7  PROT 5.5*  ALBUMIN 2.4*   No results for input(s): LIPASE, AMYLASE in the last 168 hours. No results for input(s): AMMONIA in the last 168 hours.  ABG    Component Value Date/Time   PHART 7.490 (H) 02/03/2020 1302   PCO2ART 28.7 (L) 02/03/2020 1302   PO2ART 55.2 (L) 02/03/2020 1302   HCO3 21.7 02/03/2020 1302   TCO2 27 01/24/2020 1935   ACIDBASEDEF 0.3 02/03/2020 1302   O2SAT 89.9 02/03/2020 1302     Coagulation Profile: Recent Labs  Lab 02/03/20 1245  INR 1.6*    Cardiac Enzymes: No results for input(s): CKTOTAL, CKMB, CKMBINDEX, TROPONINI in the last 168 hours.  HbA1C: Hgb A1c MFr Bld  Date/Time Value Ref Range Status  01/30/2020 05:02 AM 5.8 (H) 4.8 - 5.6 % Final    Comment:    (NOTE) Pre diabetes:          5.7%-6.4%  Diabetes:              >6.4%  Glycemic control for   <7.0% adults with diabetes   01/26/2018 02:12 PM 6.0 (H) 4.8 - 5.6 %  Final    Comment:    (NOTE) Pre diabetes:          5.7%-6.4% Diabetes:              >6.4% Glycemic control for   <7.0% adults with diabetes     CBG: Recent Labs  Lab 02/03/20 1137 02/03/20 1333  02/03/20 1816 02/03/20 2327 02/04/20 0517  GLUCAP 160* 97 127* 154* 119*     Sandrea Hughs, MD Pulmonary and Critical Care Medicine [facility] Cell [phone]   After 7:00 pm call Elink  [phone]

## 2020-02-05 DIAGNOSIS — R579 Shock, unspecified: Secondary | ICD-10-CM

## 2020-02-05 DIAGNOSIS — J9600 Acute respiratory failure, unspecified whether with hypoxia or hypercapnia: Secondary | ICD-10-CM

## 2020-02-05 HISTORY — DX: Shock, unspecified: R57.9

## 2020-02-05 LAB — URINALYSIS, ROUTINE W REFLEX MICROSCOPIC
Bilirubin Urine: NEGATIVE
Glucose, UA: NEGATIVE mg/dL
Ketones, ur: NEGATIVE mg/dL
Leukocytes,Ua: NEGATIVE
Nitrite: NEGATIVE
Protein, ur: NEGATIVE mg/dL
Specific Gravity, Urine: 1.019 (ref 1.005–1.030)
pH: 5 (ref 5.0–8.0)

## 2020-02-05 LAB — BASIC METABOLIC PANEL
Anion gap: 8 (ref 5–15)
BUN: 19 mg/dL (ref 8–23)
CO2: 26 mmol/L (ref 22–32)
Calcium: 7.7 mg/dL — ABNORMAL LOW (ref 8.9–10.3)
Chloride: 104 mmol/L (ref 98–111)
Creatinine, Ser: 0.53 mg/dL — ABNORMAL LOW (ref 0.61–1.24)
GFR, Estimated: >60 mL/min (ref >60)
Glucose, Bld: 145 mg/dL — ABNORMAL HIGH (ref 70–99)
Potassium: 3.6 mmol/L (ref 3.5–5.1)
Sodium: 138 mmol/L (ref 135–145)

## 2020-02-05 LAB — GLUCOSE, CAPILLARY
Glucose-Capillary: 127 mg/dL — ABNORMAL HIGH (ref 70–99)
Glucose-Capillary: 149 mg/dL — ABNORMAL HIGH (ref 70–99)
Glucose-Capillary: 155 mg/dL — ABNORMAL HIGH (ref 70–99)
Glucose-Capillary: 169 mg/dL — ABNORMAL HIGH (ref 70–99)

## 2020-02-05 LAB — PHOSPHORUS: Phosphorus: 2.8 mg/dL (ref 2.5–4.6)

## 2020-02-05 LAB — PROCALCITONIN: Procalcitonin: 17.88 ng/mL

## 2020-02-05 LAB — MAGNESIUM: Magnesium: 2.2 mg/dL (ref 1.7–2.4)

## 2020-02-05 MED ORDER — LIP MEDEX EX OINT
1.0000 "application " | TOPICAL_OINTMENT | Freq: Two times a day (BID) | CUTANEOUS | Status: DC
  Administered 2020-02-05 – 2020-02-15 (×21): 1 via TOPICAL
  Filled 2020-02-05 (×2): qty 7

## 2020-02-05 MED ORDER — SODIUM CHLORIDE 0.9 % IV SOLN: 250.0000 mL | INTRAVENOUS | Status: DC

## 2020-02-05 MED ORDER — DIPHENHYDRAMINE HCL 50 MG/ML IJ SOLN: 12.5000 mg | Freq: Four times a day (QID) | INTRAMUSCULAR | Status: DC | PRN

## 2020-02-05 MED ORDER — TRAVASOL 10 % IV SOLN
INTRAVENOUS | Status: AC
  Filled 2020-02-05: qty 1080

## 2020-02-05 MED ORDER — ACETAMINOPHEN 650 MG RE SUPP: 650.0000 mg | Freq: Four times a day (QID) | RECTAL | Status: DC | PRN

## 2020-02-05 MED ORDER — ENALAPRILAT 1.25 MG/ML IV SOLN
0.6250 mg | Freq: Four times a day (QID) | INTRAVENOUS | Status: DC | PRN
  Filled 2020-02-05: qty 1

## 2020-02-05 MED ORDER — PHENOL 1.4 % MT LIQD
2.0000 | OROMUCOSAL | Status: DC | PRN
  Filled 2020-02-05: qty 177

## 2020-02-05 MED ORDER — POTASSIUM PHOSPHATES 15 MMOLE/5ML IV SOLN
20.0000 mmol | Freq: Once | INTRAVENOUS | Status: AC
  Administered 2020-02-05: 08:00:00 20 mmol via INTRAVENOUS
  Filled 2020-02-05: qty 6.67

## 2020-02-05 MED ORDER — FENTANYL CITRATE (PF) 100 MCG/2ML IJ SOLN
12.5000 ug | INTRAMUSCULAR | Status: DC | PRN
  Administered 2020-02-06 – 2020-02-14 (×27): 25 ug via INTRAVENOUS
  Filled 2020-02-05 (×27): qty 2

## 2020-02-05 MED ORDER — BISACODYL 10 MG RE SUPP
10.0000 mg | Freq: Every day | RECTAL | Status: DC
  Administered 2020-02-05 – 2020-02-12 (×8): 10 mg via RECTAL
  Filled 2020-02-05 (×9): qty 1

## 2020-02-05 MED ORDER — INSULIN ASPART 100 UNIT/ML ~~LOC~~ SOLN
0.0000 [IU] | Freq: Three times a day (TID) | SUBCUTANEOUS | Status: DC
  Administered 2020-02-05: 15:00:00 2 [IU] via SUBCUTANEOUS
  Administered 2020-02-05 – 2020-02-10 (×12): 1 [IU] via SUBCUTANEOUS

## 2020-02-05 MED ORDER — FENTANYL CITRATE (PF) 100 MCG/2ML IJ SOLN: 25.0000 ug | INTRAMUSCULAR | Status: DC | PRN

## 2020-02-05 MED ORDER — MAGIC MOUTHWASH
15.0000 mL | Freq: Four times a day (QID) | ORAL | Status: DC | PRN
  Filled 2020-02-05: qty 15

## 2020-02-05 MED ORDER — MENTHOL 3 MG MT LOZG: 1.0000 | LOZENGE | OROMUCOSAL | Status: DC | PRN

## 2020-02-05 MED ORDER — METHOCARBAMOL 1000 MG/10ML IJ SOLN
1000.0000 mg | Freq: Four times a day (QID) | INTRAVENOUS | Status: DC | PRN
  Administered 2020-02-06 – 2020-02-11 (×3): 1000 mg via INTRAVENOUS
  Filled 2020-02-05 (×2): qty 10
  Filled 2020-02-05 (×2): qty 1000
  Filled 2020-02-05 (×3): qty 10

## 2020-02-05 MED ORDER — SIMETHICONE 40 MG/0.6ML PO SUSP
40.0000 mg | Freq: Four times a day (QID) | ORAL | Status: DC | PRN
  Administered 2020-02-11: 40 mg via ORAL
  Filled 2020-02-05 (×3): qty 0.6

## 2020-02-05 MED ORDER — ALUM & MAG HYDROXIDE-SIMETH 200-200-20 MG/5ML PO SUSP
30.0000 mL | Freq: Four times a day (QID) | ORAL | Status: DC | PRN
  Administered 2020-02-05 – 2020-02-12 (×6): 30 mL via ORAL
  Filled 2020-02-05 (×6): qty 30

## 2020-02-05 NOTE — Progress Notes (Signed)
PHARMACY - TOTAL PARENTERAL NUTRITION CONSULT NOTE   Indication: Bowel obstruction  Patient Measurements: Height: 6\' 1"  (185.4 cm) Weight: 81.6 kg (179 lb 14.3 oz) IBW/kg (Calculated) : 79.9 TPN AdjBW (KG): 81.6 Body mass index is 23.73 kg/m. Usual Weight:   Assessment: Patient is a 74 y.o M presented to the ED on 12/7 with abdominal pain.  Abd CT showed obstruction secondary to right inguinal hernia.  He underwent hernia repair on 12/7.  He subsequently developed n/v s/p procedure, placed on NPO, and NG placed on 12/13. Pharmacy consulted on 12/15 to dose TPN.  Glucose / Insulin: sSSI q 6 h - cbgs < 160, total of 6 units SSI in 24 hours  Electrolytes:  CorrCa 9, WNL; ther lytes wnl though phos trending down and K is low normal after increasing TPN to goal yesterday Renal: scr <1 LFTs / TGs: LFTs wnl (12/16), 12/16 triglycerides 74 Prealbumin / albumin: prealbumin 7.5; Albumin 2.4 Intake / Output; MIVF: I/O -UOP 1350 ml; NG output 2150 ml; NS @ KVO GI Imaging: - 12/7 abd 14/7 bowel containing right inguinal hernia causing some degree of obstruction - 12/12 abd CT:  Small bowel diffusely dilated and fluid-filled with transition point in the right lower quadrant. Differential considerations include postoperative ileus or obstruction, the presence of a transition point fevers obstruction. 12/17 abd CT: No evidence of bowel injury post recent lysis of adhesions. Some proximal bowel dilatation is likely related to a degree of postoperative ileus. Enteric contrast reaches the colon. Free air is expected 2 days post intra-abdominal surgery.Segment of wall thickening involving the descending colon which is nonspecific.Colonic diverticulosis without diverticulitis.Sequela of recent right inguinal Surgeries / Procedures:  -12/7: repair of inguinal incarcerated hernia with mesh - 12/13: NG placement - 12/15: ex lap, LOA  Central access: pending PICC placement TPN start date: 12/17    Nutritional Goals:  Dietician  Recommendations  On 12/16  Kcal:  2050-2250  Protein:  105-115g  Fluid:  2L/day  TPN at target rate of 90 ml/hr  - 2181 kcal - 108 grams of protein  - 2160 mL of fluid per day   Current Nutrition:  TPN  Plan:  Now:   Kphos 20 mmol iv once  At 1800:  Continue TPN at goal rate of 90 mL/hr at 1800  Continue current electrolytes  Electrolytes in TPN: 12mEq/L of Na (to make enough volume of Na acetate so that IV room doesn't have to add via syringe), 55 mEq/L of K, 49mEq/L of Ca, 7.5 mEq/L of Mg, and 20 mmol/L of Phos. Cl:Ac ratio 1:1  Add standard MVI and trace elements to TPN  Reduce sSSI to q 8 hours  Continue MIVF at [facility]  Monitor TPN labs on Mon/Thurs  [facility]  02/05/2020 8:24 AM

## 2020-02-05 NOTE — Progress Notes (Addendum)
Patrick Lam [id number] 1945-06-01  CARE TEAM:  PCP: Herma Carson, MD  Outpatient Care Team: Patient Care Team: Herma Carson, MD as PCP - General (Family Medicine)  Inpatient Treatment Team: Treatment Team: Attending Provider: Bishop Limbo, MD; Rounding Team: Montez Morita, Md, MD; Rounding Team: Pccm, Md, MD; Registered Nurse: Gwenyth Allegra, RN; Technician: Delynn Flavin; Technician: Franky Macho, NT; Utilization Review: Deveron Furlong, RN; Registered Nurse: Henreitta Cea T, RN   Problem List:   Active Problems:   Incarcerated right inguinal hernia   Acute respiratory failure (HCC)   Shock circulatory (HCC)   4 Days Post-Op    02/01/2020  PRE-OPERATIVE DIAGNOSIS: small bowel obstruction  POST-OPERATIVE DIAGNOSIS:  Same  PROCEDURE:  Laparoscopic lysis of adhesions x 90 min  SURGEON:  Almond Lint, MD  01/24/2020  Pre-op Diagnosis: incarcetated right inguinal herinia     Post-op Diagnosis: same  Procedure(s): Repair of incarcerated right inguinal hernia with mesh  Surgeon(s): Abigail Miyamoto, MD  Assessment  Stable  Ileus  Malnutrition  [facility] Stay = 12 days)  Assessment/Plan: DM - SSI, CBD checks HTN - will need to hold home meds due to NGT placement, will adjust to IV meds Urinary retention -resolved  POD,  s/popen RIH repair for incarcerated RIH- 12/7 Dr. Magnus Ivan dx lap with LOA for SBO - 12/15 Dr Donell Beers -PICC/TNA -cont NGT and await resolution of ileus - maybe try NGT clamping trial in 1-2 days  -mobilize TID -pulm toilet/IS -multi-modal pain control -PT evalpending -transfer to floor  FEN -NPO/IVFs/NGT/TNA DM - SSI HTN _ PRN meds for now VTE -lovenox ID -none currently needed from abd but started on merropenem/eraxis per CCM Dispo - await ileus resolution     Disposition:  Disposition:  The patient is from: Home  Anticipate discharge to:  Skilled Nursing Facility  Anticipated Date of Discharge  is:  February 19, 2020  Barriers to discharge:  Pending Clinical improvement (more likely than not  Patient currently is NOT MEDICALLY STABLE for discharge from the hospital from a surgery standpoint.   30 minutes spent in review, evaluation, examination, counseling, and coordination of care.  More than 50% of that time was spent in counseling.  02/05/2020    Subjective: (Chief complaint)  Patient more comfortable.  Claims to passing gas and bowel movement.  ICU nursing coming into her room  Objective:  Vital signs:  Vitals:   02/05/20 0200 02/05/20 0300 02/05/20 0400 02/05/20 0500  BP: (!) 154/74 (!) 160/71 (!) 146/63 (!) 161/71  Pulse: 95 96 91 95  Resp: (!) 22 (!) 21 20 (!) 22  Temp: 98.6 F (37 C) 98.6 F (37 C) 98.6 F (37 C) 98.6 F (37 C)  TempSrc:      SpO2: 99% 97% 100% 99%  Weight:      Height:        Last BM Date: 01/29/20  Intake/Output   Yesterday:  12/18 0701 - 12/19 0700 In: 1880.4 [I.V.:1503.6; IV Piggyback:376.8] Out: 3500 [Urine:1350; Emesis/NG output:2150] This shift:  No intake/output data recorded.  Bowel function:  Flatus: YES  BM:  YES  Drain: Bilious   Physical Exam:  General: Pt awake/alert in no acute distress Eyes: PERRL, normal EOM.  Sclera clear.  No icterus Neuro: CN II-XII intact w/o focal sensory/motor deficits. Lymph: No head/neck/groin lymphadenopathy Psych:  No delerium/psychosis/paranoia.  Oriented x 4 HENT: Normocephalic, Mucus membranes moist.  No thrush Neck: Supple, No tracheal deviation.  No obvious thyromegaly Chest: No pain to chest  wall compression.  Good respiratory excursion.  No audible wheezing CV:  Pulses intact.  Regular rhythm.  No major extremity edema MS: Normal AROM mjr joints.  No obvious deformity  Abdomen: Soft.  Nondistended.  Mildly tender at incisions only.  No evidence of peritonitis.  No incarcerated hernias.  GU: Right groin incision with normal healing ridge.  No cellulitis or  drainage.  No obvious recurrent hernias  Ext:   No deformity.  No mjr edema.  No cyanosis Skin: No petechiae / purpurea.  No major sores.  Warm and dry    Results:   Cultures: Recent Results (from the past 720 hour(s))  Resp Panel by RT-PCR (Flu A&B, Covid) Nasopharyngeal Swab     Status: None   Collection Time: 01/24/20  6:56 PM   Specimen: Nasopharyngeal Swab; Nasopharyngeal(NP) swabs in vial transport medium  Result Value Ref Range Status   SARS Coronavirus 2 by RT PCR NEGATIVE NEGATIVE Final    Comment: (NOTE) SARS-CoV-2 target nucleic acids are NOT DETECTED.  The SARS-CoV-2 RNA is generally detectable in upper respiratory specimens during the acute phase of infection. The lowest concentration of SARS-CoV-2 viral copies this assay can detect is 138 copies/mL. A negative result does not preclude SARS-Cov-2 infection and should not be used as the sole basis for treatment or other patient management decisions. A negative result may occur with  improper specimen collection/handling, submission of specimen other than nasopharyngeal swab, presence of viral mutation(s) within the areas targeted by this assay, and inadequate number of viral copies(<138 copies/mL). A negative result must be combined with clinical observations, patient history, and epidemiological information. The expected result is Negative.  Fact Sheet for Patients:  BloggerCourse.com  Fact Sheet for Healthcare Providers:  SeriousBroker.it  This test is no t yet approved or cleared by the [place] FDA and  has been authorized for detection and/or diagnosis of SARS-CoV-2 by FDA under an Emergency Use Authorization (EUA). This EUA will remain  in effect (meaning this test can be used) for the duration of the COVID-19 declaration under Section 564(b)(1) of the Act, 21 U.S.C.section 360bbb-3(b)(1), unless the authorization is terminated  or revoked sooner.        Influenza A by PCR NEGATIVE NEGATIVE Final   Influenza B by PCR NEGATIVE NEGATIVE Final    Comment: (NOTE) The Xpert Xpress SARS-CoV-2/FLU/RSV plus assay is intended as an aid in the diagnosis of influenza from Nasopharyngeal swab specimens and should not be used as a sole basis for treatment. Nasal washings and aspirates are unacceptable for Xpert Xpress SARS-CoV-2/FLU/RSV testing.  Fact Sheet for Patients: BloggerCourse.com  Fact Sheet for Healthcare Providers: SeriousBroker.it  This test is not yet approved or cleared by the [place] FDA and has been authorized for detection and/or diagnosis of SARS-CoV-2 by FDA under an Emergency Use Authorization (EUA). This EUA will remain in effect (meaning this test can be used) for the duration of the COVID-19 declaration under Section 564(b)(1) of the Act, 21 U.S.C. section 360bbb-3(b)(1), unless the authorization is terminated or revoked.  Performed at [facility], [address], [place], [place] [address]   MRSA PCR Screening     Status: None   Collection Time: 02/03/20 12:17 PM   Specimen: Nasal Mucosa; Nasopharyngeal  Result Value Ref Range Status   MRSA by PCR NEGATIVE NEGATIVE Final    Comment:        The GeneXpert MRSA Assay (FDA approved for NASAL specimens only), is one component of a  comprehensive MRSA colonization surveillance program. It is not intended to diagnose MRSA infection nor to guide or monitor treatment for MRSA infections. Performed at [facility], [address] [place], [place] [address]   Culture, blood (x 2)     Status: None (Preliminary result)   Collection Time: 02/03/20  1:08 PM   Specimen: BLOOD RIGHT HAND  Result Value Ref Range Status   Specimen Description   Final    BLOOD RIGHT HAND Performed at [facility], [address] [place], [place] [address]    Special  Requests   Final    BOTTLES DRAWN AEROBIC AND ANAEROBIC Blood Culture adequate volume Performed at [facility], [address], [place], [place] [address]    Culture   Final    NO GROWTH 1 DAY Performed at [facility], [address] [place], [place] [address]    Report Status PENDING  Incomplete  Culture, blood (x 2)     Status: None (Preliminary result)   Collection Time: 02/03/20  1:23 PM   Specimen: BLOOD RIGHT HAND  Result Value Ref Range Status   Specimen Description   Final    BLOOD RIGHT HAND Performed at [facility], [address] [place], [place] [address]    Special Requests   Final    BOTTLES DRAWN AEROBIC AND ANAEROBIC Blood Culture adequate volume Performed at [facility], [address], [place], [place] [address]    Culture   Final    NO GROWTH 1 DAY Performed at [facility], [address], [place], [place] [address]    Report Status PENDING  Incomplete    Labs: Results for orders placed or performed during the hospital encounter of 01/24/20 (from the past 48 hour(s))  Glucose, capillary     Status: Abnormal   Collection Time: 02/03/20 11:37 AM  Result Value Ref Range   Glucose-Capillary 160 (H) 70 - 99 mg/dL    Comment: Glucose reference range applies only to samples taken after fasting for at least 8 hours.  Lactic acid, plasma     Status: Abnormal   Collection Time: 02/03/20 12:17 PM  Result Value Ref Range   Lactic Acid, Venous 4.0 (HH) 0.5 - 1.9 mmol/L    Comment: CRITICAL RESULT CALLED TO, READ BACK BY AND VERIFIED WITH: HALEY,M. RN @1328  02/03/20 BILLINGSLEY,L Performed at [facility], [address], [place], [place]   MRSA PCR Screening     Status: None   Collection Time: 02/03/20 12:17 PM   Specimen: Nasal Mucosa; Nasopharyngeal  Result Value Ref Range   MRSA by PCR NEGATIVE NEGATIVE    Comment:        The GeneXpert MRSA Assay  (FDA approved for NASAL specimens only), is one component of a comprehensive MRSA colonization surveillance program. It is not intended to diagnose MRSA infection nor to guide or monitor treatment for MRSA infections. Performed at [facility], [address], [place], [place]   CBC with Differential/Platelet     Status: Abnormal   Collection Time: 02/03/20 12:45 PM  Result Value Ref Range   WBC 12.0 (H) 4.0 - 10.5 K/uL    Comment: WHITE COUNT CONFIRMED ON SMEAR   RBC 5.12 4.22 - 5.81 MIL/uL   Hemoglobin 12.7 (L) 13.0 - 17.0 g/dL   HCT 02/05/20 (L) 50.3 - 88.8 %   MCV 75.8 (L) 80.0 - 100.0 fL  MCH 24.8 (L) 26.0 - 34.0 pg   MCHC 32.7 30.0 - 36.0 g/dL   RDW 16.1 09.6 - 04.5 %   Platelets 334 150 - 400 K/uL   nRBC 0.0 0.0 - 0.2 %   Neutrophils Relative % 94 %   Neutro Abs 11.2 (H) 1.7 - 7.7 K/uL   Lymphocytes Relative 4 %   Lymphs Abs 0.4 (L) 0.7 - 4.0 K/uL   Monocytes Relative 0 %   Monocytes Absolute 0.0 (L) 0.1 - 1.0 K/uL   Eosinophils Relative 0 %   Eosinophils Absolute 0.0 0.0 - 0.5 K/uL   Basophils Relative 0 %   Basophils Absolute 0.0 0.0 - 0.1 K/uL   WBC Morphology PLASMACYTOID LYMPHS     Comment: FEW BANDS PRESENT   Immature Granulocytes 2 %   Abs Immature Granulocytes 0.28 (H) 0.00 - 0.07 K/uL    Comment: Performed at [facility], [address] [place], [place] [address]  Basic metabolic panel     Status: Abnormal   Collection Time: 02/03/20 12:45 PM  Result Value Ref Range   Sodium 138 135 - 145 mmol/L   Potassium 3.2 (L) 3.5 - 5.1 mmol/L   Chloride 104 98 - 111 mmol/L   CO2 22 22 - 32 mmol/L   Glucose, Bld 95 70 - 99 mg/dL    Comment: Glucose reference range applies only to samples taken after fasting for at least 8 hours.   BUN 22 8 - 23 mg/dL   Creatinine, Ser 1.91 0.61 - 1.24 mg/dL   Calcium 7.7 (L) 8.9 - 10.3 mg/dL   GFR, Estimated >47 >82 mL/min    Comment: (NOTE) Calculated using the CKD-EPI  Creatinine Equation (2021)    Anion gap 12 5 - 15    Comment: Performed at [facility], [address] [place], [place] [address]  Protime-INR     Status: Abnormal   Collection Time: 02/03/20 12:45 PM  Result Value Ref Range   Prothrombin Time 18.4 (H) 11.4 - 15.2 seconds   INR 1.6 (H) 0.8 - 1.2    Comment: (NOTE) INR goal varies based on device and disease states. Performed at [facility], [address] [place], [place] [address]   Troponin I (High Sensitivity)     Status: Abnormal   Collection Time: 02/03/20 12:45 PM  Result Value Ref Range   Troponin I (High Sensitivity) 27 (H) <18 ng/L    Comment: (NOTE) Elevated high sensitivity troponin I (hsTnI) values and significant  changes across serial measurements may suggest ACS but many other  chronic and acute conditions are known to elevate hsTnI results.  Refer to the "Links" section for chest pain algorithms and additional  guidance. Performed at [facility], [address], [place], [place] [address]   Procalcitonin - Baseline     Status: None   Collection Time: 02/03/20 12:45 PM  Result Value Ref Range   Procalcitonin 3.11 ng/mL    Comment:        Interpretation: PCT > 2 ng/mL: Systemic infection (sepsis) is likely, unless other causes are known. (NOTE)       Sepsis PCT Algorithm           Lower Respiratory Tract                                      Infection PCT Algorithm    ----------------------------     ----------------------------  PCT < 0.25 ng/mL                PCT < 0.10 ng/mL          Strongly encourage             Strongly discourage   discontinuation of antibiotics    initiation of antibiotics    ----------------------------     -----------------------------       PCT 0.25 - 0.50 ng/mL            PCT 0.10 - 0.25 ng/mL               OR       >80% decrease in PCT            Discourage initiation of                                             antibiotics      Encourage discontinuation           of antibiotics    ----------------------------     -----------------------------         PCT >= 0.50 ng/mL              PCT 0.26 - 0.50 ng/mL               AND       <80% decrease in PCT              Encourage initiation of                                             antibiotics       Encourage continuation           of antibiotics    ----------------------------     -----------------------------        PCT >= 0.50 ng/mL                  PCT > 0.50 ng/mL               AND         increase in PCT                  Strongly encourage                                      initiation of antibiotics    Strongly encourage escalation           of antibiotics                                     -----------------------------                                           PCT <= 0.25 ng/mL  OR                                        > 80% decrease in PCT                                      Discontinue / Do not initiate                                             antibiotics  Performed at [facility], [address], [place], [place] [address]   Blood gas, arterial     Status: Abnormal   Collection Time: 02/03/20  1:02 PM  Result Value Ref Range   FIO2 48.00    O2 Content 7.0 L/min   pH, Arterial 7.490 (H) 7.350 - 7.450   pCO2 arterial 28.7 (L) 32.0 - 48.0 mmHg   pO2, Arterial 55.2 (L) 83.0 - 108.0 mmHg   Bicarbonate 21.7 20.0 - 28.0 mmol/L   Acid-base deficit 0.3 0.0 - 2.0 mmol/L   O2 Saturation 89.9 %   Patient temperature 101.5    Collection site LEFT RADIAL    Drawn by [id number]    Sample type ARTERIAL    Allens test (pass/fail) PASS PASS    Comment: Performed at [facility], [address] [place], [place] [address]  Culture, blood (x 2)     Status: None (Preliminary result)   Collection Time: 02/03/20  1:08 PM   Specimen: BLOOD RIGHT HAND   Result Value Ref Range   Specimen Description      BLOOD RIGHT HAND Performed at [facility], [address], [place], [place] [address]    Special Requests      BOTTLES DRAWN AEROBIC AND ANAEROBIC Blood Culture adequate volume Performed at [facility], [address] [place], [place] [address]    Culture      NO GROWTH 1 DAY Performed at [facility], [address], [place], [place] [address]    Report Status PENDING   Lactic acid, plasma     Status: Abnormal   Collection Time: 02/03/20  1:08 PM  Result Value Ref Range   Lactic Acid, Venous 4.1 (HH) 0.5 - 1.9 mmol/L    Comment: CRITICAL VALUE NOTED.  VALUE IS CONSISTENT WITH PREVIOUSLY REPORTED AND CALLED VALUE. Performed at [facility], [address], [place], [place] [address]   Culture, blood (x 2)     Status: None (Preliminary result)   Collection Time: 02/03/20  1:23 PM   Specimen: BLOOD RIGHT HAND  Result Value Ref Range   Specimen Description      BLOOD RIGHT HAND Performed at [facility], [address], [place], [place] [address]    Special Requests      BOTTLES DRAWN AEROBIC AND ANAEROBIC Blood Culture adequate volume Performed at [facility], [address], [place], [place] [address]    Culture      NO GROWTH 1 DAY Performed at [facility], [address] [place], [place] [address]    Report Status PENDING   Cortisol     Status: None  Collection Time: 02/03/20  1:23 PM  Result Value Ref Range   Cortisol, Plasma 53.5 ug/dL    Comment: (NOTE) AM    6.7 - 22.6 ug/dL PM   <03.4       ug/dL Performed at [facility], [address] [place], [place] [address]   Glucose, capillary     Status: None   Collection Time: 02/03/20  1:33 PM  Result Value Ref Range   Glucose-Capillary 97 70 - 99 mg/dL    Comment: Glucose reference range applies only to samples taken after fasting for at least  8 hours.  Glucose, capillary     Status: Abnormal   Collection Time: 02/03/20  6:16 PM  Result Value Ref Range   Glucose-Capillary 127 (H) 70 - 99 mg/dL    Comment: Glucose reference range applies only to samples taken after fasting for at least 8 hours.   Comment 1 Notify RN    Comment 2 Document in Chart   Troponin I (High Sensitivity)     Status: Abnormal   Collection Time: 02/03/20  7:00 PM  Result Value Ref Range   Troponin I (High Sensitivity) 408 (HH) <18 ng/L    Comment: CRITICAL RESULT CALLED TO, READ BACK BY AND VERIFIED WITH: KELLY GARCIA RN 02/03/20 @2005  BY P.HENDERSON (NOTE) Elevated high sensitivity troponin I (hsTnI) values and significant  changes across serial measurements may suggest ACS but many other  chronic and acute conditions are known to elevate hsTnI results.  Refer to the Links section for chest pain algorithms and additional  guidance. Performed at [facility], [address] [place], [place]   Lactic acid, plasma     Status: None   Collection Time: 02/03/20  7:00 PM  Result Value Ref Range   Lactic Acid, Venous 1.9 0.5 - 1.9 mmol/L    Comment: Performed at [facility], [address] [place], [place]  Troponin I (High Sensitivity)     Status: Abnormal   Collection Time: 02/03/20  9:04 PM  Result Value Ref Range   Troponin I (High Sensitivity) 381 (HH) <18 ng/L    Comment: CRITICAL RESULT CALLED TO, READ BACK BY AND VERIFIED WITH: 02/05/20 RN 02/03/2020 @2228  by P.HENDERSON (NOTE) Elevated high sensitivity troponin I (hsTnI) values and significant  changes across serial measurements may suggest ACS but many other  chronic and acute conditions are known to elevate hsTnI results.  Refer to the Links section for chest pain algorithms and additional  guidance. Performed at [facility], [address] [place], [place]   Glucose, capillary     Status:  Abnormal   Collection Time: 02/03/20 11:27 PM  Result Value Ref Range   Glucose-Capillary 154 (H) 70 - 99 mg/dL    Comment: Glucose reference range applies only to samples taken after fasting for at least 8 hours.  Troponin I (High Sensitivity)     Status: Abnormal   Collection Time: 02/03/20 11:35 PM  Result Value Ref Range   Troponin I (High Sensitivity) 355 (HH) <18 ng/L    Comment: CRITICAL RESULT CALLED TO, READ BACK BY AND VERIFIED WITH: KELLY GARCIA RN 02/04/2020 @0023  BY P.HENDERSON (NOTE) Elevated high sensitivity troponin I (hsTnI) values and significant  changes across serial measurements may suggest ACS but many other  chronic and acute conditions are known to elevate hsTnI results.  Refer to the Links section for chest pain algorithms and additional  guidance.  Performed at [facility], [address] [place], [place] [address]   Glucose, capillary     Status: Abnormal   Collection Time: 02/04/20  5:17 AM  Result Value Ref Range   Glucose-Capillary 119 (H) 70 - 99 mg/dL    Comment: Glucose reference range applies only to samples taken after fasting for at least 8 hours.  Basic metabolic panel     Status: Abnormal   Collection Time: 02/04/20  5:20 AM  Result Value Ref Range   Sodium 139 135 - 145 mmol/L   Potassium 3.8 3.5 - 5.1 mmol/L   Chloride 106 98 - 111 mmol/L   CO2 24 22 - 32 mmol/L   Glucose, Bld 137 (H) 70 - 99 mg/dL    Comment: Glucose reference range applies only to samples taken after fasting for at least 8 hours.   BUN 19 8 - 23 mg/dL   Creatinine, Ser 6.04 0.61 - 1.24 mg/dL   Calcium 7.6 (L) 8.9 - 10.3 mg/dL   GFR, Estimated >54 >09 mL/min    Comment: (NOTE) Calculated using the CKD-EPI Creatinine Equation (2021)    Anion gap 9 5 - 15    Comment: Performed at [facility], [address], [place], [place] [address]  Phosphorus     Status: None   Collection Time: 02/04/20  5:20 AM  Result Value Ref Range    Phosphorus 3.4 2.5 - 4.6 mg/dL    Comment: Performed at [facility], [address], [place], [place] [address]  Magnesium     Status: None   Collection Time: 02/04/20  5:20 AM  Result Value Ref Range   Magnesium 1.7 1.7 - 2.4 mg/dL    Comment: Performed at [facility], [address] [place], [place] [address]  Procalcitonin     Status: None   Collection Time: 02/04/20  5:20 AM  Result Value Ref Range   Procalcitonin 33.44 ng/mL    Comment:        Interpretation: PCT >= 10 ng/mL: Important systemic inflammatory response, almost exclusively due to severe bacterial sepsis or septic shock. (NOTE)       Sepsis PCT Algorithm           Lower Respiratory Tract                                      Infection PCT Algorithm    ----------------------------     ----------------------------         PCT < 0.25 ng/mL                PCT < 0.10 ng/mL          Strongly encourage             Strongly discourage   discontinuation of antibiotics    initiation of antibiotics    ----------------------------     -----------------------------       PCT 0.25 - 0.50 ng/mL            PCT 0.10 - 0.25 ng/mL               OR       >80% decrease in PCT            Discourage initiation of  antibiotics      Encourage discontinuation           of antibiotics    ----------------------------     -----------------------------         PCT >= 0.50 ng/mL              PCT 0.26 - 0.50 ng/mL                AND       <80% decrease in PCT             Encourage initiation of                                             antibiotics       Encourage continuation           of antibiotics    ----------------------------     -----------------------------        PCT >= 0.50 ng/mL                  PCT > 0.50 ng/mL               AND         increase in PCT                  Strongly encourage                                      initiation of  antibiotics    Strongly encourage escalation           of antibiotics                                     -----------------------------                                           PCT <= 0.25 ng/mL                                                 OR                                        > 80% decrease in PCT                                      Discontinue / Do not initiate                                             antibiotics  Performed at [facility], [address] [place], [place] [id number]   Glucose, capillary     Status: Abnormal   Collection  Time: 02/04/20 12:02 PM  Result Value Ref Range   Glucose-Capillary 155 (H) 70 - 99 mg/dL    Comment: Glucose reference range applies only to samples taken after fasting for at least 8 hours.  Glucose, capillary     Status: Abnormal   Collection Time: 02/04/20  5:38 PM  Result Value Ref Range   Glucose-Capillary 146 (H) 70 - 99 mg/dL    Comment: Glucose reference range applies only to samples taken after fasting for at least 8 hours.  Glucose, capillary     Status: Abnormal   Collection Time: 02/05/20 12:14 AM  Result Value Ref Range   Glucose-Capillary 155 (H) 70 - 99 mg/dL    Comment: Glucose reference range applies only to samples taken after fasting for at least 8 hours.  Procalcitonin     Status: None   Collection Time: 02/05/20  4:49 AM  Result Value Ref Range   Procalcitonin 17.88 ng/mL    Comment:        Interpretation: PCT >= 10 ng/mL: Important systemic inflammatory response, almost exclusively due to severe bacterial sepsis or septic shock. (NOTE)       Sepsis PCT Algorithm           Lower Respiratory Tract                                      Infection PCT Algorithm    ----------------------------     ----------------------------         PCT < 0.25 ng/mL                PCT < 0.10 ng/mL          Strongly encourage             Strongly discourage   discontinuation of antibiotics    initiation of  antibiotics    ----------------------------     -----------------------------       PCT 0.25 - 0.50 ng/mL            PCT 0.10 - 0.25 ng/mL               OR       >80% decrease in PCT            Discourage initiation of                                            antibiotics      Encourage discontinuation           of antibiotics    ----------------------------     -----------------------------         PCT >= 0.50 ng/mL              PCT 0.26 - 0.50 ng/mL                AND       <80% decrease in PCT             Encourage initiation of                                             antibiotics  Encourage continuation           of antibiotics    ----------------------------     -----------------------------        PCT >= 0.50 ng/mL                  PCT > 0.50 ng/mL               AND         increase in PCT                  Strongly encourage                                      initiation of antibiotics    Strongly encourage escalation           of antibiotics                                     -----------------------------                                           PCT <= 0.25 ng/mL                                                 OR                                        > 80% decrease in PCT                                      Discontinue / Do not initiate                                             antibiotics  Performed at [facility], [address], [place], [place] [address]   Magnesium     Status: None   Collection Time: 02/05/20  4:49 AM  Result Value Ref Range   Magnesium 2.2 1.7 - 2.4 mg/dL    Comment: Performed at [facility], [address] [place], [place] [address]  Phosphorus     Status: None   Collection Time: 02/05/20  4:49 AM  Result Value Ref Range   Phosphorus 2.8 2.5 - 4.6 mg/dL    Comment: Performed at [facility], [address] [place], [place] [address]  Basic metabolic panel     Status:  Abnormal   Collection Time: 02/05/20  4:49 AM  Result Value Ref Range   Sodium 138 135 - 145 mmol/L   Potassium 3.6 3.5 - 5.1 mmol/L   Chloride 104 98 - 111 mmol/L   CO2 26 22 - 32 mmol/L   Glucose, Bld 145 (H) 70 - 99 mg/dL    Comment: Glucose reference  range applies only to samples taken after fasting for at least 8 hours.   BUN 19 8 - 23 mg/dL   Creatinine, Ser 6.26 (L) 0.61 - 1.24 mg/dL   Calcium 7.7 (L) 8.9 - 10.3 mg/dL   GFR, Estimated >94 >85 mL/min    Comment: (NOTE) Calculated using the CKD-EPI Creatinine Equation (2021)    Anion gap 8 5 - 15    Comment: Performed at [facility], [address], [place], [place] [address]    Imaging / Studies: CT ANGIO CHEST PE W OR WO CONTRAST  Result Date: 02/03/2020 CLINICAL DATA:  PE suspected, high prob; Abdominal abscess/infection suspected Hypotension and tachycardia 2 days post lysis of adhesions. EXAM: CT ANGIOGRAPHY CHEST CT ABDOMEN AND PELVIS WITH CONTRAST TECHNIQUE: Multidetector CT imaging of the chest was performed using the standard protocol during bolus administration of intravenous contrast. Multiplanar CT image reconstructions and MIPs were obtained to evaluate the vascular anatomy. Multidetector CT imaging of the abdomen and pelvis was performed using the standard protocol during bolus administration of intravenous contrast. CONTRAST:  OMNIPAQUE IOHEXOL 350 MG/ML SOLN COMPARISON:  Radiograph earlier today. Preoperative abdominal CT 01/29/2020 FINDINGS: CTA CHEST FINDINGS Cardiovascular: There are no filling defects within the pulmonary arteries to suggest pulmonary embolus. Breathing motion artifact limits detailed assessment. Aortic atherosclerosis without dissection. No aortic aneurysm. Right upper extremity central line tip in the lower SVC. The heart is normal in size. There are coronary artery calcifications. No pericardial effusion. Mediastinum/Nodes: Small mediastinal and bilateral hilar lymph  nodes, typically reactive. No visualized thyroid nodule. Enteric tube in place with slight esophageal distension. No esophageal wall thickening. Lungs/Pleura: Mild emphysema. There are small bilateral pleural effusions. Associated compressive atelectasis. Presumed chronic round atelectasis at the left lung base with adjacent pleural calcifications. There is mild bronchial thickening in the segmental lower lobes. The additional areas of ground-glass opacity on prior abdominal CT have improved in the interim. Debris within the trachea. Subsegmental bronchial occlusion in the left lower lobe. No septal thickening or pulmonary edema. Musculoskeletal: There are no acute or suspicious osseous abnormalities. Review of the MIP images confirms the above findings. CT ABDOMEN and PELVIS FINDINGS Hepatobiliary: Focal fatty infiltration adjacent to the falciform ligament. No other focal hepatic abnormality. Resolved pericholecystic fluid from prior. Pancreas: No ductal dilatation or inflammation. Spleen: Normal in size without focal abnormality. No evidence of splenic injury. No perisplenic fluid. Adrenals/Urinary Tract: Normal right adrenal gland. Stable left adrenal thickening. No hydronephrosis or perinephric edema. Homogeneous renal enhancement with symmetric excretion on delayed phase imaging. Urinary bladder is decompressed by Foley catheter. Stomach/Bowel: Enteric tube tip in the stomach. The stomach is distended with intraluminal fluid. Duodenum a slightly dilated and fluid-filled. Severe dilated with some intraluminal fluid. No obstruction, with enteric contrast reaching the colon. There is a general transition from dilated to nondilated small bowel without transition point no small bowel pneumatosis. Some of the small bowel loops in the right lower quadrant are slightly edematous, however no evidence of pneumatosis. Enteric contrast reaches the colon. There is a moderate volume of colonic stool. No wall thickening of  the right colon. There is a 10 cm length wall thickening of the proximal descending colon, coronal reformat series 14, image 52. Colonic diverticulosis without focal diverticulitis. Vascular/Lymphatic: Aortic and branch atherosclerosis. No portal venous or mesenteric gas. No bulky adenopathy. Reproductive: Prominent prostate gland with coarse calcification again seen. Other: Scattered foci of pneumoperitoneum, not unexpected post recent abdominal surgery. Mild generalized fat stranding  with small amount of non organized free fluid in the pericolic gutters and tracking into the pelvis. No visualized abscess. Air in the subcutaneous tissues of the anterior abdominal wall from recent surgery. No subcutaneous collection. Soft tissue thickening standing into the right inguinal canal with persistent but improved non organized fluid, likely sequela of recent herniorrhaphy. 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 Stable degenerative change in the spine from recent prior. Review of the MIP images confirms the above findings. IMPRESSION: 1. No pulmonary embolus. 2. Small bilateral pleural effusions with compressive atelectasis. The additional areas of ground-glass opacity in the lung bases on prior abdominal CT have improved in the interim. 3. Debris within the trachea with mild bronchial thickening. Subsegmental bronchial occlusion in the left lower lobe. This may be due to mucous plugging/retained secretions or aspiration. 4. No evidence of bowel injury post recent lysis of adhesions. Some proximal bowel dilatation is likely related to a degree of postoperative ileus. Enteric contrast reaches the colon. Free air is expected 2 days post intra-abdominal surgery. 5. Segment of wall thickening involving the descending colon which is nonspecific. 6. Colonic diverticulosis without diverticulitis. 7. Sequela of recent right inguinal Aortic Atherosclerosis (ICD10-I70.0) and  Emphysema (ICD10-J43.9). These results were discussed by telephone at the time of interpretation on 02/03/2020 at 5:09 pm to provider FAERA BYERLY. Electronically Signed   By: Narda Rutherford M.D.   On: 02/03/2020 17:09   CT ABDOMEN PELVIS W CONTRAST  Result Date: 02/03/2020 CLINICAL DATA:  PE suspected, high prob; Abdominal abscess/infection suspected Hypotension and tachycardia 2 days post lysis of adhesions. EXAM: CT ANGIOGRAPHY CHEST CT ABDOMEN AND PELVIS WITH CONTRAST TECHNIQUE: Multidetector CT imaging of the chest was performed using the standard protocol during bolus administration of intravenous contrast. Multiplanar CT image reconstructions and MIPs were obtained to evaluate the vascular anatomy. Multidetector CT imaging of the abdomen and pelvis was performed using the standard protocol during bolus administration of intravenous contrast. CONTRAST:  OMNIPAQUE IOHEXOL 350 MG/ML SOLN COMPARISON:  Radiograph earlier today. Preoperative abdominal CT 01/29/2020 FINDINGS: CTA CHEST FINDINGS Cardiovascular: There are no filling defects within the pulmonary arteries to suggest pulmonary embolus. Breathing motion artifact limits detailed assessment. Aortic atherosclerosis without dissection. No aortic aneurysm. Right upper extremity central line tip in the lower SVC. The heart is normal in size. There are coronary artery calcifications. No pericardial effusion. Mediastinum/Nodes: Small mediastinal and bilateral hilar lymph nodes, typically reactive. No visualized thyroid nodule. Enteric tube in place with slight esophageal distension. No esophageal wall thickening. Lungs/Pleura: Mild emphysema. There are small bilateral pleural effusions. Associated compressive atelectasis. Presumed chronic round atelectasis at the left lung base with adjacent pleural calcifications. There is mild bronchial thickening in the segmental lower lobes. The additional areas of ground-glass opacity on prior abdominal CT have  improved in the interim. Debris within the trachea. Subsegmental bronchial occlusion in the left lower lobe. No septal thickening or pulmonary edema. Musculoskeletal: There are no acute or suspicious osseous abnormalities. Review of the MIP images confirms the above findings. CT ABDOMEN and PELVIS FINDINGS Hepatobiliary: Focal fatty infiltration adjacent to the falciform ligament. No other focal hepatic abnormality. Resolved pericholecystic fluid from prior. Pancreas: No ductal dilatation or inflammation. Spleen: Normal in size without focal abnormality. No evidence of splenic injury. No perisplenic fluid. Adrenals/Urinary Tract: Normal right adrenal gland. Stable left adrenal thickening. No hydronephrosis or perinephric edema. Homogeneous renal enhancement with symmetric excretion on delayed phase imaging. Urinary bladder is  decompressed by Foley catheter. Stomach/Bowel: Enteric tube tip in the stomach. The stomach is distended with intraluminal fluid. Duodenum a slightly dilated and fluid-filled. Severe dilated with some intraluminal fluid. No obstruction, with enteric contrast reaching the colon. There is a general transition from dilated to nondilated small bowel without transition point no small bowel pneumatosis. Some of the small bowel loops in the right lower quadrant are slightly edematous, however no evidence of pneumatosis. Enteric contrast reaches the colon. There is a moderate volume of colonic stool. No wall thickening of the right colon. There is a 10 cm length wall thickening of the proximal descending colon, coronal reformat series 14, image 52. Colonic diverticulosis without focal diverticulitis. Vascular/Lymphatic: Aortic and branch atherosclerosis. No portal venous or mesenteric gas. No bulky adenopathy. Reproductive: Prominent prostate gland with coarse calcification again seen. Other: Scattered foci of pneumoperitoneum, not unexpected post recent abdominal surgery. Mild generalized fat  stranding with small amount of non organized free fluid in the pericolic gutters and tracking into the pelvis. No visualized abscess. Air in the subcutaneous tissues of the anterior abdominal wall from recent surgery. No subcutaneous collection. Soft tissue thickening standing into the right inguinal canal with persistent but improved non organized fluid, likely sequela of recent herniorrhaphy. 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 Stable degenerative change in the spine from recent prior. Review of the MIP images confirms the above findings. IMPRESSION: 1. No pulmonary embolus. 2. Small bilateral pleural effusions with compressive atelectasis. The additional areas of ground-glass opacity in the lung bases on prior abdominal CT have improved in the interim. 3. Debris within the trachea with mild bronchial thickening. Subsegmental bronchial occlusion in the left lower lobe. This may be due to mucous plugging/retained secretions or aspiration. 4. No evidence of bowel injury post recent lysis of adhesions. Some proximal bowel dilatation is likely related to a degree of postoperative ileus. Enteric contrast reaches the colon. Free air is expected 2 days post intra-abdominal surgery. 5. Segment of wall thickening involving the descending colon which is nonspecific. 6. Colonic diverticulosis without diverticulitis. 7. Sequela of recent right inguinal Aortic Atherosclerosis (ICD10-I70.0) and Emphysema (ICD10-J43.9). These results were discussed by telephone at the time of interpretation on 02/03/2020 at 5:09 pm to provider FAERA BYERLY. Electronically Signed   By: Narda Rutherford M.D.   On: 02/03/2020 17:09   DG CHEST PORT 1 VIEW  Result Date: 02/03/2020 CLINICAL DATA:  Hypoxia EXAM: PORTABLE CHEST 1 VIEW COMPARISON:  01/27/2018 FINDINGS: Nasogastric tube with the tip projecting over the stomach. Mild left basilar scarring. No focal consolidation. No  pleural effusion or pneumothorax. Heart and mediastinal contours are unremarkable. No acute osseous abnormality. IMPRESSION: No active disease. Electronically Signed   By: Elige Ko   On: 02/03/2020 12:39   ECHOCARDIOGRAM COMPLETE  Result Date: 02/04/2020    ECHOCARDIOGRAM REPORT   Patient Name:   Patrick Lam Date of Exam: 02/04/2020 Medical Rec #:  [id number]     Height:       73.0 in Accession #:    [id number]    Weight:       179.9 lb Date of Birth:  1945/07/03      BSA:          2.057 m Patient Age:    74 years      BP:           105/49 mmHg Patient Gender: M  HR:           93 bpm. Exam Location:  Inpatient Procedure: 2D Echo, Color Doppler, Cardiac Doppler and Intracardiac            Opacification Agent STAT ECHO Indications:    Elevated Troponins  History:        Patient has no prior history of Echocardiogram examinations.                 Risk Factors:Hypertension.  Sonographer:    Irving Burton Senior RDCS Referring Phys: [id number] Migdalia Dk  Sonographer Comments: Technically difficult due to poor echo windows. Scanned supine due to patient condition. IMPRESSIONS  1. Left ventricular ejection fraction, by estimation, is 60 to 65%. The left ventricle has normal function. Wall motion difficult to assess due to poor acoustic windows but appears grossly normal. Left ventricular diastolic parameters were normal.  2. Right ventricular systolic function is normal. The right ventricular size is mildly enlarged.  3. The mitral valve is grossly normal. No evidence of mitral valve regurgitation.  4. The aortic valve was not well visualized. There is mild calcification of the aortic valve. There is mild thickening of the aortic valve. Aortic valve regurgitation is not visualized. Comparison(s): No prior Echocardiogram. FINDINGS  Left Ventricle: Left ventricular ejection fraction, by estimation, is 60 to 65%. The left ventricle has normal function. Wall motion difficult to assess due to poor acoustic  windows but appears grossly normal. Definity contrast agent was given IV to delineate the left ventricular endocardial borders. The left ventricular internal cavity size was normal in size. There is no left ventricular hypertrophy. Left ventricular diastolic parameters were normal. Right Ventricle: The right ventricular size is mildly enlarged. Right vetricular wall thickness was not well visualized. Right ventricular systolic function is normal. Left Atrium: Left atrial size was normal in size. Right Atrium: Right atrial size was normal in size. Pericardium: There is no evidence of pericardial effusion. Mitral Valve: The mitral valve is grossly normal. There is mild thickening of the mitral valve leaflet(s). There is mild calcification of the mitral valve leaflet(s). Mild mitral annular calcification. No evidence of mitral valve regurgitation. Tricuspid Valve: The tricuspid valve is not well visualized. Tricuspid valve regurgitation is trivial. Aortic Valve: The aortic valve was not well visualized. There is mild calcification of the aortic valve. There is mild thickening of the aortic valve. Aortic valve regurgitation is not visualized. Pulmonic Valve: The pulmonic valve was not well visualized. Pulmonic valve regurgitation is trivial. Aorta: The aortic root is normal in size and structure. IAS/Shunts: No atrial level shunt detected by color flow Doppler.  LEFT VENTRICLE PLAX 2D LVOT diam:     2.20 cm  Diastology LV SV:         97       LV e' medial:    7.07 cm/s LV SV Index:   47       LV E/e' medial:  10.7 LVOT Area:     3.80 cm LV e' lateral:   8.38 cm/s                         LV E/e' lateral: 9.0  RIGHT VENTRICLE RV S prime:     11.50 cm/s TAPSE (M-mode): 1.8 cm LEFT ATRIUM           Index       RIGHT ATRIUM           Index LA Vol (A2C):  38.8 ml 18.86 ml/m RA Area:     13.00 cm LA Vol (A4C): 57.5 ml 27.95 ml/m RA Volume:   33.20 ml  16.14 ml/m  AORTIC VALVE LVOT Vmax:   140.00 cm/s LVOT Vmean:  96.700  cm/s LVOT VTI:    0.254 m  AORTA Ao Root diam: 3.30 cm MITRAL VALVE MV Area (PHT): 2.95 cm    SHUNTS MV Decel Time: 257 msec    Systemic VTI:  0.25 m MV E velocity: 75.80 cm/s  Systemic Diam: 2.20 cm MV A velocity: 63.90 cm/s MV E/A ratio:  1.19 Laurance Flatten MD Electronically signed by Laurance Flatten MD Signature Date/Time: 02/04/2020/11:18:42 AM    Final     Medications / Allergies: per chart  Antibiotics: Anti-infectives (From admission, onward)   Start     Dose/Rate Route Frequency Ordered Stop   02/04/20 1400  anidulafungin (ERAXIS) 100 mg in sodium chloride 0.9 % 100 mL IVPB        100 mg 78 mL/hr over 100 Minutes Intravenous Every 24 hours 02/03/20 1241     02/03/20 1400  piperacillin-tazobactam (ZOSYN) IVPB 3.375 g  Status:  Discontinued        3.375 g 12.5 mL/hr over 240 Minutes Intravenous Every 8 hours 02/03/20 1237 02/03/20 1239   02/03/20 1400  anidulafungin (ERAXIS) 200 mg in sodium chloride 0.9 % 200 mL IVPB        200 mg 78 mL/hr over 200 Minutes Intravenous  Once 02/03/20 1241 02/03/20 2024   02/03/20 1400  meropenem (MERREM) 1 g in sodium chloride 0.9 % 100 mL IVPB        1 g 200 mL/hr over 30 Minutes Intravenous Every 8 hours 02/03/20 1301     02/03/20 1330  ceFEPIme (MAXIPIME) 2 g in sodium chloride 0.9 % 100 mL IVPB  Status:  Discontinued        2 g 200 mL/hr over 30 Minutes Intravenous  Once 02/03/20 1237 02/03/20 1239   02/01/20 1345  ceFAZolin (ANCEF) 2-4 GM/100ML-% IVPB       Note to Pharmacy: Sharyn Creamer   : cabinet override      02/01/20 1345 02/02/20 0159   01/25/20 0500  ceFAZolin (ANCEF) IVPB 2g/100 mL premix        2 g 200 mL/hr over 30 Minutes Intravenous Every 8 hours 01/25/20 0004 01/25/20 0537   01/24/20 2103  ceFAZolin (ANCEF) 2-4 GM/100ML-% IVPB       Note to Pharmacy: Johnette Abraham   : cabinet override      01/24/20 2103 01/24/20 2134   01/24/20 2015  ceFAZolin (ANCEF) IVPB 2g/100 mL premix        2 g 200 mL/hr over 30 Minutes  Intravenous  Once 01/24/20 2014 01/24/20 2134        Note: Portions of this report may have been transcribed using voice recognition software. Every effort was made to ensure accuracy; however, inadvertent computerized transcription errors may be present.   Any transcriptional errors that result from this process are unintentional.    Ardeth Sportsman, MD, FACS, MASCRS Gastrointestinal and Minimally Invasive Surgery  [facility] Surgery [address], [address] [place], [place] [address] [phone] Fax [phone] Main/Paging  CONTACT INFORMATION: Weekday (9AM-5PM) concerns: Call [facility] main office at [phone] Weeknight (5PM-9AM) or Weekend/Holiday concerns: Check [email or web address] for General Surgery [facility] coverage (Please, do not use SecureChat as it is not reliable communication to operating surgeons for immediate patient care)  02/05/2020  7:40 AM

## 2020-02-06 LAB — CBC
HCT: 33.7 % — ABNORMAL LOW (ref 39.0–52.0)
HCT: 34 % — ABNORMAL LOW (ref 39.0–52.0)
Hemoglobin: 10.8 g/dL — ABNORMAL LOW (ref 13.0–17.0)
Hemoglobin: 10.8 g/dL — ABNORMAL LOW (ref 13.0–17.0)
MCH: 24.5 pg — ABNORMAL LOW (ref 26.0–34.0)
MCH: 26.3 pg (ref 26.0–34.0)
MCHC: 32 g/dL (ref 30.0–36.0)
MCHC: 31.8 g/dL (ref 30.0–36.0)
MCV: 76.4 fL — ABNORMAL LOW (ref 80.0–100.0)
MCV: 82.9 fL (ref 80.0–100.0)
Platelets: 328 10*3/uL (ref 150–400)
Platelets: 324 10*3/uL (ref 150–400)
RBC: 4.41 MIL/uL (ref 4.22–5.81)
RBC: 4.1 MIL/uL — ABNORMAL LOW (ref 4.22–5.81)
RDW: 15.2 % (ref 11.5–15.5)
RDW: 16.4 % — ABNORMAL HIGH (ref 11.5–15.5)
WBC: 16.2 10*3/uL — ABNORMAL HIGH (ref 4.0–10.5)
WBC: 14.9 10*3/uL — ABNORMAL HIGH (ref 4.0–10.5)
nRBC: 0 % (ref 0.0–0.2)
nRBC: 0 % (ref 0.0–0.2)

## 2020-02-06 LAB — COMPREHENSIVE METABOLIC PANEL
ALT: 25 U/L (ref 0–44)
AST: 27 U/L (ref 15–41)
Albumin: 2 g/dL — ABNORMAL LOW (ref 3.5–5.0)
Alkaline Phosphatase: 63 U/L (ref 38–126)
Anion gap: 8 (ref 5–15)
BUN: 15 mg/dL (ref 8–23)
CO2: 27 mmol/L (ref 22–32)
Calcium: 7.8 mg/dL — ABNORMAL LOW (ref 8.9–10.3)
Chloride: 99 mmol/L (ref 98–111)
Creatinine, Ser: 0.57 mg/dL — ABNORMAL LOW (ref 0.61–1.24)
GFR, Estimated: >60 mL/min (ref >60)
Glucose, Bld: 123 mg/dL — ABNORMAL HIGH (ref 70–99)
Potassium: 4.1 mmol/L (ref 3.5–5.1)
Sodium: 134 mmol/L — ABNORMAL LOW (ref 135–145)
Total Bilirubin: 0.2 mg/dL — ABNORMAL LOW (ref 0.3–1.2)
Total Protein: 5.2 g/dL — ABNORMAL LOW (ref 6.5–8.1)

## 2020-02-06 LAB — DIFFERENTIAL
Abs Immature Granulocytes: 0.21 10*3/uL — ABNORMAL HIGH (ref 0.00–0.07)
Basophils Absolute: 0 10*3/uL (ref 0.0–0.1)
Basophils Relative: 0 %
Eosinophils Absolute: 0.2 10*3/uL (ref 0.0–0.5)
Eosinophils Relative: 1 %
Immature Granulocytes: 1 %
Lymphocytes Relative: 12 %
Lymphs Abs: 1.9 10*3/uL (ref 0.7–4.0)
Monocytes Absolute: 0.9 10*3/uL (ref 0.1–1.0)
Monocytes Relative: 5 %
Neutro Abs: 13 10*3/uL — ABNORMAL HIGH (ref 1.7–7.7)
Neutrophils Relative %: 81 %

## 2020-02-06 LAB — GLUCOSE, CAPILLARY
Glucose-Capillary: 120 mg/dL — ABNORMAL HIGH (ref 70–99)
Glucose-Capillary: 139 mg/dL — ABNORMAL HIGH (ref 70–99)
Glucose-Capillary: 117 mg/dL — ABNORMAL HIGH (ref 70–99)

## 2020-02-06 LAB — TRIGLYCERIDES: Triglycerides: 119 mg/dL (ref <150)

## 2020-02-06 LAB — MAGNESIUM: Magnesium: 2.1 mg/dL (ref 1.7–2.4)

## 2020-02-06 LAB — PREALBUMIN: Prealbumin: 13.1 mg/dL — ABNORMAL LOW (ref 18–38)

## 2020-02-06 LAB — PHOSPHORUS: Phosphorus: 2.9 mg/dL (ref 2.5–4.6)

## 2020-02-06 MED ORDER — TRAVASOL 10 % IV SOLN
INTRAVENOUS | Status: AC
  Filled 2020-02-06: qty 1080

## 2020-02-06 MED FILL — Insulin Aspart Inj 100 Unit/ML: SUBCUTANEOUS | Qty: 0.02 | Status: AC

## 2020-02-06 NOTE — Progress Notes (Addendum)
NAME:  Patrick Lam, MRN:  [id number], DOB:  May 27, 1945, LOS: 13 ADMISSION DATE:  01/24/2020, CONSULTATION DATE: 12/17 REFERRING MD: Josefina Do, PA-C CHIEF COMPLAINT:  Sepsis    Brief History:  22  yowm  Cigar smoker with chronic cough admitted 12/7 with acute right inguinal hernia s/p open repair.  Post operative course complicated by bowel obstruction requiring repeat OR visit 12/15 for lysis of dense adhesions.  Developed concern for sepsis 12/17 and transferred to ICU.  W/u with Ct chest 12/17 revealed bronchiectasis in setting of GERD and  ? H/o aspiraton  History of Present Illness:  74 y/o M who presented to [facility] on 12/7 with acute onset painful bulge in his right groin.   Prior to admit, he was in his usual state of health when he developed acute onset pain.  Initial surgical evaluation noted a large hernia that was not reducible prompting urgent OR evaluation in the hopes of avoiding bowel ischemia. He was taken to the OR on 12/7 for open inguinal hernia repair.  Operative findings notable for an incarcerated direct right inguinal hernia with approximately 6 inches of near ischemic bowel which improved with opening of the hernia. Additional findings of involvement of the right testicular cord being pulled up and edematous.  ProGrip mesh was placed into the inguinal floor.  He was returned post-operatively to the medical floor.  He developed belching and nausea on 12/10.  KUB evaluation was concerning for possible ileus. On 12/12 he continued to have nausea and emesis.  CT of the abdomen on 12/12 demonstrated diffusely dilated small bowel, fluid-filled with transition point the the RLQ and progressive consolidation of both lung bases.  On 12/14 he underwent small bowel protocol. Post protocol there were ongoing concerns for obstruction and he returned to the OR on 12/15 for laparoscopic laparotomy which found dense adhesions in the pelvis and RLQ causing obstruction.  On 12/17 the patient  developed acute onset shortness of breath, tachycardia and hypotension. He was transferred to ICU per the Rapid Response Team with concerns for septic shock.  Labs from 12/17 - K 3.4, BUN 17 / Sr Cr 0.60.  CBC pending.  He did have a mild left shift on his CBC on 12/16.  He was pan cultured, resuscitated with 68ml/kg and treated with empiric abx.  CTA chest, CT ABD/Pelvis pending.   PCCM consulted for evaluation pm 12/17   Past Medical History:  Pre-Diabetes HTN Narcolepsy, Cataplexy  Arthritis   Significant Hospital Events:  12/07 Admit with right inguinal bulge, open right inguinal hernia repair  12/15 Return to OR for lysis of adhesions, SBO  12/17 Tx to ICU, PCCM consulted for concern of sepsis  Consults:  PCCM  12/17   Procedures:    Significant Diagnostic Tests:  CT ABD/Pelvis 12/7 >> small bowel containing right inguinal hernia causing some degree of obstruction.   CT ABD/Pelvis 12/12 >> s/p right inguinal hernia repari, small bowel diffusely dilated and fluid filled with transition point in the right lower quadrant, no residual bowel involvement of the right inguinal hernia, progressive consolidation of both lung bases, new areas of nodular GGO in the lingula, RML, mild gallbladder distention, colonic diverticulosis without acute diverticulitis CTa 12/17 1. No pulmonary embolus. 2. Small bilateral pleural effusions with compressive atelectasis. The additional areas of ground-glass opacity in the lung bases on prior abdominal CT have improved in the interim. 3. Debris within the trachea with mild bronchial thickening. Subsegmental bronchial occlusion in the left lower  lobe. This may be due to mucous plugging/retained secretions or aspiration. - Echo 12/18 >>>  Mild RVE o/w nl    Micro Data:  COVID 12/7 negative  MRSA  12/17 neg  BCx2 12/17 >>    Antimicrobials:  Ancef 12/15 x one dose Meropenem 12/17 >>  Anidulafungin 12/17 >>   Scheduled Meds: . bisacodyl  10 mg  Rectal Daily  . Chlorhexidine Gluconate Cloth  6 each Topical Daily  . clomiPRAMINE  25 mg Oral TID  . enoxaparin (LOVENOX) injection  40 mg Subcutaneous Q24H  . insulin aspart  0-9 Units Subcutaneous Q8H  . lip balm  1 application Topical BID  . mouth rinse  15 mL Mouth Rinse BID  . pantoprazole (PROTONIX) IV  40 mg Intravenous Q24H   Continuous Infusions: . sodium chloride Stopped (02/04/20 0521)  . sodium chloride    . anidulafungin Stopped (02/05/20 1742)  . chlorproMAZINE (THORAZINE) IV 12.5 mg (01/31/20 1330)  . meropenem (MERREM) IV 200 mL/hr at 02/06/20 0600  . methocarbamol (ROBAXIN) IV    . sodium chloride    . TPN ADULT (ION) 90 mL/hr at 02/06/20 0600  . TPN ADULT (ION)     PRN Meds:.acetaminophen, alum & mag hydroxide-simeth, chlorproMAZINE (THORAZINE) IV, diphenhydrAMINE, enalaprilat, fentaNYL (SUBLIMAZE) injection, hydrALAZINE, labetalol, magic mouthwash, menthol-cetylpyridinium, methocarbamol (ROBAXIN) IV, methylphenidate, ondansetron **OR** ondansetron (ZOFRAN) IV, phenol, simethicone, sodium chloride flush, Sodium Oxybate     Interim History / Subjective:  Now on floor, having BM's, no cp/abd pain or sob   Objective   Blood pressure (!) 142/75, pulse 80, temperature 97.9 F (36.6 C), temperature source Oral, resp. rate 18, height 6\' 1"  (1.854 m), weight 81.6 kg, SpO2 97 %.        Intake/Output Summary (Last 24 hours) at 02/06/2020 1020 Last data filed at 02/06/2020 02/08/2020 Gross per 24 hour  Intake 2597.65 ml  Output 4500 ml  Net -1902.35 ml   Filed Weights   01/25/20 0000  Weight: 81.6 kg    Examination: Tmax 97.0 (trend def down)  Pt alert, approp nad @ 30 degrees hob on 2lpm with sats 97% No jvd Oropharynx clear,  mucosa nl Neck supple Lungs with min  rhonchi bilaterally RRR no s3 or or sign murmur Abd mod distended/ limited  excursion  Extr warm with trace edema   Neuro  Sensorium appears  intact,  no apparent motor deficits           Resolved Hospital Problem list     Assessment & Plan:   Shock, suspected Septic. Rule Out Obstructive.  -  Echo ok 12/18  - resolved 12/19 and tx to floor   Open Right Inguinal Hernia Repair  Post-operative SBO  Initial surgical intervention on 12/7 with open right inguinal hernia repair. Returned to the OR on 12/15 for lysis of adhesions in the setting of SBO. At risk for bowel injury.   -post operative care per CCS  -NPO / continue NG suction  per CCS     Acute Hypoxemic Respiratory Failure  Suspect in setting of bibasilar atelectasis / splinting post abd surgery, possible aspiration PNA with prior vomiting - cta chest 12/17 reviewed ? Suggests recurrent asp ?  >> titrate 02 to sats > 90%   We can see at discharge if need to arrange pulmonary f/u and outpt 02   Anemia post op  Lab Results  Component Value Date   HGB 10.8 (L) 02/06/2020   HGB 12.7 (L) 02/03/2020   HGB  12.9 (L) 02/02/2020    On lovenox and protonix  So >>>  recheck 6 pm and d/c lovenox if trending down    Hypokalemia  Hypophosphatemia  -monitor, replace as indicated   Hyperglycemia / DM -SSI, sensitive scale  -holding  metformin    At Risk Malnutrition  -TPN per pharmacy   Hx Narcolepsy  -continue PTA medications   Hx HTN -holding  home cardizem, losartan, diuretics  Best practice (evaluated daily)  Diet: NPO  Pain/Anxiety/Delirium protocol (if indicated): pain control per CCS  VAP protocol (if indicated): n/a  DVT prophylaxis: lovenox  GI prophylaxis: n/a  Glucose control: SSI Mobility: as tolerated > push as much as he'll tolerate  Disposition:  Floor ? Will need snf short term?       Labs   CBC: Recent Labs  Lab 02/01/20 0603 02/02/20 0515 02/03/20 1245 02/06/20 0420  WBC 9.6 13.6* 12.0* 16.2*  NEUTROABS  --  10.7* 11.2* 13.0*  HGB 13.0 12.9* 12.7* 10.8*  HCT 39.8 40.2 38.8* 33.7*  MCV 75.4* 76.1* 75.8* 76.4*  PLT 338 358 334 328    Basic Metabolic Panel: Recent  Labs  Lab 02/02/20 0512 02/03/20 0425 02/03/20 1245 02/04/20 0520 02/05/20 0449 02/06/20 0420  NA 138 136 138 139 138 134*  K 4.0 3.4* 3.2* 3.8 3.6 4.1  CL 103 100 104 106 104 99  CO2 25 27 22 24 26 27   GLUCOSE 98 145* 95 137* 145* 123*  BUN 22 17 22 19 19 15   CREATININE 0.69 0.60* 0.93 0.66 0.53* 0.57*  CALCIUM 8.2* 8.0* 7.7* 7.6* 7.7* 7.8*  MG 2.0 2.0  --  1.7 2.2 2.1  PHOS 3.7 2.5  --  3.4 2.8 2.9   GFR: Estimated Creatinine Clearance: 88.8 mL/min (A) (by C-G formula based on SCr of 0.57 mg/dL (L)). Recent Labs  Lab 02/01/20 0603 02/02/20 0515 02/03/20 1217 02/03/20 1245 02/03/20 1308 02/03/20 1900 02/04/20 0520 02/05/20 0449 02/06/20 0420  PROCALCITON  --   --   --  3.11  --   --  33.44 17.88  --   WBC 9.6 13.6*  --  12.0*  --   --   --   --  16.2*  LATICACIDVEN  --   --  4.0*  --  4.1* 1.9  --   --   --     Liver Function Tests: Recent Labs  Lab 02/02/20 0512 02/06/20 0420  AST 23 27  ALT 23 25  ALKPHOS 49 63  BILITOT 0.7 0.2*  PROT 5.5* 5.2*  ALBUMIN 2.4* 2.0*   No results for input(s): LIPASE, AMYLASE in the last 168 hours. No results for input(s): AMMONIA in the last 168 hours.  ABG    Component Value Date/Time   PHART 7.490 (H) 02/03/2020 1302   PCO2ART 28.7 (L) 02/03/2020 1302   PO2ART 55.2 (L) 02/03/2020 1302   HCO3 21.7 02/03/2020 1302   TCO2 27 01/24/2020 1935   ACIDBASEDEF 0.3 02/03/2020 1302   O2SAT 89.9 02/03/2020 1302     Coagulation Profile: Recent Labs  Lab 02/03/20 1245  INR 1.6*    Cardiac Enzymes: No results for input(s): CKTOTAL, CKMB, CKMBINDEX, TROPONINI in the last 168 hours.  HbA1C: Hgb A1c MFr Bld  Date/Time Value Ref Range Status  01/30/2020 05:02 AM 5.8 (H) 4.8 - 5.6 % Final    Comment:    (NOTE) Pre diabetes:          5.7%-6.4%  Diabetes:              >  6.4%  Glycemic control for   <7.0% adults with diabetes   01/26/2018 02:12 PM 6.0 (H) 4.8 - 5.6 % Final    Comment:    (NOTE) Pre diabetes:           5.7%-6.4% Diabetes:              >6.4% Glycemic control for   <7.0% adults with diabetes     CBG: Recent Labs  Lab 02/05/20 0014 02/05/20 0610 02/05/20 1424 02/05/20 2203 02/06/20 0544  GLUCAP 155* 149* 169* 127* 120*     If has further non-ICU medical needs on floor please consult [facility] - PCCM can see prn   Sandrea Hughs, MD Pulmonary and Critical Care Medicine [facility] Cell [phone]   After 7:00 pm call Elink  [phone]

## 2020-02-06 NOTE — Progress Notes (Signed)
Physical Therapy Treatment Patient Details Name: Patrick Lam MRN: [id number] DOB: 1946/01/04 Today's Date: 02/06/2020    History of Present Illness Patient is 74 y.o. male who present to [facility] on 01/24/20 with c/o abdominal pain around his bellybutton that started to radiate to his right groin and testicles. Imaging in ED revealed large incarcerated Rt inguinal hernia. Patient now s/p open Rt inguinal hernia repair for incarcerated RIH on 01/24/20 and dx lap with LOA for SBO on 02/01/20. PMH significant for HTN, DM, OA, narcolepsy, and Lt TKA in 2019.    PT Comments    Patient progressing with acute PT and advanced to gait training today with RW. Pt reliant on external support for balance and due to decreased strength/activity tolerance. Patient requires min guard to assist for transfers and gait with RW; pt now on 3L/min and saturating at 91-92% with gait. Initiated functional LE strengthening and pt required extended rest between reps due to fatigue and SOB. Sats 93% and HR max f 111 bpm. Encouraged pt to continue mobilizing with RN/NT staff to improve mobility. Anticipate pt with progress well with acute PT and could discharge with HHPT and assist from family. Will continue to progress as able.   Follow Up Recommendations  SNF;Home health PT (Snf vs HHPT with assist from family, pending progress with mobility)     Equipment Recommendations  None recommended by PT    Recommendations for Other Services       Precautions / Restrictions Precautions Precautions: Fall Restrictions Weight Bearing Restrictions: No    Mobility  Bed Mobility Overal bed mobility: Needs Assistance Bed Mobility: Supine to Sit     Supine to sit: Supervision;HOB elevated     General bed mobility comments: cues for pt to use bed rails, no assist to sit to EOB or scoot forward, pt taking extra time  Transfers Overall transfer level: Needs assistance Equipment used: Rolling walker (2 wheeled) Transfers: Sit  to/from Stand Sit to Stand: Min guard         General transfer comment: cues for safe hand placement, pt initaited power up with bil UE on EOB, min guard for safety with hand transition to RW.  Ambulation/Gait Ambulation/Gait assistance: Min guard Gait Distance (Feet): 220 Feet Assistive device: Rolling walker (2 wheeled) Gait Pattern/deviations: Step-through pattern;Decreased stride length Gait velocity: decr   General Gait Details: Assist for IV pole, pt using RW, min guard for safety. pt maintained safe proximity throughout. Pt on 3L/min and SpO2 91-92%. HR max of 111 bpm. no overt LOB.   Stairs             Wheelchair Mobility    Modified Rankin (Stroke Patients Only)       Balance Overall balance assessment: Needs assistance Sitting-balance support: Feet supported Sitting balance-Leahy Scale: Good     Standing balance support: During functional activity;Bilateral upper extremity supported Standing balance-Leahy Scale: Fair                              Cognition Arousal/Alertness: Awake/alert Behavior During Therapy: WFL for tasks assessed/performed Overall Cognitive Status: Within Functional Limits for tasks assessed                                        Exercises Other Exercises Other Exercises: 4 reps Sit<>Stand from recliner; pt using bil UE for power up  but no UE's for slow controlled descent to chair to challenge LE strength.    General Comments        Pertinent Vitals/Pain Pain Assessment: No/denies pain Pain Intervention(s): Monitored during session    Home Living                      Prior Function            PT Goals (current goals can now be found in the care plan section) Acute Rehab PT Goals Patient Stated Goal: get strength back PT Goal Formulation: With patient Time For Goal Achievement: 02/17/20 Potential to Achieve Goals: Good Progress towards PT goals: Progressing toward goals     Frequency    Min 3X/week      PT Plan Current plan remains appropriate    Co-evaluation              AM-PAC PT "6 Clicks" Mobility   Outcome Measure  Help needed turning from your back to your side while in a flat bed without using bedrails?: A Little Help needed moving from lying on your back to sitting on the side of a flat bed without using bedrails?: A Little Help needed moving to and from a bed to a chair (including a wheelchair)?: A Little Help needed standing up from a chair using your arms (e.g., wheelchair or bedside chair)?: A Little Help needed to walk in hospital room?: A Little Help needed climbing 3-5 steps with a railing? : A Lot 6 Click Score: 17    End of Session Equipment Utilized During Treatment: Gait belt;Oxygen Activity Tolerance: Patient tolerated treatment well Patient left: in chair;with call bell/phone within reach Nurse Communication: Mobility status PT Visit Diagnosis: Other abnormalities of gait and mobility (R26.89);Muscle weakness (generalized) (M62.81);Difficulty in walking, not elsewhere classified (R26.2)     Time: 2376-2831 PT Time Calculation (min) (ACUTE ONLY): 31 min  Charges:  $Gait Training: 8-22 mins $Therapeutic Exercise: 8-22 mins                     Wynn Maudlin, DPT Acute Rehabilitation Services Office [phone] Pager [phone]     Anitra Lauth 02/06/2020, 3:17 PM

## 2020-02-06 NOTE — Progress Notes (Signed)
5 Days Post-Op LOA  Subjective/Chief Complaint: feels better.  Had one BM but no further flatus  Objective: Vital signs in last 24 hours: Temp:  [97.5 F (36.4 C)-97.9 F (36.6 C)] 97.9 F (36.6 C) (12/20 0524) Pulse Rate:  [77-91] 80 (12/20 0524) Resp:  [14-28] 18 (12/20 0524) BP: (142-190)/(66-76) 142/75 (12/20 0524) SpO2:  [97 %-100 %] 97 % (12/20 0524) Last BM Date: 02/05/20  Intake/Output from previous day: 12/19 0701 - 12/20 0700 In: 3313.7 [P.O.:60; I.V.:2386.6; IV Piggyback:867] Out: 4480 [Urine:2980; Emesis/NG output:1500] Intake/Output this shift: Total I/O In: 60 [P.O.:60] Out: 800 [Emesis/NG output:800]   Abd: soft,  less distended NGT bilious but lighter    Incisions c/d/i, ecchymosis stable in right inguinal region port sites CDI  Lab Results:  Recent Labs    02/03/20 1245 02/06/20 0420  WBC 12.0* 16.2*  HGB 12.7* 10.8*  HCT 38.8* 33.7*  PLT 334 328   BMET Recent Labs    02/05/20 0449 02/06/20 0420  NA 138 134*  K 3.6 4.1  CL 104 99  CO2 26 27  GLUCOSE 145* 123*  BUN 19 15  CREATININE 0.53* 0.57*  CALCIUM 7.7* 7.8*   PT/INR Recent Labs    02/03/20 1245  LABPROT 18.4*  INR 1.6*   ABG Recent Labs    02/03/20 1302  PHART 7.490*  HCO3 21.7    Studies/Results: No results found.  Anti-infectives: Anti-infectives (From admission, onward)   Start     Dose/Rate Route Frequency Ordered Stop   02/04/20 1400  anidulafungin (ERAXIS) 100 mg in sodium chloride 0.9 % 100 mL IVPB        100 mg 78 mL/hr over 100 Minutes Intravenous Every 24 hours 02/03/20 1241     02/03/20 1400  piperacillin-tazobactam (ZOSYN) IVPB 3.375 g  Status:  Discontinued        3.375 g 12.5 mL/hr over 240 Minutes Intravenous Every 8 hours 02/03/20 1237 02/03/20 1239   02/03/20 1400  anidulafungin (ERAXIS) 200 mg in sodium chloride 0.9 % 200 mL IVPB        200 mg 78 mL/hr over 200 Minutes Intravenous  Once 02/03/20 1241 02/03/20 2024   02/03/20 1400  meropenem  (MERREM) 1 g in sodium chloride 0.9 % 100 mL IVPB        1 g 200 mL/hr over 30 Minutes Intravenous Every 8 hours 02/03/20 1301     02/03/20 1330  ceFEPIme (MAXIPIME) 2 g in sodium chloride 0.9 % 100 mL IVPB  Status:  Discontinued        2 g 200 mL/hr over 30 Minutes Intravenous  Once 02/03/20 1237 02/03/20 1239   02/01/20 1345  ceFAZolin (ANCEF) 2-4 GM/100ML-% IVPB       Note to Pharmacy: Sharyn Creamer   : cabinet override      02/01/20 1345 02/02/20 0159   01/25/20 0500  ceFAZolin (ANCEF) IVPB 2g/100 mL premix        2 g 200 mL/hr over 30 Minutes Intravenous Every 8 hours 01/25/20 0004 01/25/20 0537   01/24/20 2103  ceFAZolin (ANCEF) 2-4 GM/100ML-% IVPB       Note to Pharmacy: Johnette Abraham   : cabinet override      01/24/20 2103 01/24/20 2134   01/24/20 2015  ceFAZolin (ANCEF) IVPB 2g/100 mL premix        2 g 200 mL/hr over 30 Minutes Intravenous  Once 01/24/20 2014 01/24/20 2134      Assessment/Plan: DM - SSI, CBD checks  HTN - will need to hold home meds due to NGT placement, will adjust to IV meds Urinary retention - resolved  POD12,5, s/popen rih repair for incarcerated RIHMagnus Lam, dx lap with LOA for SBO FB 12/15 -PICC/TNA -cont NGT and await resolution of ileus -mobilize TID -pulm toilet/IS -multi-modal pain control -PT eval   FEN -NPO/IVFs/NGT/TNA VTE -lovenox ID -none currently needed Dispo - await ileus resolution Normal lactate  CT show no bowel injury/abscess and contrast in the colon   LOS: 13 days    Vanita Panda MD  02/06/2020

## 2020-02-06 NOTE — Progress Notes (Signed)
Patient complains of chest pain , vital signs was checked and as follows  Temp. 97.9  BP- 143/80 Pulse rate- 84 RR- 16 O2- 97% @ 3L O2 Pain level 6/10 , patient was given pain medicine we will continue to monitor.

## 2020-02-06 NOTE — TOC Initial Note (Signed)
Transition of Care [facility]) - Initial/Assessment Note    Patient Details  Name: Patrick Lam MRN: [id number] Date of Birth: 1945/07/16  [facility] CM/SW Contact:    Lennart Pall, LCSW Phone Number: 02/06/2020, 3:05 PM  Clinical Narrative:                 Met with pt and spoke with daughter via phone today to discuss dc needs/plans.  Both report that pt does live alone and was independent PTA.  They are very concerned about pt's current level of debility and are agreed that short term rehab prior to home return would be best option.  Pt is agreeable with this plan.  Daughter to look into area facilities.  Pt not yet medically ready for dc, however, will begin SNF bed search process.  Pt has received COVID vaccinations.  Expected Discharge Plan: Skilled Nursing Facility Barriers to Discharge: Continued Medical Work up   Patient Goals and CMS Choice Patient states their goals for this hospitalization and ongoing recovery are:: to eventually be able to return home - realistic that rehab will likely be needed first CMS Medicare.gov Compare Post Acute Care list provided to:: Patient Choice offered to / list presented to : Patient  Expected Discharge Plan and Services Expected Discharge Plan: [place] In-house Referral: Clinical Social Work   Post Acute Care Choice: [place] Living arrangements for the past 2 months: [place]                                      Prior Living Arrangements/Services Living arrangements for the past 2 months: Single Family Home Lives with:: Self Patient language and need for interpreter reviewed:: Yes Do you feel safe going back to the place where you live?: Yes   but, lives alone and currently would need 24/7 assistance.  Need for Family Participation in Patient Care: Yes (Comment) Care giver support system in place?: No (comment)   Criminal Activity/Legal Involvement Pertinent to Current  Situation/Hospitalization: No - Comment as needed  Activities of Daily Living Home Assistive Devices/Equipment: Eyeglasses ADL Screening (condition at time of admission) Patient's cognitive ability adequate to safely complete daily activities?: Yes Is the patient deaf or have difficulty hearing?: No Does the patient have difficulty seeing, even when wearing glasses/contacts?: No Does the patient have difficulty concentrating, remembering, or making decisions?: No Patient able to express need for assistance with ADLs?: Yes Does the patient have difficulty dressing or bathing?: No Independently performs ADLs?: Yes (appropriate for developmental age) Does the patient have difficulty walking or climbing stairs?: No Weakness of Legs: None Weakness of Arms/Hands: None  Permission Sought/Granted Permission sought to share information with : Family Supports Permission granted to share information with : Yes, Verbal Permission Granted  Share Information with NAME: Sandra Tellefsen     Permission granted to share info w Relationship: daughter  Permission granted to share info w Contact Information: [phone]  Emotional Assessment Appearance:: Appears stated age Attitude/Demeanor/Rapport: Engaged,Gracious Affect (typically observed): Accepting,Quiet Orientation: : Oriented to Self,Oriented to Place,Oriented to  Time,Oriented to Situation Alcohol / Substance Use: Not Applicable Psych Involvement: No (comment)  Admission diagnosis:  Incarcerated inguinal hernia [K40.30] Incarcerated right inguinal hernia [K40.30] Patient Active Problem List   Diagnosis Date Noted   Acute respiratory failure ([place]) 02/05/2020   Shock circulatory ([place]) 02/05/2020   Incarcerated right inguinal hernia 01/24/2020  Overweight (BMI 25.0-29.9) 02/03/2018   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   PCP:  Cathlean Sauer, MD Pharmacy:   [id number] [place] [place] - [address] [place] [address]Phone: [phone]: [phone]    Social Determinants of Health (SDOH) Interventions    Readmission Risk Interventions Readmission Risk Prevention Plan 02/06/2020  Transportation Screening Complete  PCP or Specialist Appt within 5-7 Days Complete  Home Care Screening Complete

## 2020-02-06 NOTE — Progress Notes (Signed)
PHARMACY - TOTAL PARENTERAL NUTRITION CONSULT NOTE   Indication: Bowel obstruction  Patient Measurements: Height: 6\' 1"  (185.4 cm) Weight: 81.6 kg (179 lb 14.3 oz) IBW/kg (Calculated) : 79.9 TPN AdjBW (KG): 81.6 Body mass index is 23.73 kg/m.  Assessment: Patient is a 74 y.o M presented to the ED on 12/7 with abdominal pain.  Abd CT showed obstruction secondary to right inguinal hernia.  He underwent hernia repair on 12/7.  He subsequently developed n/v s/p procedure, placed on NPO, and NG placed on 12/13. Pharmacy consulted on 12/15 to dose TPN.  Glucose / Insulin: CBG range 120 - 169 with sSSI q 6h.  Total of 3 units SSI in 24 hours  - PTA metformin Electrolytes:  Na 134, low.  Others WNL including K 4.1 (trending up), CorrCa 9.4, Mag, and Phos.  Goal K > 2, mag > 2 Renal: scr <1, BUN wnl LFTs / TGs: LFTs wnl (12/20), 12/16 triglycerides 119 (12/20) Prealbumin / albumin: prealbumin increased to 13.1 (12/20); Albumin 2 Intake / Output; MIVF: I/O: UOP 08-24-1968; NG output decreased to 1500 ml; NS @ KVO.  Unmeasured stool x1 occurrence GI Imaging: - 12/7 abd 14/7 bowel containing right inguinal hernia causing some degree of obstruction - 12/12 abd CT:  Small bowel diffusely dilated and fluid-filled with transition point in the right lower quadrant. Differential considerations include postoperative ileus or obstruction, the presence of a transition point fevers obstruction. 12/17 abd CT: No evidence of bowel injury. Some proximal bowel dilatation is likely related to a degree of postoperative ileus. Enteric contrast reaches the colon. Free air is expected 2 days post intra-abdominal surgery.Segment of wall thickening involving the descending colon which is nonspecific.Colonic diverticulosis without diverticulitis.Sequela of recent right inguinal Surgeries / Procedures:  -12/7: repair of inguinal incarcerated hernia with mesh - 12/13: NG placement - 12/15: ex lap, LOA  Central access:  PICC double lumen (12/16) TPN start date: 12/16  Nutritional Goals:  Dietician  Recommendations  (On 12/16 ) Kcal:  2050-2250, Protein:  105-115g, Fluid:  2L/day  TPN at target rate of 90 ml/hr provides: 2073 kcal, 108 grams of protein daily.  Current Nutrition:  TPN NGT to suction  Plan:  At 1800:  Continue TPN at goal rate of 90 mL/hr at 1800  Continue current electrolytes  Electrolytes in TPN:   - Increase to 75 mEq/L of Na  - Continue: 55 mEq/L of K, 49mEq/L of Ca, 7.5 mEq/L of Mg, and 20 mmol/L of Phos. Cl:Ac ratio 1:1  Add standard MVI and trace elements to TPN  Reduce sSSI to q 8 hours  Continue MIVF at [facility]  Monitor TPN labs on Mon/Thurs, BMET tomorrow.   [facility] PharmD, BCPS Clinical Pharmacist [facility] main pharmacy [phone] 02/06/2020 7:49 AM

## 2020-02-07 ENCOUNTER — Inpatient Hospital Stay (HOSPITAL_COMMUNITY): Payer: Medicare Other

## 2020-02-07 ENCOUNTER — Encounter (HOSPITAL_COMMUNITY): Payer: Self-pay | Admitting: Surgery

## 2020-02-07 LAB — BASIC METABOLIC PANEL
Anion gap: 8 (ref 5–15)
BUN: 14 mg/dL (ref 8–23)
CO2: 29 mmol/L (ref 22–32)
Calcium: 8.1 mg/dL — ABNORMAL LOW (ref 8.9–10.3)
Chloride: 96 mmol/L — ABNORMAL LOW (ref 98–111)
Creatinine, Ser: 0.46 mg/dL — ABNORMAL LOW (ref 0.61–1.24)
GFR, Estimated: >60 mL/min (ref >60)
Glucose, Bld: 108 mg/dL — ABNORMAL HIGH (ref 70–99)
Potassium: 4.3 mmol/L (ref 3.5–5.1)
Sodium: 133 mmol/L — ABNORMAL LOW (ref 135–145)

## 2020-02-07 LAB — GLUCOSE, CAPILLARY
Glucose-Capillary: 125 mg/dL — ABNORMAL HIGH (ref 70–99)
Glucose-Capillary: 124 mg/dL — ABNORMAL HIGH (ref 70–99)
Glucose-Capillary: 135 mg/dL — ABNORMAL HIGH (ref 70–99)

## 2020-02-07 MED ORDER — TRAVASOL 10 % IV SOLN
INTRAVENOUS | Status: AC
  Filled 2020-02-07: qty 1080

## 2020-02-07 MED ORDER — TAMSULOSIN HCL 0.4 MG PO CAPS
0.4000 mg | ORAL_CAPSULE | Freq: Every day | ORAL | Status: DC
  Administered 2020-02-07 – 2020-02-14 (×8): 0.4 mg via ORAL
  Filled 2020-02-07 (×8): qty 1

## 2020-02-07 MED ORDER — TRAVASOL 10 % IV SOLN: INTRAVENOUS | Status: DC

## 2020-02-07 NOTE — Progress Notes (Signed)
PHARMACY - TOTAL PARENTERAL NUTRITION CONSULT NOTE   Indication: Bowel obstruction  Patient Measurements: Height: 6\' 1"  (185.4 cm) Weight: 81.6 kg (179 lb 14.3 oz) IBW/kg (Calculated) : 79.9 TPN AdjBW (KG): 81.6 Body mass index is 23.73 kg/m.  Assessment: Patient is a 74 y.o M presented to the ED on 12/7 with abdominal pain.  Abd CT showed obstruction secondary to right inguinal hernia.  He underwent hernia repair on 12/7.  He subsequently developed n/v s/p procedure, placed on NPO, and NG placed on 12/13. Pharmacy consulted on 12/15 to dose TPN.  Glucose / Insulin: CBG range 117-139 with sSSI q 6h.  Total of 2 units SSI in 24 hours  - PTA metformin held Electrolytes:  Na decreased further to 133.  Others WNL including K 4.3 (trending up), CorrCa 9.4, Mag, and Phos.  Goal K > 2, mag > 2 Renal: Scr <1, BUN wnl LFTs / TGs: LFTs wnl (12/20), triglycerides up to 119 (12/20) Prealbumin / albumin: prealbumin increased to 13.1 (12/20); Albumin 2 Intake / Output; MIVF: I/O: UOP 08-24-1968; NG output 1500 ml; NS @ KVO.  Unmeasured stool x1 occurrence.  PO intake 480 mL GI Imaging: - 12/7 abd 14/7 bowel containing right inguinal hernia causing some degree of obstruction - 12/12 abd CT:  Small bowel diffusely dilated and fluid-filled with transition point in the right lower quadrant. Differential considerations include postoperative ileus or obstruction, the presence of a transition point fevers obstruction. 12/17 abd CT: No evidence of bowel injury. Some proximal bowel dilatation is likely related to a degree of postoperative ileus. Enteric contrast reaches the colon. Free air is expected 2 days post intra-abdominal surgery.Segment of wall thickening involving the descending colon which is nonspecific.Colonic diverticulosis without diverticulitis.Sequela of recent right inguinal Surgeries / Procedures:  -12/7: repair of inguinal incarcerated hernia with mesh - 12/13: NG placement - 12/15: ex lap,  LOA  Central access: PICC double lumen (12/16) TPN start date: 12/16  Nutritional Goals:  Dietician  Recommendations  (On 12/16 ) Kcal:  2050-2250, Protein:  105-115g, Fluid:  2L/day  TPN at target rate of 90 ml/hr provides: 2073 kcal, 108 grams of protein daily.  Current Nutrition:  TPN NGT to low intermittent suction  Plan:  At 1800:  Continue TPN at goal rate of 90 mL/hr at 1800  Electrolytes in TPN:   - Increase to 100 mEq/L of Na, Decrease to 45 mEq/L of K  - Continue 60mEq/L of Ca, 7.5 mEq/L of Mg, and 20 mmol/L of Phos.    - Change to Cl:Ac ratio 2:1  Add standard MVI and trace elements to TPN  Reduce sSSI to q 8 hours  Continue [facility] at [facility]  Monitor TPN labs on Mon/Thurs, BMET, Mag Phos  tomorrow.   [facility] PharmD, BCPS Clinical Pharmacist [facility] main pharmacy [phone] 02/07/2020 7:10 AM

## 2020-02-07 NOTE — Progress Notes (Addendum)
6 Days Post-Op    CC: Abdominal pain  Subjective: Patient sleeping soundly when I came into the room.  NG in place with 1500 out yesterday.  Did have a BM yesterday the day prior.  Does not sound like he is having a lot of flatus.  He does have bowel sounds.  Port sites all look good, hernia repair site looks fine, some ecchymosis but otherwise no issues.  Objective: Vital signs in last 24 hours: Temp:  [97.5 F (36.4 C)-98.2 F (36.8 C)] 98.1 F (36.7 C) (12/21 0600) Pulse Rate:  [82-86] 86 (12/21 0600) Resp:  [13-16] 16 (12/21 0600) BP: (139-185)/(74-80) 144/75 (12/21 0600) SpO2:  [96 %-100 %] 96 % (12/21 0600) Last BM Date: 02/06/20 480 p.o. 2400 IV 5075 urine 1500 mg BM x1 Afebrile vital signs are stable Sodium 133, potassium 4.3 Intake/Output from previous day: 12/20 0701 - 12/21 0700 In: 2858.2 [P.O.:480; I.V.:1933.7; IV Piggyback:444.6] Out: 6775 [Urine:5275; Emesis/NG output:1500] Intake/Output this shift: No intake/output data recorded.  General appearance: alert, cooperative and no distress Resp: clear to auscultation bilaterally GI: Soft positive bowel sounds  Lab Results:  Recent Labs    02/06/20 0420 02/06/20 1813  WBC 16.2* 14.9*  HGB 10.8* 10.8*  HCT 33.7* 34.0*  PLT 328 324    BMET Recent Labs    02/06/20 0420 02/07/20 0304  NA 134* 133*  K 4.1 4.3  CL 99 96*  CO2 27 29  GLUCOSE 123* 108*  BUN 15 14  CREATININE 0.57* 0.46*  CALCIUM 7.8* 8.1*   PT/INR No results for input(s): LABPROT, INR in the last 72 hours.  Recent Labs  Lab 02/02/20 0512 02/06/20 0420  AST 23 27  ALT 23 25  ALKPHOS 49 63  BILITOT 0.7 0.2*  PROT 5.5* 5.2*  ALBUMIN 2.4* 2.0*     Lipase  No results found for: LIPASE   Medications: . bisacodyl  10 mg Rectal Daily  . Chlorhexidine Gluconate Cloth  6 each Topical Daily  . clomiPRAMINE  25 mg Oral TID  . enoxaparin (LOVENOX) injection  40 mg Subcutaneous Q24H  . insulin aspart  0-9 Units Subcutaneous Q8H   . lip balm  1 application Topical BID  . mouth rinse  15 mL Mouth Rinse BID  . pantoprazole (PROTONIX) IV  40 mg Intravenous Q24H   . sodium chloride Stopped (02/04/20 0521)  . sodium chloride    . anidulafungin 100 mg (02/06/20 1418)  . chlorproMAZINE (THORAZINE) IV 12.5 mg (01/31/20 1330)  . meropenem (MERREM) IV 1 g (02/07/20 0644)  . methocarbamol (ROBAXIN) IV 1,000 mg (02/06/20 1352)  . sodium chloride    . TPN ADULT (ION) 90 mL/hr at 02/06/20 1719  . TPN ADULT (ION)      Assessment/Plan Sepsis/acute hypoxic respiratory failure Narcolepsy Type 2 diabetes Hypertension Hx urinary retention Hx tobacco use Hypokalemia/hypophosphatemia Moderate malnutrition - on TNA   Repair incarcerated right inguinal hernia with mesh 01/24/2020 Dr. Silvano Bilis, POD #14 Laparoscopic lysis of adhesions x90 minutes 02/01/2020, Dr. Almond Lint POD #6  FEN: TPN/n.p.o. ID:   anidulafungin 12/17 >> day 5;  meropenem 12/17>> day 5 DVT: Lovenox Follow-up: Dr. Magnus Ivan  Plan: We will continue the NG today, he has 480 p.o. recorded but he had 1500 recorded yesterday.  He was clamped when I came in and dropped another 200 right away.  The drainage does not look like ice it looks like gastric fluid. He just got up to bedside yesterday.  He does  have PT consult they are recommending SNF versus home health with PT of the family can handle him at home later. Work on Mobility today and hopefully clamping trials tomorrow.  He still has his foley in.  Add Flomax and decide on removing foley later.  Film not officially read yet, but NG in stomach, he has some old contrast in his right colon.  Ongoing SB dilatation.   LOS: 14 days    Sonja Manseau 02/07/2020 Please see Amion

## 2020-02-07 NOTE — Progress Notes (Signed)
Physical Therapy Treatment Patient Details Name: Patrick Lam MRN: [id number] DOB: 10-Feb-1946 Today's Date: 02/07/2020    History of Present Illness Patient is 74 y.o. male who present to [facility] on 01/24/20 with c/o abdominal pain around his bellybutton that started to radiate to his right groin and testicles. Imaging in ED revealed large incarcerated Rt inguinal hernia. Patient now s/p open Rt inguinal hernia repair for incarcerated RIH on 01/24/20 and dx lap with LOA for SBO on 02/01/20. PMH significant for HTN, DM, OA, narcolepsy, and Lt TKA in 2019.    PT Comments    Pt in bed on 2 lts nasal at 97%.  Assisted OOB to amb.  General Gait Details: remained on 2 lts nasal avg sats 96% used RW for safety slow but steady gait.  Mild c/o weakness other wise "feels good to walk". Assisted back to bed as pt was in recliner earlier today.    Follow Up Recommendations  SNF;Home health PT (pending progress and family support)     Equipment Recommendations  None recommended by PT    Recommendations for Other Services       Precautions / Restrictions Precautions Precautions: Fall Precaution Comments: NPO x ice chips, NG tube Restrictions Weight Bearing Restrictions: No    Mobility  Bed Mobility Overal bed mobility: Needs Assistance Bed Mobility: Supine to Sit;Sit to Supine     Supine to sit: Supervision;HOB elevated Sit to supine: Min assist   General bed mobility comments: required increased assist back to bed b LE  Transfers Overall transfer level: Needs assistance Equipment used: Rolling walker (2 wheeled) Transfers: Sit to/from [employer] Sit to Stand: Supervision Stand pivot transfers: Supervision;Min guard       General transfer comment: from elevated surface with increased time and VC's on safety with turns/target bed  Ambulation/Gait Ambulation/Gait assistance: Supervision;Min guard Gait Distance (Feet): 185 Feet Assistive device: Rolling walker (2  wheeled) Gait Pattern/deviations: Step-through pattern;Decreased stride length Gait velocity: decr   General Gait Details: remained on 2 lts nasal avg sats 96% used RW for safety slow but steady gait.  Mild c/o weakness other wise "feels good to walk".   Stairs             Wheelchair Mobility    Modified Rankin (Stroke Patients Only)       Balance                                            Cognition Arousal/Alertness: Awake/alert Behavior During Therapy: WFL for tasks assessed/performed Overall Cognitive Status: Within Functional Limits for tasks assessed                                 General Comments: AxO x 3 pleasant      Exercises      General Comments        Pertinent Vitals/Pain Pain Assessment: Faces Faces Pain Scale: Hurts a little bit Pain Location: ABD surgical Pain Descriptors / Indicators: Tender;Discomfort Pain Intervention(s): Monitored during session    Home Living                      Prior Function            PT Goals (current goals can now be found in the care plan section) Progress  towards PT goals: Progressing toward goals    Frequency    Min 3X/week      PT Plan Current plan remains appropriate    Co-evaluation              AM-PAC PT "6 Clicks" Mobility   Outcome Measure  Help needed turning from your back to your side while in a flat bed without using bedrails?: A Little Help needed moving from lying on your back to sitting on the side of a flat bed without using bedrails?: A Little Help needed moving to and from a bed to a chair (including a wheelchair)?: A Little Help needed standing up from a chair using your arms (e.g., wheelchair or bedside chair)?: A Little Help needed to walk in hospital room?: A Little Help needed climbing 3-5 steps with a railing? : A Lot 6 Click Score: 17    End of Session Equipment Utilized During Treatment: Gait belt;Oxygen Activity  Tolerance: Patient tolerated treatment well Patient left: in bed;with call bell/phone within reach;with bed alarm set Nurse Communication: Mobility status PT Visit Diagnosis: Other abnormalities of gait and mobility (R26.89);Muscle weakness (generalized) (M62.81);Difficulty in walking, not elsewhere classified (R26.2)     Time: 0626-9485 PT Time Calculation (min) (ACUTE ONLY): 28 min  Charges:  $Gait Training: 8-22 mins $Therapeutic Activity: 8-22 mins                     Felecia Shelling  PTA Acute  Rehabilitation Services Pager      [phone] Office      [phone]

## 2020-02-07 NOTE — Progress Notes (Signed)
Pharmacy Antibiotic Note  Patrick Lam is a 74 y.o. male admitted on 01/24/2020 with incarcerated hernia s/p repair. Rapid response called 12/17 and patient transferred to ICU on pressors. Pharmacy has been consulted for meropenem dosing for sepsis.  Plan: Meropenem 1 g IV q8 hours Will f/u renal function, culture results, and clinical course  Height: 6\' 1"  (185.4 cm) Weight: 81.6 kg (179 lb 14.3 oz) IBW/kg (Calculated) : 79.9  Temp (24hrs), Avg:97.9 F (36.6 C), Min:97.5 F (36.4 C), Max:98.2 F (36.8 C)  Recent Labs  Lab 02/01/20 0603 02/02/20 0512 02/02/20 0515 02/03/20 0425 02/03/20 1217 02/03/20 1245 02/03/20 1308 02/03/20 1900 02/04/20 0520 02/05/20 0449 02/06/20 0420 02/06/20 1813 02/07/20 0304  WBC 9.6  --  13.6*  --   --  12.0*  --   --   --   --  16.2* 14.9*  --   CREATININE 0.75   < >  --    < >  --  0.93  --   --  0.66 0.53* 0.57*  --  0.46*  LATICACIDVEN  --   --   --   --  4.0*  --  4.1* 1.9  --   --   --   --   --    < > = values in this interval not displayed.    Estimated Creatinine Clearance: 88.8 mL/min (A) (by C-G formula based on SCr of 0.46 mg/dL (L)).    No Known Allergies  Antimicrobials this admission: 12/15 cefazolin periop 12/17 meropenem >>  12/17 Eraxis >>   Dose adjustments this admission:  Microbiology results: 12/7 resp panel: COVID: neg, Flu: neg 12/17 BCx: ngtd 12/17 MRSA PCR: sent  Thank you for allowing pharmacy to be a part of this patient's care.  1/18 PharmD, BCPS Clinical Pharmacist [facility] main pharmacy [phone] 02/07/2020 7:57 AM

## 2020-02-08 LAB — BASIC METABOLIC PANEL
Anion gap: 10 (ref 5–15)
BUN: 16 mg/dL (ref 8–23)
CO2: 28 mmol/L (ref 22–32)
Calcium: 8.5 mg/dL — ABNORMAL LOW (ref 8.9–10.3)
Chloride: 93 mmol/L — ABNORMAL LOW (ref 98–111)
Creatinine, Ser: 0.6 mg/dL — ABNORMAL LOW (ref 0.61–1.24)
GFR, Estimated: >60 mL/min (ref >60)
Glucose, Bld: 121 mg/dL — ABNORMAL HIGH (ref 70–99)
Potassium: 4.3 mmol/L (ref 3.5–5.1)
Sodium: 131 mmol/L — ABNORMAL LOW (ref 135–145)

## 2020-02-08 LAB — CULTURE, BLOOD (ROUTINE X 2)
Culture: NO GROWTH
Culture: NO GROWTH
Special Requests: ADEQUATE
Special Requests: ADEQUATE

## 2020-02-08 LAB — CBC
HCT: 40.3 % (ref 39.0–52.0)
Hemoglobin: 13 g/dL (ref 13.0–17.0)
MCH: 24.8 pg — ABNORMAL LOW (ref 26.0–34.0)
MCHC: 32.3 g/dL (ref 30.0–36.0)
MCV: 76.8 fL — ABNORMAL LOW (ref 80.0–100.0)
Platelets: 428 10*3/uL — ABNORMAL HIGH (ref 150–400)
RBC: 5.25 MIL/uL (ref 4.22–5.81)
RDW: 14.9 % (ref 11.5–15.5)
WBC: 15.1 10*3/uL — ABNORMAL HIGH (ref 4.0–10.5)
nRBC: 0 % (ref 0.0–0.2)

## 2020-02-08 LAB — GLUCOSE, CAPILLARY
Glucose-Capillary: 132 mg/dL — ABNORMAL HIGH (ref 70–99)
Glucose-Capillary: 125 mg/dL — ABNORMAL HIGH (ref 70–99)
Glucose-Capillary: 124 mg/dL — ABNORMAL HIGH (ref 70–99)
Glucose-Capillary: 129 mg/dL — ABNORMAL HIGH (ref 70–99)

## 2020-02-08 LAB — MAGNESIUM: Magnesium: 2.3 mg/dL (ref 1.7–2.4)

## 2020-02-08 LAB — PHOSPHORUS: Phosphorus: 3 mg/dL (ref 2.5–4.6)

## 2020-02-08 MED ORDER — TRAVASOL 10 % IV SOLN
INTRAVENOUS | Status: AC
  Filled 2020-02-08: qty 1080

## 2020-02-08 NOTE — Progress Notes (Signed)
Patient's daughter expresses her dad feeling gloomy and needing some uplifting. Spiritual consult placed.

## 2020-02-08 NOTE — Progress Notes (Signed)
PT Cancellation Note  Patient Details Name: Patrick Lam MRN: [id number] DOB: 03/09/1945   Cancelled Treatment:    Reason Eval/Treat Not Completed: Other (comment) (pt just walked in the hallway with RN, she stated SaO2 was 90 on room air, HR 130. Pt now fatigued. Will follow.)  Tamala Ser PT 02/08/2020  Acute Rehabilitation Services Pager [phone] Office [phone]

## 2020-02-08 NOTE — Progress Notes (Signed)
PHARMACY - TOTAL PARENTERAL NUTRITION CONSULT NOTE   Indication: Bowel obstruction  Patient Measurements: Height: 6\' 1"  (185.4 cm) Weight: 81.6 kg (179 lb 14.3 oz) IBW/kg (Calculated) : 79.9 TPN AdjBW (KG): 81.6 Body mass index is 23.73 kg/m.  Assessment: Patient is a 74 y.o M presented to the ED on 12/7 with abdominal pain.  Abd CT showed obstruction secondary to right inguinal hernia.  He underwent hernia repair on 12/7.  He subsequently developed n/v s/p procedure, placed on NPO, and NG placed on 12/13. Pharmacy consulted on 12/15 to dose TPN.  Glucose / Insulin: CBG range 124-135 with sSSI q 6h.  Total of 2 units SSI in 24 hours  - PTA metformin held Electrolytes:  Na decreased further to 131, Cl low at 93.  Others WNL including K 4.3, CorrCa 10.1, Mag, and Phos.  Goal K > 2, mag > 2 Renal: Scr <1, BUN wnl LFTs / TGs: LFTs wnl (12/20), triglycerides up to 119 (12/20) Prealbumin / albumin: prealbumin increased to 13.1 (12/20); Albumin 2 Intake / Output; MIVF: I/O: UOP 4700 ml; NG output 1950 ml; NS @ KVO.  Unmeasured stool x0.  PO intake 480 mL GI Imaging: - 12/7 abd 14/7 bowel containing right inguinal hernia causing some degree of obstruction - 12/12 abd CT:  Small bowel diffusely dilated and fluid-filled with transition point in the right lower quadrant. Differential considerations include postoperative ileus or obstruction, the presence of a transition point fevers obstruction. 12/17 abd CT: No evidence of bowel injury. Some proximal bowel dilatation is likely related to a degree of postoperative ileus. Enteric contrast reaches the colon. Free air is expected 2 days post intra-abdominal surgery.Segment of wall thickening involving the descending colon which is nonspecific.Colonic diverticulosis without diverticulitis.Sequela of recent right inguinal Surgeries / Procedures:  -12/7: repair of inguinal incarcerated hernia with mesh - 12/13: NG placement - 12/15: ex lap,  LOA  Central access: PICC double lumen (12/16) TPN start date: 12/16  Nutritional Goals:  Dietician  Recommendations  (On 12/16 ) Kcal:  2050-2250, Protein:  105-115g, Fluid:  2L/day  TPN at target rate of 90 ml/hr provides: 2073 kcal, 108 grams of protein daily.  Current Nutrition:  TPN NGT - clamping trial 12/21  Plan:  At 1800:  Continue TPN at goal rate of 90 mL/hr at 1800  Electrolytes in TPN:   - Increase to 180mEq/L of Na  - Decrease to 5 mEq/L of Mg  - Continue 45 mEq/L of K, 24mEq/L of Ca, and 20 mmol/L of Phos.    - Change Cl:Ac ratio to Max Cl  Add standard MVI and trace elements to TPN  Reduce sSSI to q 8 hours  Continue M[facility]F at [facility]  Monitor TPN labs on Mon/Thurs.   [facility] PharmD, BCPS Clinical Pharmacist [facility] main pharmacy [phone] 02/08/2020 7:18 AM

## 2020-02-08 NOTE — Progress Notes (Signed)
Assessment & Plan: Repair incarcerated right inguinal hernia with mesh 01/24/2020 - Dr. Magnus Ivan Laparoscopic lysis of adhesions 02/01/2020 - Dr. Almond Lint  Persistent ileus on AXR with high NG output  TNA, NPO  NG decompression  Discontinue Foley cath  OOB, ambulate - PT seeing   FEN: TPN/n.p.o. ID:   anidulafungin 12/17 >> day 5;  meropenem 12/17>> day 5 DVT: Lovenox  Follow-up: Dr. Jenetta Downer, MD       [facility]       Office: [phone]   Chief Complaint: RIH, post op SBO, ileus  Subjective: Patient in bed.  Frustrated.  No flatus.  Objective: Vital signs in last 24 hours: Temp:  [98 F (36.7 C)-98.7 F (37.1 C)] 98.2 F (36.8 C) (12/22 0606) Pulse Rate:  [84-96] 96 (12/22 0606) Resp:  [16-17] 16 (12/22 0606) BP: (131-134)/(71-76) 131/71 (12/22 0606) SpO2:  [94 %-95 %] 95 % (12/22 0606) Last BM Date: 02/07/20  Intake/Output from previous day: 12/21 0701 - 12/22 0700 In: 3318.4 [P.O.:480; I.V.:2230.9; IV Piggyback:607.5] Out: 6650 [Urine:4700; Emesis/NG output:1950] Intake/Output this shift: No intake/output data recorded.  Physical Exam: HEENT - sclerae clear, mucous membranes moist Neck - soft Abdomen - soft without distension; few BS present; right inguinal wound intact with ecchymosis Ext - no edema, non-tender Neuro - alert & oriented  Lab Results:  Recent Labs    02/06/20 1813 02/08/20 0340  WBC 14.9* 15.1*  HGB 10.8* 13.0  HCT 34.0* 40.3  PLT 324 428*   BMET Recent Labs    02/07/20 0304 02/08/20 0340  NA 133* 131*  K 4.3 4.3  CL 96* 93*  CO2 29 28  GLUCOSE 108* 121*  BUN 14 16  CREATININE 0.46* 0.60*  CALCIUM 8.1* 8.5*   PT/INR No results for input(s): LABPROT, INR in the last 72 hours. Comprehensive Metabolic Panel:    Component Value Date/Time   NA 131 (L) 02/08/2020 0340   NA 133 (L) 02/07/2020 0304   K 4.3 02/08/2020 0340   K 4.3 02/07/2020 0304   CL 93 (L)  02/08/2020 0340   CL 96 (L) 02/07/2020 0304   CO2 28 02/08/2020 0340   CO2 29 02/07/2020 0304   BUN 16 02/08/2020 0340   BUN 14 02/07/2020 0304   CREATININE 0.60 (L) 02/08/2020 0340   CREATININE 0.46 (L) 02/07/2020 0304   GLUCOSE 121 (H) 02/08/2020 0340   GLUCOSE 108 (H) 02/07/2020 0304   CALCIUM 8.5 (L) 02/08/2020 0340   CALCIUM 8.1 (L) 02/07/2020 0304   AST 27 02/06/2020 0420   AST 23 02/02/2020 0512   ALT 25 02/06/2020 0420   ALT 23 02/02/2020 0512   ALKPHOS 63 02/06/2020 0420   ALKPHOS 49 02/02/2020 0512   BILITOT 0.2 (L) 02/06/2020 0420   BILITOT 0.7 02/02/2020 0512   PROT 5.2 (L) 02/06/2020 0420   PROT 5.5 (L) 02/02/2020 0512   ALBUMIN 2.0 (L) 02/06/2020 0420   ALBUMIN 2.4 (L) 02/02/2020 0512    Studies/Results: DG Abd Portable 1V  Result Date: 02/07/2020 CLINICAL DATA:  Ileus EXAM: PORTABLE ABDOMEN - 1 VIEW COMPARISON:  02/03/2020 and 01/31/2020 FINDINGS: In is a gastric tube is present in the stomach. There is some dilute contrast medium in the right colon. Loops of dilated small bowel are present in the upper abdomen, measuring up to 4.7 cm in diameter. These measured up to 5.3 cm on 01/31/2020 and on 02/03/2020. There is  formed stool in the distal colon. Mild lower lumbar spondylosis and degenerative disc disease. IMPRESSION: 1. Mildly dilated small bowel, potentially from ileus or obstruction. Small bowel loops measure up to 4.7 cm in diameter, and previously measured up to 5.3 cm in diameter. 2. There is some dilute contrast medium in the right colon. Electronically Signed   By: Gaylyn Rong M.D.   On: 02/07/2020 13:44      Darnell Level 02/08/2020  Patient ID: Patrick Lam, male   DOB: [birth year], 74 y.o.   MRN: [id number]

## 2020-02-09 LAB — COMPREHENSIVE METABOLIC PANEL
ALT: 163 U/L — ABNORMAL HIGH (ref 0–44)
AST: 113 U/L — ABNORMAL HIGH (ref 15–41)
Albumin: 2.7 g/dL — ABNORMAL LOW (ref 3.5–5.0)
Alkaline Phosphatase: 134 U/L — ABNORMAL HIGH (ref 38–126)
Anion gap: 8 (ref 5–15)
BUN: 18 mg/dL (ref 8–23)
CO2: 28 mmol/L (ref 22–32)
Calcium: 8.5 mg/dL — ABNORMAL LOW (ref 8.9–10.3)
Chloride: 95 mmol/L — ABNORMAL LOW (ref 98–111)
Creatinine, Ser: 0.55 mg/dL — ABNORMAL LOW (ref 0.61–1.24)
GFR, Estimated: >60 mL/min (ref >60)
Glucose, Bld: 131 mg/dL — ABNORMAL HIGH (ref 70–99)
Potassium: 4.5 mmol/L (ref 3.5–5.1)
Sodium: 131 mmol/L — ABNORMAL LOW (ref 135–145)
Total Bilirubin: 0.3 mg/dL (ref 0.3–1.2)
Total Protein: 6.4 g/dL — ABNORMAL LOW (ref 6.5–8.1)

## 2020-02-09 LAB — GLUCOSE, CAPILLARY
Glucose-Capillary: 144 mg/dL — ABNORMAL HIGH (ref 70–99)
Glucose-Capillary: 123 mg/dL — ABNORMAL HIGH (ref 70–99)
Glucose-Capillary: 132 mg/dL — ABNORMAL HIGH (ref 70–99)

## 2020-02-09 LAB — PHOSPHORUS: Phosphorus: 3.1 mg/dL (ref 2.5–4.6)

## 2020-02-09 LAB — MAGNESIUM: Magnesium: 2.2 mg/dL (ref 1.7–2.4)

## 2020-02-09 MED ORDER — METOCLOPRAMIDE HCL 5 MG/ML IJ SOLN
5.0000 mg | Freq: Three times a day (TID) | INTRAMUSCULAR | Status: DC
  Administered 2020-02-09 – 2020-02-14 (×15): 5 mg via INTRAVENOUS
  Filled 2020-02-09 (×15): qty 2

## 2020-02-09 MED ORDER — TRAVASOL 10 % IV SOLN
INTRAVENOUS | Status: AC
  Filled 2020-02-09: qty 1080

## 2020-02-09 NOTE — Progress Notes (Signed)
NGT air flushed q 4 hours per recommendation with 120 of air.  TPN infusing without difficulty thru  RUA picc  Double lumen. IV team in to draw labs and assess. NGT  Noted with output of greenish drainage. Cannister changed out. Phone and call light in reach and rounds continued per unit protocol and MD orders.

## 2020-02-09 NOTE — Progress Notes (Signed)
Progress Note  8 Days Post-Op  Subjective: Patient reports some mild abdominal pain. Reports nausea despite NGT and has had 2.5L recorded output from NGT in last 24h. Reports some BM 2 days ago and passing some flatus, most recently a few hrs ago. He is walking around 2x daily but reports he has a hard time getting help with walking. He feels weak.   Objective: Vital signs in last 24 hours: Temp:  [98.2 F (36.8 C)-98.6 F (37 C)] 98.2 F (36.8 C) (12/23 0621) Pulse Rate:  [87-91] 91 (12/23 0621) Resp:  [18] 18 (12/23 0621) BP: (136-140)/(66-81) 136/81 (12/23 0621) SpO2:  [95 %-96 %] 96 % (12/23 0621) Last BM Date: 02/07/20  Intake/Output from previous day: 12/22 0701 - 12/23 0700 In: 2096.4 [P.O.:180; I.V.:1478.9; IV Piggyback:437.6] Out: 4600 [Urine:2100; Emesis/NG output:2500] Intake/Output this shift: Total I/O In: 120 [P.O.:120] Out: 100 [Emesis/NG output:100]  PE: General: pleasant, WD, elderly male who is laying in bed in NAD Heart: regular, rate, and rhythm.  Lungs: CTAB, no wheezes, rhonchi, or rales noted.  Respiratory effort nonlabored Abd: soft, NT, mildly distended, +BS, NGT with bilious drainage, R groin incision c/d/i with resolving ecchymosis MS: all 4 extremities are symmetrical with no cyanosis, clubbing, or edema. Skin: warm and dry with no masses, lesions, or rashes Neuro: Cranial nerves 2-12 grossly intact, sensation is normal throughout Psych: A&Ox3 with an appropriate affect.    Lab Results:  Recent Labs    02/06/20 1813 02/08/20 0340  WBC 14.9* 15.1*  HGB 10.8* 13.0  HCT 34.0* 40.3  PLT 324 428*   BMET Recent Labs    02/08/20 0340 02/09/20 0433  NA 131* 131*  K 4.3 4.5  CL 93* 95*  CO2 28 28  GLUCOSE 121* 131*  BUN 16 18  CREATININE 0.60* 0.55*  CALCIUM 8.5* 8.5*   PT/INR No results for input(s): LABPROT, INR in the last 72 hours. CMP     Component Value Date/Time   NA 131 (L) 02/09/2020 0433   K 4.5 02/09/2020 0433    CL 95 (L) 02/09/2020 0433   CO2 28 02/09/2020 0433   GLUCOSE 131 (H) 02/09/2020 0433   BUN 18 02/09/2020 0433   CREATININE 0.55 (L) 02/09/2020 0433   CALCIUM 8.5 (L) 02/09/2020 0433   PROT 6.4 (L) 02/09/2020 0433   ALBUMIN 2.7 (L) 02/09/2020 0433   AST 113 (H) 02/09/2020 0433   ALT 163 (H) 02/09/2020 0433   ALKPHOS 134 (H) 02/09/2020 0433   BILITOT 0.3 02/09/2020 0433   GFRNONAA >60 02/09/2020 0433   GFRAA >60 02/04/2018 0514   Lipase  No results found for: LIPASE     Studies/Results: DG Abd Portable 1V  Result Date: 02/07/2020 CLINICAL DATA:  Ileus EXAM: PORTABLE ABDOMEN - 1 VIEW COMPARISON:  02/03/2020 and 01/31/2020 FINDINGS: In is a gastric tube is present in the stomach. There is some dilute contrast medium in the right colon. Loops of dilated small bowel are present in the upper abdomen, measuring up to 4.7 cm in diameter. These measured up to 5.3 cm on 01/31/2020 and on 02/03/2020. There is formed stool in the distal colon. Mild lower lumbar spondylosis and degenerative disc disease. IMPRESSION: 1. Mildly dilated small bowel, potentially from ileus or obstruction. Small bowel loops measure up to 4.7 cm in diameter, and previously measured up to 5.3 cm in diameter. 2. There is some dilute contrast medium in the right colon. Electronically Signed   By: Annitta Needs.D.  On: 02/07/2020 13:44    Anti-infectives: Anti-infectives (From admission, onward)   Start     Dose/Rate Route Frequency Ordered Stop   02/04/20 1400  anidulafungin (ERAXIS) 100 mg in sodium chloride 0.9 % 100 mL IVPB        100 mg 78 mL/hr over 100 Minutes Intravenous Every 24 hours 02/03/20 1241     02/03/20 1400  piperacillin-tazobactam (ZOSYN) IVPB 3.375 g  Status:  Discontinued        3.375 g 12.5 mL/hr over 240 Minutes Intravenous Every 8 hours 02/03/20 1237 02/03/20 1239   02/03/20 1400  anidulafungin (ERAXIS) 200 mg in sodium chloride 0.9 % 200 mL IVPB        200 mg 78 mL/hr over 200 Minutes  Intravenous  Once 02/03/20 1241 02/03/20 2024   02/03/20 1400  meropenem (MERREM) 1 g in sodium chloride 0.9 % 100 mL IVPB        1 g 200 mL/hr over 30 Minutes Intravenous Every 8 hours 02/03/20 1301     02/03/20 1330  ceFEPIme (MAXIPIME) 2 g in sodium chloride 0.9 % 100 mL IVPB  Status:  Discontinued        2 g 200 mL/hr over 30 Minutes Intravenous  Once 02/03/20 1237 02/03/20 1239   02/01/20 1345  ceFAZolin (ANCEF) 2-4 GM/100ML-% IVPB       Note to Pharmacy: Sharyn Creamer   : cabinet override      02/01/20 1345 02/02/20 0159   01/25/20 0500  ceFAZolin (ANCEF) IVPB 2g/100 mL premix        2 g 200 mL/hr over 30 Minutes Intravenous Every 8 hours 01/25/20 0004 01/25/20 0537   01/24/20 2103  ceFAZolin (ANCEF) 2-4 GM/100ML-% IVPB       Note to Pharmacy: Johnette Abraham   : cabinet override      01/24/20 2103 01/24/20 2134   01/24/20 2015  ceFAZolin (ANCEF) IVPB 2g/100 mL premix        2 g 200 mL/hr over 30 Minutes Intravenous  Once 01/24/20 2014 01/24/20 2134       Assessment/Plan HTN Narcolepsy  Repair incarcerated right inguinal hernia with mesh 01/24/2020 - Dr. Magnus Ivan Laparoscopic lysis of adhesions 02/01/2020 - Dr. Almond Lint - persistent ileus with high output from NGT - try adding Reglan 5 mg TID today, continue NGT on LIWS, continue TPN - continue to mobilize as tolerated - continue daily suppository - repeat film in AM, repeat labs in AM - consider stopping abx tomorrow   FEN: NPO, TPN, NGT to LIWS VTE: lovenox ID: merrem 12/17>>, eraxis 12/18>>  LOS: 16 days    Juliet Rude , [facility] 02/09/2020, 9:26 AM Please see Amion for pager number during day hours 7:00am-4:30pm

## 2020-02-09 NOTE — Progress Notes (Signed)
Chaplain engaged in initial visit with Patrick Lam.  [name] shared that he lost his wife in July and that he hasn't been feeling well.  Chaplain uplifted the grief he has been feeling and acknowledged how hard it can be to be in the hospital.    [name] noted that he is very close to her daughter and has her as support.  Chaplain offered ministries of presence, listening and prayer.      02/09/20 1200  Clinical Encounter Type  Visited With Patient  Visit Type Initial

## 2020-02-09 NOTE — Progress Notes (Signed)
Physical Therapy Treatment Patient Details Name: Patrick Lam MRN: [id number] DOB: 29-Oct-1945 Today's Date: 02/09/2020    History of Present Illness Patient is 74 y.o. male who present to [facility] on 01/24/20 with c/o abdominal pain around his bellybutton that started to radiate to his right groin and testicles. Imaging in ED revealed large incarcerated Rt inguinal hernia. Patient now s/p open Rt inguinal hernia repair for incarcerated RIH on 01/24/20 and dx lap with LOA for SBO on 02/01/20. PMH significant for HTN, DM, OA, narcolepsy, and Lt TKA in 2019.    PT Comments    Pt reports he was sitting up in the recliner earlier today and became dizzy. Assessed orthostatic vital signs: supine 131/77 HR 97, sit 118/72  HR 105, stand 103/66  HR 112, pt denied dizziness in sitting and standing but appeared unwell. Pt performed marching in standing at edge of bed for ~2 minutes, then became fatigued and returned to bed. Instructed pt in supine BLE strengthening exercises.   Follow Up Recommendations  SNF;Home health PT (pending progress and family support)     Equipment Recommendations  None recommended by PT    Recommendations for Other Services       Precautions / Restrictions Precautions Precautions: Fall Precaution Comments: NPO x ice chips, NG tube Restrictions Weight Bearing Restrictions: No    Mobility  Bed Mobility Overal bed mobility: Modified Independent;Needs Assistance Bed Mobility: Supine to Sit     Supine to sit: Supervision;HOB elevated Sit to supine: Min assist   General bed mobility comments: assist for BLEs into bed  Transfers Overall transfer level: Needs assistance Equipment used: Rolling walker (2 wheeled) Transfers: Sit to/from Stand Sit to Stand: Supervision;From elevated surface         General transfer comment: stood with RW at edge of bed and performed marching in place. Did not ambulate 2* orthostatic hypotension. supine 131/77, sit 118/72, stand  103/66  Ambulation/Gait                 Stairs             Wheelchair Mobility    Modified Rankin (Stroke Patients Only)       Balance Overall balance assessment: Needs assistance Sitting-balance support: Feet supported Sitting balance-Leahy Scale: Good     Standing balance support: During functional activity;Bilateral upper extremity supported Standing balance-Leahy Scale: Fair                              Cognition Arousal/Alertness: Awake/alert Behavior During Therapy: WFL for tasks assessed/performed;Flat affect Overall Cognitive Status: Within Functional Limits for tasks assessed                                 General Comments: AxO x 3, feels poorly today, stated he got dizzy while sitting up in recliner earlier today      Exercises General Exercises - Lower Extremity Hip Flexion/Marching: AROM;Both;20 reps;Standing Mini-Sqauts: AROM;Both;5 reps;Standing  Ankle pumps x 10 B AROM supine Quad sets x 5 B AROM supine Gluteal sets x 2 B AROM supine    General Comments        Pertinent Vitals/Pain Faces Pain Scale: Hurts little more Pain Location: ABD surgical site Pain Descriptors / Indicators: Grimacing Pain Intervention(s): Limited activity within patient's tolerance;Monitored during session    Home Living  Prior Function            PT Goals (current goals can now be found in the care plan section) Acute Rehab PT Goals Patient Stated Goal: get strength back PT Goal Formulation: With patient Time For Goal Achievement: 02/17/20 Potential to Achieve Goals: Good Progress towards PT goals: Not progressing toward goals - comment (orthostatic hypotension today)    Frequency    Min 3X/week      PT Plan Current plan remains appropriate    Co-evaluation              AM-PAC PT "6 Clicks" Mobility   Outcome Measure  Help needed turning from your back to your side while in a  flat bed without using bedrails?: A Little Help needed moving from lying on your back to sitting on the side of a flat bed without using bedrails?: A Little Help needed moving to and from a bed to a chair (including a wheelchair)?: A Little Help needed standing up from a chair using your arms (e.g., wheelchair or bedside chair)?: A Little Help needed to walk in hospital room?: A Lot Help needed climbing 3-5 steps with a railing? : A Lot 6 Click Score: 16    End of Session Equipment Utilized During Treatment: Gait belt;Oxygen Activity Tolerance: Patient tolerated treatment well Patient left: in bed;with call bell/phone within reach;with bed alarm set Nurse Communication: Mobility status PT Visit Diagnosis: Other abnormalities of gait and mobility (R26.89);Muscle weakness (generalized) (M62.81);Difficulty in walking, not elsewhere classified (R26.2)     Time: 9924-2683 PT Time Calculation (min) (ACUTE ONLY): 24 min  Charges:  $Therapeutic Exercise: 8-22 mins $Therapeutic Activity: 8-22 mins                    Ralene Bathe Kistler PT 02/09/2020  Acute Rehabilitation Services Pager [phone] Office [phone]

## 2020-02-09 NOTE — Discharge Instructions (Signed)
[facility]  UMBILICAL OR INGUINAL HERNIA REPAIR: POST OP INSTRUCTIONS  Always review your discharge instruction sheet given to you by the facility where your surgery was performed. IF YOU HAVE DISABILITY OR FAMILY LEAVE FORMS, YOU MUST BRING THEM TO THE OFFICE FOR PROCESSING.   DO NOT GIVE THEM TO YOUR DOCTOR.  1. A  prescription for pain medication may be given to you upon discharge.  Take your pain medication as prescribed, if needed.  If narcotic pain medicine is not needed, then you may take acetaminophen (Tylenol) or ibuprofen (Advil) as needed. 2. Take your usually prescribed medications unless otherwise directed. If you need a refill on your pain medication, please contact your pharmacy.  They will contact our office to request authorization. Prescriptions will not be filled after 5 pm or on week-ends. 3. You should follow a light diet the first 24 hours after arrival home, such as soup and crackers, etc.  Be sure to include lots of fluids daily.  Resume your normal diet the day after surgery. 4.Most patients will experience some swelling and bruising around the umbilicus or in the groin and scrotum.  Ice packs and reclining will help.  Swelling and bruising can take several days to resolve.  6. It is common to experience some constipation if taking pain medication after surgery.  Increasing fluid intake and taking a stool softener (such as Colace) will usually help or prevent this problem from occurring.  A mild laxative (Milk of Magnesia or Miralax) should be taken according to package directions if there are no bowel movements after 48 hours. 7. Unless discharge instructions indicate otherwise, you may remove your bandages 24-48 hours after surgery, and you may shower at that time.  You may have steri-strips (small skin tapes) in place directly over the incision.  These strips should be left on the skin for 7-10 days.  If your surgeon used skin glue on the  incision, you may shower in 24 hours.  The glue will flake off over the next 2-3 weeks.  Any sutures or staples will be removed at the office during your follow-up visit. 8. ACTIVITIES:  You may resume regular (light) daily activities beginning the next day--such as daily self-care, walking, climbing stairs--gradually increasing activities as tolerated.  You may have sexual intercourse when it is comfortable.  Refrain from any heavy lifting or straining until approved by your doctor.  a.You may drive when you are no longer taking prescription pain medication, you can comfortably wear a seatbelt, and you can safely maneuver your car and apply brakes.   9.You should see your doctor in the office for a follow-up appointment approximately 2-3 weeks after your surgery.  Make sure that you call for this appointment within a day or two after you arrive home to insure a convenient appointment time.   WHEN TO CALL YOUR DOCTOR: 1. Fever over 101.0 2. Inability to urinate 3. Nausea and/or vomiting 4. Extreme swelling or bruising 5. Continued bleeding from incision. 6. Increased pain, redness, or drainage from the incision  The clinic staff is available to answer your questions during regular business hours.  Please dont hesitate to call and ask to speak to one of the nurses for clinical concerns.  If you have a medical emergency, go to the nearest emergency room or call 911.  A surgeon from [facility] is always on call at the hospital   [address], [place], [place], [place]  [address] ?  [id number], [place], [place]   [address] [phone] ? [phone] ? FAX [phone] Web site: [email or web address]   Ileus  Ileus is a condition that happens when the intestines, which are also called bowels, stop working correctly. The intestines are hollow organs that digest food after the food leaves the stomach. These organs are long, muscular tubes that connect the  stomach to the rectum. When ileus occurs, the muscular contractions that cause food to move through the intestines do not happen as they normally would. If the intestines stop working, food cannot pass through to get digested. This condition is a serious problem that usually requires hospitalization. It can cause symptoms such as nausea, abdominal pain, and bloating. Ileus can last from a few hours to a few days. What are the causes? This condition may be caused by:  Surgery on the abdomen.  An infection or inflammation in the abdomen. This includes inflammation of the lining of the abdomen (peritonitis).  Infection or inflammation in other parts of the body, such as pneumonia or pancreatitis.  Passage of gallstones or kidney stones.  Damage to the nerves or blood vessels that go to the intestines.  A collection of blood within the abdominal cavity.  Imbalance in the salts in the blood (electrolytes).  Injury to the brain or spinal cord.  Medicines. Many medicines, including strong pain medicines, can cause ileus or make it worse. If the intestines stop working because of a blockage, that is a different condition that is called a bowel obstruction. What are the signs or symptoms? Symptoms of this condition include:  Bloating of the abdomen.  Pain or discomfort in the abdomen.  Poor appetite.  Nausea and vomiting.  Lack of normal bowel sounds, such as "growling" in the stomach. How is this diagnosed? This condition may be diagnosed with:  A physical exam and medical history.  X-rays or a CT scan of the abdomen. You may also have other tests to help find the cause of the condition. How is this treated? This condition may be treated by:  Resting the intestines until they start to work again. This is often done by: ? Stopping oral intake of food and drink. You will be given fluid through an IV to prevent dehydration. ? Placing a small tube (nasogastric tube or NG tube) that  is passed through your nose and into your stomach. The tube is attached to a suction device and keeps the stomach emptied out. This allows the bowels to rest and helps to reduce nausea and vomiting.  Correcting any electrolyte imbalance by giving supplements in the IV fluid.  Stopping any medicines that might make ileus worse.  Treating any condition that may have caused ileus. Follow these instructions at home: Eating and drinking   Follow instructions from your health care provider about: ? What to eat and drink. You may be told to start eating a bland diet. Over time, you may slowly resume a more normal, healthy diet. ? How much to eat and drink. You should eat small meals often and stop eating when you feel full.  Avoid alcohol. General instructions  Take over-the-counter and prescription medicines only as told by your health care provider.  Rest as told by your health care provider.  Avoid sitting for a long time without moving. Get up to take short walks every 1-2 hours. Ask for help if you feel weak or unsteady.  Keep all follow-up visits as told by your health care provider. This is  important. Contact a health care provider if:  You have nausea, vomiting, or abdominal discomfort.  You have a fever. Get help right away if:  You have severe abdominal pain or bloating.  You cannot eat or drink without vomiting. Summary  Ileus is a condition that happens when the intestines, which are also called bowels, stop working correctly.  When ileus occurs, the muscular contractions that cause food to move through the intestines do not happen as they normally would.  Ileus can cause symptoms such as nausea, abdominal pain, and bloating.  Treatment may involve getting IV fluids and having a nasogastric tube placed to keep your stomach emptied out until the intestines start working again. This information is not intended to replace advice given to you by your health care provider.  Make sure you discuss any questions you have with your health care provider. Document Revised: 06/01/2017 Document Reviewed: 06/01/2017 Elsevier Patient Education  2020 [employer].

## 2020-02-09 NOTE — Progress Notes (Signed)
Nutrition Follow-up  INTERVENTION:   -Needs new weight for admission (noted weight was ordered 12/21) -TPN management per Pharmacy -Will monitor for diet advancement  NUTRITION DIAGNOSIS:   Inadequate oral intake related to  (ileus) as evidenced by NPO status.  Ongoing.  GOAL:   Patient will meet greater than or equal to 90% of their needs  Meeting with TPN.  MONITOR:   Diet advancement,Labs,Weight trends,I & O's (TPN)  ASSESSMENT:   74 y.o. male with past medical history of hypertension, narcolepsy, arthritis that presented to the emergency department for abdominal pain. CT confirmed pt with incarcerated hernia.  12/7: admitted for incarcerated hernia, s/p repair of incarcerated right inguinal hernia 12/8: diet was advanced 12/10: diet changed to clears 12/12: developed N/V, NPO 12/13: NGT placed 12/15: s/p diagnostic lap, LOA 121/6: PICC placed, TPN initiation  Per surgery, pt with persistent ileus, high output from NGT. TPN and NPO to continue today. Pt with some nausea.  TPN currently infusing at 90 ml/hr, providing 2073 kcals and 108g protein.  I/Os:-9L since admit UOP:2.1L x 24 hrs NGT: 2.6L x 24 hrs  Admission weight: 179 lbs. No new weight for admission. Noted that order for new weight was placed on 12/21.  Medications: IV Reglan  Labs reviewed: CBGs: 124-144 Low Na Mg/Phos WNL  Diet Order:   Diet Order            Diet NPO time specified Except for: Ice Chips  Diet effective now                 EDUCATION NEEDS:   No education needs have been identified at this time  Skin:  Skin Assessment: Reviewed RN Assessment  Last BM:  12/21  Height:   Ht Readings from Last 1 Encounters:  01/25/20 6\' 1"  (1.854 m)    Weight:   Wt Readings from Last 1 Encounters:  01/25/20 81.6 kg   BMI:  Body mass index is 23.73 kg/m.  Estimated Nutritional Needs:   Kcal:  2050-2250  Protein:  105-115g  Fluid:  2L/day  09-19-1978, MS, RD,  LDN Inpatient Clinical Dietitian Contact information available via Amion

## 2020-02-09 NOTE — Plan of Care (Signed)

## 2020-02-09 NOTE — Progress Notes (Signed)
PHARMACY - TOTAL PARENTERAL NUTRITION CONSULT NOTE   Indication: Bowel obstruction  Patient Measurements: Height: 6\' 1"  (185.4 cm) Weight: 81.6 kg (179 lb 14.3 oz) IBW/kg (Calculated) : 79.9 TPN AdjBW (KG): 81.6 Body mass index is 23.73 kg/m.  Assessment: Patient is a 74 y.o M presented to the ED on 12/7 with abdominal pain.  Abd CT showed obstruction secondary to right inguinal hernia.  He underwent hernia repair on 12/7.  He subsequently developed n/v s/p procedure, placed on NPO, and NG placed on 12/13. Pharmacy consulted on 12/15 to dose TPN.  Glucose / Insulin: CBGs good with sSSI q 6h.  Total of 2 units SSI in 24 hours  - PTA metformin held Electrolytes:  Na unchanged at 131, Cl low at 95.  Others WNL - Goal K > 2, mag > 2 Renal: Scr <1, BUN wnl LFTs / TGs: LFTs went from WNL on 12/20 to elevated 12/22 - 113/163 - will recheck 12/24 , triglycerides up to 119 (12/20) Prealbumin / albumin: prealbumin increased to 13.1 (12/20); Albumin 2 Intake / Output; MIVF: I/O: UOP down 2100 ml; NG output up 2500 ml; NS @ KVO.  Unmeasured stool x0.  PO intake 480 mL GI Imaging: - 12/7 abd 14/7 bowel containing right inguinal hernia causing some degree of obstruction - 12/12 abd CT:  Small bowel diffusely dilated and fluid-filled with transition point in the right lower quadrant. Differential considerations include postoperative ileus or obstruction, the presence of a transition point fevers obstruction. 12/17 abd CT: No evidence of bowel injury. Some proximal bowel dilatation is likely related to a degree of postoperative ileus. Enteric contrast reaches the colon. Free air is expected 2 days post intra-abdominal surgery.Segment of wall thickening involving the descending colon which is nonspecific.Colonic diverticulosis without diverticulitis.Sequela of recent right inguinal Surgeries / Procedures:  -12/7: repair of inguinal incarcerated hernia with mesh - 12/13: NG placement - 12/15: ex lap,  LOA  Central access: PICC double lumen (12/16) TPN start date: 12/16  Nutritional Goals:  Dietician  Recommendations  (On 12/16 ) Kcal:  2050-2250, Protein:  105-115g, Fluid:  2L/day  TPN at target rate of 90 ml/hr provides: 2000 kcal, 108 grams of protein daily.  Current Nutrition:  TPN NGT - clamping trial 12/21  Plan:  At 1800:  Continue TPN at goal rate of 90 mL/hr at 1800, will decrease dextrose amount slightly in effort to reduce LFTs - recheck LFTs tomorrow 12/24  Electrolytes in TPN:   - Continue 126mEq/L of Na  - Continue to 5 mEq/L of Mg  - Continue 45 mEq/L of K, 3mEq/L of Ca, and 20 mmol/L of Phos.    - Continue Cl:Ac ratio to Max Cl  Add standard MVI and trace elements to TPN  Reduce sSSI to q 8 hours  [facility] M[facility]F at [facility]  Monitor TPN labs on Mon/Thurs.   [facility], PharmD, BCPS 02/09/2020 10:37 AM

## 2020-02-10 ENCOUNTER — Inpatient Hospital Stay (HOSPITAL_COMMUNITY): Payer: Medicare Other

## 2020-02-10 LAB — CBC
HCT: 40.7 % (ref 39.0–52.0)
Hemoglobin: 13.2 g/dL (ref 13.0–17.0)
MCH: 25 pg — ABNORMAL LOW (ref 26.0–34.0)
MCHC: 32.4 g/dL (ref 30.0–36.0)
MCV: 77.1 fL — ABNORMAL LOW (ref 80.0–100.0)
Platelets: 508 10*3/uL — ABNORMAL HIGH (ref 150–400)
RBC: 5.28 MIL/uL (ref 4.22–5.81)
RDW: 15.1 % (ref 11.5–15.5)
WBC: 11.6 10*3/uL — ABNORMAL HIGH (ref 4.0–10.5)
nRBC: 0 % (ref 0.0–0.2)

## 2020-02-10 LAB — COMPREHENSIVE METABOLIC PANEL
ALT: 190 U/L — ABNORMAL HIGH (ref 0–44)
AST: 119 U/L — ABNORMAL HIGH (ref 15–41)
Albumin: 2.7 g/dL — ABNORMAL LOW (ref 3.5–5.0)
Alkaline Phosphatase: 177 U/L — ABNORMAL HIGH (ref 38–126)
Anion gap: 8 (ref 5–15)
BUN: 21 mg/dL (ref 8–23)
CO2: 28 mmol/L (ref 22–32)
Calcium: 8.5 mg/dL — ABNORMAL LOW (ref 8.9–10.3)
Chloride: 96 mmol/L — ABNORMAL LOW (ref 98–111)
Creatinine, Ser: 0.58 mg/dL — ABNORMAL LOW (ref 0.61–1.24)
GFR, Estimated: >60 mL/min (ref >60)
Glucose, Bld: 128 mg/dL — ABNORMAL HIGH (ref 70–99)
Potassium: 4.4 mmol/L (ref 3.5–5.1)
Sodium: 132 mmol/L — ABNORMAL LOW (ref 135–145)
Total Bilirubin: 0.2 mg/dL — ABNORMAL LOW (ref 0.3–1.2)
Total Protein: 6.5 g/dL (ref 6.5–8.1)

## 2020-02-10 LAB — TRIGLYCERIDES: Triglycerides: 171 mg/dL — ABNORMAL HIGH (ref <150)

## 2020-02-10 LAB — GLUCOSE, CAPILLARY
Glucose-Capillary: 126 mg/dL — ABNORMAL HIGH (ref 70–99)
Glucose-Capillary: 135 mg/dL — ABNORMAL HIGH (ref 70–99)
Glucose-Capillary: 111 mg/dL — ABNORMAL HIGH (ref 70–99)

## 2020-02-10 LAB — PHOSPHORUS: Phosphorus: 3.5 mg/dL (ref 2.5–4.6)

## 2020-02-10 LAB — MAGNESIUM: Magnesium: 2.2 mg/dL (ref 1.7–2.4)

## 2020-02-10 MED ORDER — SODIUM CHLORIDE 0.9 % IV SOLN: INTRAVENOUS | Status: DC

## 2020-02-10 MED ORDER — INSULIN ASPART 100 UNIT/ML ~~LOC~~ SOLN
0.0000 [IU] | SUBCUTANEOUS | Status: DC
  Administered 2020-02-12: 21:00:00 1 [IU] via SUBCUTANEOUS

## 2020-02-10 MED ORDER — TRAVASOL 10 % IV SOLN
INTRAVENOUS | Status: AC
  Filled 2020-02-10: qty 936

## 2020-02-10 NOTE — Progress Notes (Signed)
PHARMACY - TOTAL PARENTERAL NUTRITION CONSULT NOTE   Indication: Bowel obstruction  Patient Measurements: Height: 6\' 1"  (185.4 cm) Weight: 81.6 kg (179 lb 14.3 oz) IBW/kg (Calculated) : 79.9 TPN AdjBW (KG): 81.6 Body mass index is 23.73 kg/m.  Assessment: Patient is a 74 y.o M presented to the ED on 12/7 with abdominal pain.  Abd CT showed obstruction secondary to right inguinal hernia.  He underwent hernia repair on 12/7.  He subsequently developed n/v s/p procedure, placed on NPO, and NG placed on 12/13. Pharmacy consulted on 12/15 to dose TPN.  Glucose / Insulin: Hx pre-diabetes; CBGs well controlled on goal rate TPN with minimal SSI (3 units yesterday) - PTA metformin held Electrolytes:  Na & Cl consistently borderline low; others stable WNL; K and Mg at goal for ileus Renal: SCr, BUN, bicarb all stable WNL; UOP previously robust but now borderline low; may not be completely charted Hepatic: LFTs up 2-3x ULN; normal on 12/20; suspect related to TPN, less likely related to surgery given delay in rise; albumin remains low but slightly improved Prealbumin: low but improving TG: trending up since starting TPN, now slightly elevated I/O: NG OP decreased but still nearly 1000 ml/d; negligible PO intake - [facility] at [facility] GI Imaging: - 12/7 abd CT: right inguinal hernia causing some degree of obstruction - 12/12 abd CT: persistent SBO vs ileus (favor obstruction) - 12/17 abd CT: postoperative ileus; nonspecific wall thickening involving the descending colon; colonic diverticulosis without diverticulitis. Surgeries / Procedures:  - 12/7: repair of inguinal incarcerated hernia with mesh - 12/13: NG placement - 12/15: ex lap, LOA  Central access: PICC double lumen (12/16) TPN start date: 12/16  Nutritional Goals:  Dietician  Recommendations  (12/23) Kcal:  2050-2250, Protein:  105-115g, Fluid:  2L/day Keep K >/= 4 and Mg >/= 2 with ileus  Current Nutrition:  TPN  Plan:  At  1800:  Will reduce TPN to 60 ml/hr and adjust formula to effectively cut dextrose and lipids by ~50% in the setting of rising LFTs and TGs; maintaining protein at ~90% of goal  Electrolytes in TPN: no changes (totals reduced w/ TPN rate reduction)  Na 150 mEq/L  K 45 mEq/L  Ca 5 mEq/L  Mg 5 mEq/L  Phos 20 mmol/L  Cl:Ac ratio: max Cl  Add standard MVI and trace elements to TPN  Reduce sensitive SSI to once in the evenings (has SBG most mornings)  Increase NS to 40 ml/hr  Monitor TPN labs on Mon/Thurs  CMP tomorrow; would recheck TG in 24 hr  09-19-1978, PharmD, BCPS [phone] 02/10/2020, 7:26 AM

## 2020-02-10 NOTE — TOC Progression Note (Signed)
Transition of Care [facility]) - Progression Note    Patient Details  Name: Patrick Lam MRN: [id number] Date of Birth: 1945-05-24  Transition of [facility]) CM/SW Contact  Amada Jupiter, LCSW Phone Number: 02/10/2020, 9:40 AM  Clinical Narrative:    Touched base with pt's daughter this morning.  She understands he is still not medically ready for a SNF dc.  TOC team to continue to follow to assist with dc when ready.     Expected Discharge Plan: Skilled Nursing Facility Barriers to Discharge: Continued Medical Work up  Expected Discharge Plan and Services Expected Discharge Plan: Skilled Nursing Facility In-house Referral: Clinical Social Work   Post Acute Care Choice: Skilled Nursing Facility Living arrangements for the past 2 months: Single Family Home                                       Social Determinants of Health (SDOH) Interventions    Readmission Risk Interventions Readmission Risk Prevention Plan 02/06/2020  Transportation Screening Complete  PCP or Specialist Appt within 5-7 Days Complete  Home Care Screening Complete

## 2020-02-10 NOTE — Progress Notes (Signed)
Patient ID: Cypher Paule, male   DOB: 1945/07/17, 74 y.o.   MRN: [id number]   Acute Care Surgery Service Progress Note:    Chief Complaint/Subjective: Reports he was in chair some yesterday and states his BP dropped some with walking Reports flatus and liquid BM About 1000cc bile in NG  Objective: Vital signs in last 24 hours: Temp:  [97.8 F (36.6 C)-98.3 F (36.8 C)] 97.8 F (36.6 C) (12/24 0553) Pulse Rate:  [91-95] 91 (12/24 0553) Resp:  [16-17] 16 (12/24 0553) BP: (103-131)/(65-76) 127/65 (12/24 0553) SpO2:  [94 %-95 %] 95 % (12/24 0553) Last BM Date: 02/09/20  Intake/Output from previous day: 12/23 0701 - 12/24 0700 In: 2762.9 [P.O.:410; I.V.:1602.9; IV Piggyback:750] Out: 2250 [Urine:1050; Emesis/NG output:1200] Intake/Output this shift: Total I/O In: -  Out: 250 [Urine:250]  Lungs: cta, nonlabored  Cardiovascular: reg  Abd: soft, nd, nt  Extremities: no edema, +SCDs  Neuro: alert, nonfocal  Lab Results: CBC  Recent Labs    02/08/20 0340 02/10/20 0318  WBC 15.1* 11.6*  HGB 13.0 13.2  HCT 40.3 40.7  PLT 428* 508*   BMET Recent Labs    02/09/20 0433 02/10/20 0318  NA 131* 132*  K 4.5 4.4  CL 95* 96*  CO2 28 28  GLUCOSE 131* 128*  BUN 18 21  CREATININE 0.55* 0.58*  CALCIUM 8.5* 8.5*   LFT Hepatic Function Latest Ref Rng & Units 02/10/2020 02/09/2020 02/06/2020  Total Protein 6.5 - 8.1 g/dL 6.5 6.4(L) 5.2(L)  Albumin 3.5 - 5.0 g/dL 2.7(L) 2.7(L) 2.0(L)  AST 15 - 41 U/L 119(H) 113(H) 27  ALT 0 - 44 U/L 190(H) 163(H) 25  Alk Phosphatase 38 - 126 U/L 177(H) 134(H) 63  Total Bilirubin 0.3 - 1.2 mg/dL 0.2(L) 0.3 0.2(L)   PT/INR No results for input(s): LABPROT, INR in the last 72 hours. ABG No results for input(s): PHART, HCO3 in the last 72 hours.  Invalid input(s): PCO2, PO2  Studies/Results:  Anti-infectives: Anti-infectives (From admission, onward)   Start     Dose/Rate Route Frequency Ordered Stop   02/04/20 1400  anidulafungin  (ERAXIS) 100 mg in sodium chloride 0.9 % 100 mL IVPB        100 mg 78 mL/hr over 100 Minutes Intravenous Every 24 hours 02/03/20 1241 02/09/20 1641   02/03/20 1400  piperacillin-tazobactam (ZOSYN) IVPB 3.375 g  Status:  Discontinued        3.375 g 12.5 mL/hr over 240 Minutes Intravenous Every 8 hours 02/03/20 1237 02/03/20 1239   02/03/20 1400  anidulafungin (ERAXIS) 200 mg in sodium chloride 0.9 % 200 mL IVPB        200 mg 78 mL/hr over 200 Minutes Intravenous  Once 02/03/20 1241 02/03/20 2024   02/03/20 1400  meropenem (MERREM) 1 g in sodium chloride 0.9 % 100 mL IVPB        1 g 200 mL/hr over 30 Minutes Intravenous Every 8 hours 02/03/20 1301 02/09/20 2215   02/03/20 1330  ceFEPIme (MAXIPIME) 2 g in sodium chloride 0.9 % 100 mL IVPB  Status:  Discontinued        2 g 200 mL/hr over 30 Minutes Intravenous  Once 02/03/20 1237 02/03/20 1239   02/01/20 1345  ceFAZolin (ANCEF) 2-4 GM/100ML-% IVPB       Note to Pharmacy: Georgena Spurling   : cabinet override      02/01/20 1345 02/02/20 0159   01/25/20 0500  ceFAZolin (ANCEF) IVPB 2g/100 mL premix  2 g 200 mL/hr over 30 Minutes Intravenous Every 8 hours 01/25/20 0004 01/25/20 0537   01/24/20 2103  ceFAZolin (ANCEF) 2-4 GM/100ML-% IVPB       Note to Pharmacy: Karie Chimera   : cabinet override      01/24/20 2103 01/24/20 2134   01/24/20 2015  ceFAZolin (ANCEF) IVPB 2g/100 mL premix        2 g 200 mL/hr over 30 Minutes Intravenous  Once 01/24/20 2014 01/24/20 2134      Medications: Scheduled Meds: . bisacodyl  10 mg Rectal Daily  . Chlorhexidine Gluconate Cloth  6 each Topical Daily  . clomiPRAMINE  25 mg Oral TID  . enoxaparin (LOVENOX) injection  40 mg Subcutaneous Q24H  . insulin aspart  0-9 Units Subcutaneous Q24H  . lip balm  1 application Topical BID  . mouth rinse  15 mL Mouth Rinse BID  . metoCLOPramide (REGLAN) injection  5 mg Intravenous Q8H  . pantoprazole (PROTONIX) IV  40 mg Intravenous Q24H  . tamsulosin  0.4 mg  Oral QPC supper   Continuous Infusions: . sodium chloride Stopped (02/04/20 0521)  . sodium chloride    . sodium chloride    . chlorproMAZINE (THORAZINE) IV 12.5 mg (01/31/20 1330)  . methocarbamol (ROBAXIN) IV 1,000 mg (02/08/20 0213)  . sodium chloride    . TPN ADULT (ION) 90 mL/hr at 02/09/20 1744  . TPN ADULT (ION)     PRN Meds:.acetaminophen, alum & mag hydroxide-simeth, chlorproMAZINE (THORAZINE) IV, diphenhydrAMINE, fentaNYL (SUBLIMAZE) injection, hydrALAZINE, labetalol, magic mouthwash, menthol-cetylpyridinium, methocarbamol (ROBAXIN) IV, methylphenidate, ondansetron **OR** ondansetron (ZOFRAN) IV, phenol, simethicone, sodium chloride flush, Sodium Oxybate  Assessment/Plan: Patient Active Problem List   Diagnosis Date Noted  . Acute respiratory failure ([place]) 02/05/2020  . Shock circulatory ([place]) 02/05/2020  . Incarcerated right inguinal hernia 01/24/2020  . Overweight (BMI 25.0-29.9) 02/03/2018  . 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 Ninfa Linden Laparoscopic lysis of adhesions 02/01/2020-Dr. Stark Klein - persistent ileus with high output from NGT - try adding Reglan 5 mg TID 12/23, continue NGT on LIWS, continue TPN - continue to mobilize as tolerated - continue daily suppository - film this AM showed NG in stomach, still dilated loops of SB but air also in colon. -will consider repeat ct in a day or two but no fever, wbc trending down   FEN: NPO, TPN, will start NG clamp trials today VTE: lovenox ID: merrem 12/17>> 02/09/20, eraxis 12/18>>02/09/20 Disposition: start NG clamping trials, TPN, oob.  No signs of dehydration. Monitor BP  LOS: 17 days    Leighton Ruff. Redmond Pulling, MD, FACS General, Bariatric, & Minimally Invasive Surgery [phone] [facility]

## 2020-02-11 LAB — COMPREHENSIVE METABOLIC PANEL
ALT: 158 U/L — ABNORMAL HIGH (ref 0–44)
AST: 94 U/L — ABNORMAL HIGH (ref 15–41)
Albumin: 2.6 g/dL — ABNORMAL LOW (ref 3.5–5.0)
Alkaline Phosphatase: 191 U/L — ABNORMAL HIGH (ref 38–126)
Anion gap: 8 (ref 5–15)
BUN: 23 mg/dL (ref 8–23)
CO2: 27 mmol/L (ref 22–32)
Calcium: 8.5 mg/dL — ABNORMAL LOW (ref 8.9–10.3)
Chloride: 98 mmol/L (ref 98–111)
Creatinine, Ser: 0.63 mg/dL (ref 0.61–1.24)
GFR, Estimated: >60 mL/min (ref >60)
Glucose, Bld: 103 mg/dL — ABNORMAL HIGH (ref 70–99)
Potassium: 4.4 mmol/L (ref 3.5–5.1)
Sodium: 133 mmol/L — ABNORMAL LOW (ref 135–145)
Total Bilirubin: 0.4 mg/dL (ref 0.3–1.2)
Total Protein: 6.2 g/dL — ABNORMAL LOW (ref 6.5–8.1)

## 2020-02-11 LAB — GLUCOSE, CAPILLARY: Glucose-Capillary: 105 mg/dL — ABNORMAL HIGH (ref 70–99)

## 2020-02-11 MED ORDER — SODIUM CHLORIDE (PF) 0.9 % IJ SOLN
INTRAMUSCULAR | Status: AC
  Filled 2020-02-11: qty 10

## 2020-02-11 MED ORDER — TRAVASOL 10 % IV SOLN
INTRAVENOUS | Status: AC
  Filled 2020-02-11: qty 936

## 2020-02-11 NOTE — Progress Notes (Signed)
10 Days Post-Op   Subjective/Chief Complaint: No complaints. Feels better. Tolerated clamping of ng   Objective: Vital signs in last 24 hours: Temp:  [97.6 F (36.4 C)-98 F (36.7 C)] 97.6 F (36.4 C) (12/25 0998) Pulse Rate:  [93-104] 93 (12/25 0608) Resp:  [16-18] 18 (12/25 3382) BP: (116-125)/(73-75) 122/73 (12/25 5053) SpO2:  [90 %-96 %] 90 % (12/25 9767) Last BM Date: 02/10/20  Intake/Output from previous day: 12/24 0701 - 12/25 0700 In: 2200.1 [P.O.:90; I.V.:2110.1] Out: 3650 [Urine:1650; Emesis/NG output:2000] Intake/Output this shift: Total I/O In: 1283.3 [P.O.:90; I.V.:1193.3] Out: 2050 [Urine:650; Emesis/NG output:1400]  General appearance: alert and cooperative Resp: clear to auscultation bilaterally Cardio: regular rate and rhythm GI: soft, mild tenderness  Lab Results:  Recent Labs    02/10/20 0318  WBC 11.6*  HGB 13.2  HCT 40.7  PLT 508*   BMET Recent Labs    02/10/20 0318 02/11/20 0348  NA 132* 133*  K 4.4 4.4  CL 96* 98  CO2 28 27  GLUCOSE 128* 103*  BUN 21 23  CREATININE 0.58* 0.63  CALCIUM 8.5* 8.5*   PT/INR No results for input(s): LABPROT, INR in the last 72 hours. ABG No results for input(s): PHART, HCO3 in the last 72 hours.  Invalid input(s): PCO2, PO2  Studies/Results: DG CHEST PORT 1 VIEW  Result Date: 02/10/2020 CLINICAL DATA:  Ileus, shortness of breath EXAM: PORTABLE CHEST 1 VIEW COMPARISON:  Portable exam 0541 hours compared to 02/03/2020 FINDINGS: Nasogastric tube extends into stomach. RIGHT arm PICC line tip projects over SVC. Normal heart size, mediastinal contours, and pulmonary vascularity. Bibasilar atelectasis. Remaining lungs clear. No pleural effusion or pneumothorax. No acute osseous findings. IMPRESSION: Bibasilar atelectasis. Electronically Signed   By: Ulyses Southward M.D.   On: 02/10/2020 08:21   DG Abd Portable 1V  Result Date: 02/10/2020 CLINICAL DATA:  Ileus EXAM: PORTABLE ABDOMEN - 1 VIEW COMPARISON:   Three days ago FINDINGS: Enteric tube with tip in the proximal stomach. Small bowel dilatation by gas with loops measuring up to 4.3 cm diameter. The proximal colon still contains enteric contrast. No visible pneumatosis or concerning mass effect. Retrocardiac opacity. IMPRESSION: 1. Enteric tube in good position. 2. Stable to progressed gaseous distension of small bowel. No progression of contrast from the right colon. Electronically Signed   By: Marnee Spring M.D.   On: 02/10/2020 08:27    Anti-infectives: Anti-infectives (From admission, onward)   Start     Dose/Rate Route Frequency Ordered Stop   02/04/20 1400  anidulafungin (ERAXIS) 100 mg in sodium chloride 0.9 % 100 mL IVPB        100 mg 78 mL/hr over 100 Minutes Intravenous Every 24 hours 02/03/20 1241 02/09/20 1641   02/03/20 1400  piperacillin-tazobactam (ZOSYN) IVPB 3.375 g  Status:  Discontinued        3.375 g 12.5 mL/hr over 240 Minutes Intravenous Every 8 hours 02/03/20 1237 02/03/20 1239   02/03/20 1400  anidulafungin (ERAXIS) 200 mg in sodium chloride 0.9 % 200 mL IVPB        200 mg 78 mL/hr over 200 Minutes Intravenous  Once 02/03/20 1241 02/03/20 2024   02/03/20 1400  meropenem (MERREM) 1 g in sodium chloride 0.9 % 100 mL IVPB        1 g 200 mL/hr over 30 Minutes Intravenous Every 8 hours 02/03/20 1301 02/09/20 2215   02/03/20 1330  ceFEPIme (MAXIPIME) 2 g in sodium chloride 0.9 % 100 mL IVPB  Status:  Discontinued        2 g 200 mL/hr over 30 Minutes Intravenous  Once 02/03/20 1237 02/03/20 1239   02/01/20 1345  ceFAZolin (ANCEF) 2-4 GM/100ML-% IVPB       Note to Pharmacy: Sharyn Creamer   : cabinet override      02/01/20 1345 02/02/20 0159   01/25/20 0500  ceFAZolin (ANCEF) IVPB 2g/100 mL premix        2 g 200 mL/hr over 30 Minutes Intravenous Every 8 hours 01/25/20 0004 01/25/20 0537   01/24/20 2103  ceFAZolin (ANCEF) 2-4 GM/100ML-% IVPB       Note to Pharmacy: Johnette Abraham   : cabinet override      01/24/20  2103 01/24/20 2134   01/24/20 2015  ceFAZolin (ANCEF) IVPB 2g/100 mL premix        2 g 200 mL/hr over 30 Minutes Intravenous  Once 01/24/20 2014 01/24/20 2134      Assessment/Plan: s/p Procedure(s): DIAGNOSTIC  LAPAROSCOPY (N/A) LAPAROSCOPIC LYSIS OF ADHESIONS (N/A) clamp ng all day today and allow clears  Patient Active Problem List   Diagnosis Date Noted  . Acute respiratory failure ([facility]) 02/05/2020  . Shock circulatory ([facility]) 02/05/2020  . Incarcerated right inguinal hernia 01/24/2020  . Overweight (BMI 25.0-29.9) 02/03/2018  . 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 Magnus Ivan Laparoscopic lysis of adhesions 02/01/2020-Dr. Almond Lint - persistent ileus with high output from NGT - try adding Reglan 5 mg TID 12/23, continue TPN - continue to mobilize as tolerated - continue daily suppository - film this AM showed NG in stomach, still dilated loops of SB but air also in colon. -will consider repeat ct in a day or two but no fever, wbc trending down   FEN: NPO, TPN, continue longer clamping of ng. Allow clears VTE: lovenox ID: merrem 12/17>> 02/09/20, eraxis 12/18>>02/09/20 Disposition: start NG clamping trials, TPN, oob.  No signs of dehydration. Monitor BP   LOS: 18 days    Patrick Lam 02/11/2020

## 2020-02-11 NOTE — Progress Notes (Signed)
PHARMACY - TOTAL PARENTERAL NUTRITION CONSULT NOTE   Indication: Bowel obstruction  Patient Measurements: Height: 6' 1"  (185.4 cm) Weight: 81.6 kg (179 lb 14.3 oz) IBW/kg (Calculated) : 79.9 TPN AdjBW (KG): 81.6 Body mass index is 23.73 kg/m.  Assessment: Patient is a 74 y.o M presented to the ED on 12/7 with abdominal pain.  Abd CT showed obstruction secondary to right inguinal hernia.  He underwent hernia repair on 12/7.  He subsequently developed n/v s/p procedure, placed on NPO, and NG placed on 12/13. Pharmacy consulted on 12/15 to dose TPN.  Glucose / Insulin: Hx pre-diabetes; CBGs well controlled, 0 units SSI required in past 24h - PTA metformin held Electrolytes:  Na remains borderline low; others stable WNL; K and Mg at goal for ileus Renal: SCr, BUN, bicarb all stable WNL Hepatic: LFTs up 2-3x ULN 12/24; normal on 12/20; suspect related to TPN, less likely related to surgery given delay in rise. AST/ALT trending down today after adjustment of TPN yesterday, Alk Phos trending up, Tbili WNL; albumin remains low at 2.6 Prealbumin: low but improving TG: trending up since starting TPN, now slightly elevated (12/24) I/O: NG OP 2L; I/O 1650/3650 - MIVF: NS at 40 ml/hr GI Imaging: - 12/7 abd CT: right inguinal hernia causing some degree of obstruction - 12/12 abd CT: persistent SBO vs ileus (favor obstruction) - 12/17 abd CT: postoperative ileus; nonspecific wall thickening involving the descending colon; colonic diverticulosis without diverticulitis. Surgeries / Procedures:  - 12/7: repair of inguinal incarcerated hernia with mesh - 12/13: NG placement - 12/15: ex lap, LOA  Central access: PICC double lumen (12/16) TPN start date: 12/16  Nutritional Goals:   Dietician  Recommendations  (12/23) Kcal:  2050-2250, Protein:  105-115g, Fluid:  2L/day  Keep K >/= 4 and Mg >/= 2 with ileus  Current Nutrition:  TPN, clear liquids  Plan:  At 1800:  Continue reduced TPN at 60  ml/hr with adjusted formula from 12/24 (to effectively cut dextrose and lipids by ~50% in the setting of rising LFTs and TGs; maintaining protein at ~90% of goal)  Electrolytes in TPN: no changes (totals reduced w/ TPN rate reduction)  Na 150 mEq/L  K 45 mEq/L  Ca 5 mEq/L  Mg 5 mEq/L  Phos 20 mmol/L  Cl:Ac ratio: max Cl  Add standard MVI and trace elements to TPN  Reduce sensitive SSI to once in the evenings (has SBG most mornings)  Continue NS at 40 ml/hr  Monitor TPN labs on Mon/Thurs  CMET, Mag, Phos in AM   Lindell Spar, PharmD, BCPS Clinical Pharmacist  02/11/2020, 11:11 AM

## 2020-02-11 NOTE — Progress Notes (Signed)
Physical Therapy Treatment Patient Details Name: Patrick Lam MRN: [id number] DOB: December 21, 1945 Today's Date: 02/11/2020    History of Present Illness Patient is 74 y.o. male who [facility] to [facility] on 01/24/20 with c/o abdominal pain around his bellybutton that started to radiate to his right groin and testicles. Imaging in ED revealed large incarcerated Rt inguinal hernia. Patient now s/p open Rt inguinal hernia repair for incarcerated RIH on 01/24/20 and dx lap with LOA for SBO on 02/01/20. PMH significant for HTN, DM, OA, narcolepsy, and Lt TKA in 2019.    PT Comments    Assisted OOB to amb.  General Gait Details: amb on RA sats avg 92% but c/o 3/4 dyspnea Assisted back to bed with 2 warm blankets.    Follow Up Recommendations  SNF;Home health PT  Pending progress   Equipment Recommendations  None recommended by PT    Recommendations for Other Services       Precautions / Restrictions Precautions Precautions: Fall Precaution Comments: clears, NG clamped    Mobility  Bed Mobility Overal bed mobility: Modified Independent             General bed mobility comments: self able with increased time and assist blankets/lines  Transfers Overall transfer level: Needs assistance Equipment used: Rolling walker (2 wheeled) Transfers: Sit to/from Stand Sit to Stand: Supervision;From elevated surface Stand pivot transfers: Supervision       General transfer comment: good safety cognition and use of hands to steady self  Ambulation/Gait Ambulation/Gait assistance: Supervision;Min guard Gait Distance (Feet): 195 Feet Assistive device: Rolling walker (2 wheeled) Gait Pattern/deviations: Step-through pattern;Decreased stride length Gait velocity: decreased   General Gait Details: amb on RA sats avg 92% but c/o 3/4 dyspnea   Stairs             Wheelchair Mobility    Modified Rankin (Stroke Patients Only)       Balance                                             Cognition Arousal/Alertness: Awake/alert Behavior During Therapy: WFL for tasks assessed/performed;Flat affect Overall Cognitive Status: Within Functional Limits for tasks assessed                                 General Comments: AxO feeling "cold" but willing      Exercises      General Comments        Pertinent Vitals/Pain Pain Assessment: Faces Faces Pain Scale: Hurts little more Pain Location: ABD surgical site Pain Descriptors / Indicators: Grimacing;Discomfort Pain Intervention(s): Monitored during session    Home Living                      Prior Function            PT Goals (current goals can now be found in the care plan section) Progress towards PT goals: Progressing toward goals    Frequency    Min 3X/week      PT Plan Current plan remains appropriate    Co-evaluation              AM-PAC PT "6 Clicks" Mobility   Outcome Measure  Help needed turning from your back to your side while in a flat bed without using bedrails?:  A Little Help needed moving from lying on your back to sitting on the side of a flat bed without using bedrails?: A Little Help needed moving to and from a bed to a chair (including a wheelchair)?: A Little Help needed standing up from a chair using your arms (e.g., wheelchair or bedside chair)?: A Little Help needed to walk in hospital room?: A Lot Help needed climbing 3-5 steps with a railing? : A Lot 6 Click Score: 16    End of Session Equipment Utilized During Treatment: Gait belt Activity Tolerance: Patient tolerated treatment well Patient left: in bed;with call bell/phone within reach;with bed alarm set Nurse Communication: Mobility status PT Visit Diagnosis: Other abnormalities of gait and mobility (R26.89);Muscle weakness (generalized) (M62.81);Difficulty in walking, not elsewhere classified (R26.2)     Time: 8099-8338 PT Time Calculation (min) (ACUTE ONLY): 25  min  Charges:  $Gait Training: 8-22 mins $Therapeutic Activity: 8-22 mins                     Felecia Shelling  PTA Acute  Rehabilitation Services Pager      [phone] Office      [phone]

## 2020-02-12 LAB — COMPREHENSIVE METABOLIC PANEL
ALT: 172 U/L — ABNORMAL HIGH (ref 0–44)
AST: 102 U/L — ABNORMAL HIGH (ref 15–41)
Albumin: 2.5 g/dL — ABNORMAL LOW (ref 3.5–5.0)
Alkaline Phosphatase: 241 U/L — ABNORMAL HIGH (ref 38–126)
Anion gap: 9 (ref 5–15)
BUN: 24 mg/dL — ABNORMAL HIGH (ref 8–23)
CO2: 26 mmol/L (ref 22–32)
Calcium: 8.3 mg/dL — ABNORMAL LOW (ref 8.9–10.3)
Chloride: 98 mmol/L (ref 98–111)
Creatinine, Ser: 0.64 mg/dL (ref 0.61–1.24)
GFR, Estimated: >60 mL/min (ref >60)
Glucose, Bld: 106 mg/dL — ABNORMAL HIGH (ref 70–99)
Potassium: 4.3 mmol/L (ref 3.5–5.1)
Sodium: 133 mmol/L — ABNORMAL LOW (ref 135–145)
Total Bilirubin: 0.5 mg/dL (ref 0.3–1.2)
Total Protein: 6.1 g/dL — ABNORMAL LOW (ref 6.5–8.1)

## 2020-02-12 LAB — PHOSPHORUS: Phosphorus: 3.1 mg/dL (ref 2.5–4.6)

## 2020-02-12 LAB — GLUCOSE, CAPILLARY: Glucose-Capillary: 133 mg/dL — ABNORMAL HIGH (ref 70–99)

## 2020-02-12 LAB — MAGNESIUM: Magnesium: 2.1 mg/dL (ref 1.7–2.4)

## 2020-02-12 MED ORDER — TRAVASOL 10 % IV SOLN
INTRAVENOUS | Status: AC
  Filled 2020-02-12: qty 936

## 2020-02-12 NOTE — Progress Notes (Signed)
PHARMACY - TOTAL PARENTERAL NUTRITION CONSULT NOTE   Indication: Bowel obstruction  Patient Measurements: Height: 6' 1"  (185.4 cm) Weight: 81.6 kg (179 lb 14.3 oz) IBW/kg (Calculated) : 79.9 TPN AdjBW (KG): 81.6 Body mass index is 23.73 kg/m.  Assessment: Patient is a 74 y.o M presented to the ED on 12/7 with abdominal pain.  Abd CT showed obstruction secondary to right inguinal hernia.  He underwent hernia repair on 12/7.  He subsequently developed n/v s/p procedure, placed on NPO, and NG placed on 12/13. Pharmacy consulted on 12/15 to dose TPN.  Glucose / Insulin: Hx pre-diabetes; CBGs well controlled, 0 units SSI required in past 24h - PTA metformin held Electrolytes:  Na remains borderline low; others stable WNL; K and Mg at goal for ileus Renal: SCr stable/WNL Hepatic: LFTs up 2-3x ULN 12/24; normal on 12/20; suspect related to TPN, less likely related to surgery given delay in rise. AST/ALT trended down yesterday after adjustment of TPN day prior, now trending back up. Alk Phos trending up. Tbili WNL; albumin remains low at 2.5 Prealbumin: low but improving TG: trending up since starting TPN, now slightly elevated (12/24) I/O: I/O 2293/1600 - MIVF: NS at 40 ml/hr GI Imaging: - 12/7 abd CT: right inguinal hernia causing some degree of obstruction - 12/12 abd CT: persistent SBO vs ileus (favor obstruction) - 12/17 abd CT: postoperative ileus; nonspecific wall thickening involving the descending colon; colonic diverticulosis without diverticulitis. Surgeries / Procedures:  - 12/7: repair of inguinal incarcerated hernia with mesh - 12/13: NG placement - 12/15: ex lap, LOA  Central access: PICC double lumen (12/16) TPN start date: 12/16  Nutritional Goals:   Dietician  Recommendations  (12/23) Kcal:  2050-2250, Protein:  105-115g, Fluid:  2L/day  Keep K >/= 4 and Mg >/= 2 with ileus  Current Nutrition:  TPN, clear liquids  Plan:  At 1800:  Continue reduced TPN at 60  ml/hr with adjusted formula from 12/24 (to effectively cut dextrose and lipids by ~50% in the setting of rising LFTs and TGs; maintaining protein at ~90% of goal)  Electrolytes in TPN: no changes (totals reduced w/TPN rate reduction)  Na 150 mEq/L (max)  K 45 mEq/L  Ca 5 mEq/L  Mg 5 mEq/L  Phos 20 mmol/L  Cl:Ac ratio: max Cl  Add standard MVI and trace elements to TPN  Reduce sensitive SSI to once in the evenings (has SBG most mornings)  Continue NS at 40 ml/hr  Monitor TPN labs on Mon/Thurs   Lindell Spar, PharmD, BCPS Clinical Pharmacist  02/12/2020, 11:32 AM

## 2020-02-12 NOTE — Progress Notes (Signed)
Patient ID: Patrick Lam, male   DOB: [birth year], 74 y.o.   MRN: [id number]   Acute Care Surgery Service Progress Note:    Chief Complaint/Subjective: Tolerated NG clamp and clears Reports flatus and liquid BM   Objective: Vital signs in last 24 hours: Temp:  [97.6 F (36.4 C)-98.6 F (37 C)] 98.3 F (36.8 C) (12/26 0536) Pulse Rate:  [95-108] 100 (12/26 0536) Resp:  [17-21] 17 (12/26 0536) BP: (117-132)/(68-76) 132/68 (12/26 0536) SpO2:  [90 %-94 %] 93 % (12/26 0536) Last BM Date: 02/10/20  Intake/Output from previous day: 12/25 0701 - 12/26 0700 In: 2292.9 [P.O.:120; I.V.:2072.9; IV Piggyback:100] Out: 1600 [Urine:1600] Intake/Output this shift: No intake/output data recorded.  Lungs: cta, nonlabored  Cardiovascular: reg  Abd: soft, nd, nt  Extremities: no edema, +SCDs  Neuro: alert, nonfocal  Lab Results: CBC  Recent Labs    02/10/20 0318  WBC 11.6*  HGB 13.2  HCT 40.7  PLT 508*   BMET Recent Labs    02/11/20 0348 02/12/20 0314  NA 133* 133*  K 4.4 4.3  CL 98 98  CO2 27 26  GLUCOSE 103* 106*  BUN 23 24*  CREATININE 0.63 0.64  CALCIUM 8.5* 8.3*   LFT Hepatic Function Latest Ref Rng & Units 02/12/2020 02/11/2020 02/10/2020  Total Protein 6.5 - 8.1 g/dL 6.1(L) 6.2(L) 6.5  Albumin 3.5 - 5.0 g/dL 2.5(L) 2.6(L) 2.7(L)  AST 15 - 41 U/L 102(H) 94(H) 119(H)  ALT 0 - 44 U/L 172(H) 158(H) 190(H)  Alk Phosphatase 38 - 126 U/L 241(H) 191(H) 177(H)  Total Bilirubin 0.3 - 1.2 mg/dL 0.5 0.4 0.2(L)   PT/INR No results for input(s): LABPROT, INR in the last 72 hours. ABG No results for input(s): PHART, HCO3 in the last 72 hours.  Invalid input(s): PCO2, PO2  Studies/Results:  Anti-infectives: Anti-infectives (From admission, onward)   Start     Dose/Rate Route Frequency Ordered Stop   02/04/20 1400  anidulafungin (ERAXIS) 100 mg in sodium chloride 0.9 % 100 mL IVPB        100 mg 78 mL/hr over 100 Minutes Intravenous Every 24 hours 02/03/20 1241  02/09/20 1641   02/03/20 1400  piperacillin-tazobactam (ZOSYN) IVPB 3.375 g  Status:  Discontinued        3.375 g 12.5 mL/hr over 240 Minutes Intravenous Every 8 hours 02/03/20 1237 02/03/20 1239   02/03/20 1400  anidulafungin (ERAXIS) 200 mg in sodium chloride 0.9 % 200 mL IVPB        200 mg 78 mL/hr over 200 Minutes Intravenous  Once 02/03/20 1241 02/03/20 2024   02/03/20 1400  meropenem (MERREM) 1 g in sodium chloride 0.9 % 100 mL IVPB        1 g 200 mL/hr over 30 Minutes Intravenous Every 8 hours 02/03/20 1301 02/09/20 2215   02/03/20 1330  ceFEPIme (MAXIPIME) 2 g in sodium chloride 0.9 % 100 mL IVPB  Status:  Discontinued        2 g 200 mL/hr over 30 Minutes Intravenous  Once 02/03/20 1237 02/03/20 1239   02/01/20 1345  ceFAZolin (ANCEF) 2-4 GM/100ML-% IVPB       Note to Pharmacy: Georgena Spurling   : cabinet override      02/01/20 1345 02/02/20 0159   01/25/20 0500  ceFAZolin (ANCEF) IVPB 2g/100 mL premix        2 g 200 mL/hr over 30 Minutes Intravenous Every 8 hours 01/25/20 0004 01/25/20 0537   01/24/20 2103  ceFAZolin (ANCEF) 2-4  GM/100ML-% IVPB       Note to Pharmacy: Karie Chimera   : cabinet override      01/24/20 2103 01/24/20 2134   01/24/20 2015  ceFAZolin (ANCEF) IVPB 2g/100 mL premix        2 g 200 mL/hr over 30 Minutes Intravenous  Once 01/24/20 2014 01/24/20 2134      Medications: Scheduled Meds: . bisacodyl  10 mg Rectal Daily  . Chlorhexidine Gluconate Cloth  6 each Topical Daily  . clomiPRAMINE  25 mg Oral TID  . enoxaparin (LOVENOX) injection  40 mg Subcutaneous Q24H  . insulin aspart  0-9 Units Subcutaneous Q24H  . lip balm  1 application Topical BID  . mouth rinse  15 mL Mouth Rinse BID  . metoCLOPramide (REGLAN) injection  5 mg Intravenous Q8H  . pantoprazole (PROTONIX) IV  40 mg Intravenous Q24H  . tamsulosin  0.4 mg Oral QPC supper   Continuous Infusions: . sodium chloride    . sodium chloride 40 mL/hr at 02/12/20 0600  . chlorproMAZINE  (THORAZINE) IV 12.5 mg (01/31/20 1330)  . methocarbamol (ROBAXIN) IV 1,000 mg (02/11/20 0705)  . sodium chloride    . TPN ADULT (ION) 60 mL/hr at 02/12/20 0600   PRN Meds:.acetaminophen, alum & mag hydroxide-simeth, chlorproMAZINE (THORAZINE) IV, diphenhydrAMINE, fentaNYL (SUBLIMAZE) injection, hydrALAZINE, labetalol, magic mouthwash, menthol-cetylpyridinium, methocarbamol (ROBAXIN) IV, methylphenidate, ondansetron **OR** ondansetron (ZOFRAN) IV, phenol, simethicone, sodium chloride flush, Sodium Oxybate  Assessment/Plan: Patient Active Problem List   Diagnosis Date Noted  . Acute respiratory failure ([place]) 02/05/2020  . Shock circulatory ([place]) 02/05/2020  . Incarcerated right inguinal hernia 01/24/2020  . Overweight (BMI 25.0-29.9) 02/03/2018  . 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 Ninfa Linden Laparoscopic lysis of adhesions 02/01/2020-Dr. Stark Klein - persistent ileus with high output from NGT - Reglan 5 mg TID 12/23, started clamping trials NGT 12/24, continue TPN - continue to mobilize as tolerated - continue daily suppository - film this AM showed NG in stomach, still dilated loops of SB but air also in colon.   FEN: clears , TPN, dc ng tube VTE: lovenox ID: merrem 12/17>> 02/09/20, eraxis 12/18>>02/09/20 Disposition: tolerated NG clamp for 24hrs, I reconnected to LIWS for about 20 min - really no addl output --therefore dc ng, keep on clears today,  TPN, oob.  No signs of dehydration. Monitor BP  LOS: 19 days    Leighton Ruff. Redmond Pulling, MD, FACS General, Bariatric, & Minimally Invasive Surgery [phone] [facility]

## 2020-02-13 LAB — COMPREHENSIVE METABOLIC PANEL
ALT: 113 U/L — ABNORMAL HIGH (ref 0–44)
AST: 61 U/L — ABNORMAL HIGH (ref 15–41)
Albumin: 2.4 g/dL — ABNORMAL LOW (ref 3.5–5.0)
Alkaline Phosphatase: 179 U/L — ABNORMAL HIGH (ref 38–126)
Anion gap: 5 (ref 5–15)
BUN: 19 mg/dL (ref 8–23)
CO2: 27 mmol/L (ref 22–32)
Calcium: 8.1 mg/dL — ABNORMAL LOW (ref 8.9–10.3)
Chloride: 100 mmol/L (ref 98–111)
Creatinine, Ser: 0.62 mg/dL (ref 0.61–1.24)
GFR, Estimated: >60 mL/min (ref >60)
Glucose, Bld: 97 mg/dL (ref 70–99)
Potassium: 4.3 mmol/L (ref 3.5–5.1)
Sodium: 132 mmol/L — ABNORMAL LOW (ref 135–145)
Total Bilirubin: 0.5 mg/dL (ref 0.3–1.2)
Total Protein: 5.8 g/dL — ABNORMAL LOW (ref 6.5–8.1)

## 2020-02-13 LAB — PREALBUMIN: Prealbumin: 24.2 mg/dL (ref 18–38)

## 2020-02-13 LAB — DIFFERENTIAL
Abs Immature Granulocytes: 0.06 10*3/uL (ref 0.00–0.07)
Basophils Absolute: 0.1 10*3/uL (ref 0.0–0.1)
Basophils Relative: 1 %
Eosinophils Absolute: 0.2 10*3/uL (ref 0.0–0.5)
Eosinophils Relative: 2 %
Immature Granulocytes: 1 %
Lymphocytes Relative: 25 %
Lymphs Abs: 2.2 10*3/uL (ref 0.7–4.0)
Monocytes Absolute: 0.9 10*3/uL (ref 0.1–1.0)
Monocytes Relative: 10 %
Neutro Abs: 5.4 10*3/uL (ref 1.7–7.7)
Neutrophils Relative %: 61 %

## 2020-02-13 LAB — CBC
HCT: 34.8 % — ABNORMAL LOW (ref 39.0–52.0)
Hemoglobin: 11.2 g/dL — ABNORMAL LOW (ref 13.0–17.0)
MCH: 24.7 pg — ABNORMAL LOW (ref 26.0–34.0)
MCHC: 32.2 g/dL (ref 30.0–36.0)
MCV: 76.7 fL — ABNORMAL LOW (ref 80.0–100.0)
Platelets: 492 10*3/uL — ABNORMAL HIGH (ref 150–400)
RBC: 4.54 MIL/uL (ref 4.22–5.81)
RDW: 15 % (ref 11.5–15.5)
WBC: 8.7 10*3/uL (ref 4.0–10.5)
nRBC: 0 % (ref 0.0–0.2)

## 2020-02-13 LAB — PHOSPHORUS: Phosphorus: 3.3 mg/dL (ref 2.5–4.6)

## 2020-02-13 LAB — MAGNESIUM: Magnesium: 1.9 mg/dL (ref 1.7–2.4)

## 2020-02-13 LAB — TRIGLYCERIDES: Triglycerides: 126 mg/dL (ref <150)

## 2020-02-13 LAB — GLUCOSE, CAPILLARY: Glucose-Capillary: 112 mg/dL — ABNORMAL HIGH (ref 70–99)

## 2020-02-13 MED ORDER — ENSURE ENLIVE PO LIQD
237.0000 mL | Freq: Three times a day (TID) | ORAL | Status: DC
  Administered 2020-02-13 – 2020-02-15 (×6): 237 mL via ORAL

## 2020-02-13 MED ORDER — TRAVASOL 10 % IV SOLN
INTRAVENOUS | Status: DC
  Filled 2020-02-13: qty 936

## 2020-02-13 NOTE — Progress Notes (Signed)
Patient ID: Patrick Lam, male   DOB: October 19, 1945, 74 y.o.   MRN: [id number]   Acute Care Surgery Service Progress Note:    Chief Complaint/Subjective: States his abdominal distention is improving. Tolerating CLD, states he had had a watery, non-bloody BM yesterday. Having flatus. Denies nausea/vomiting. States he walked in the hallway yesterday.   Breakfast tray in front of him - ate 100% broth, 100% of a juice sipping on coffee  Objective: Vital signs in last 24 hours: Temp:  [97.7 F (36.5 C)-98 F (36.7 C)] 97.7 F (36.5 C) (12/26 2027) Pulse Rate:  [89-96] 96 (12/27 0536) Resp:  [16-20] 17 (12/27 0536) BP: (106-133)/(53-74) 106/53 (12/27 0536) SpO2:  [90 %-95 %] 90 % (12/27 0536) Last BM Date: 02/12/20  Intake/Output from previous day: 12/26 0701 - 12/27 0700 In: 2360.2 [P.O.:250; I.V.:2110.2] Out: 350 [Urine:350] Intake/Output this shift: No intake/output data recorded.   Gen: somnolent but arouses to voice.   Lungs: cta, nonlabored  Cardiovascular: reg  Abd: soft, mild upper abdominal fullness, nt  Extremities: no edema, +SCDs  Neuro: alert, nonfocal  Lab Results: CBC  Recent Labs    02/13/20 0357  WBC 8.7  HGB 11.2*  HCT 34.8*  PLT 492*   BMET Recent Labs    02/12/20 0314 02/13/20 0357  NA 133* 132*  K 4.3 4.3  CL 98 100  CO2 26 27  GLUCOSE 106* 97  BUN 24* 19  CREATININE 0.64 0.62  CALCIUM 8.3* 8.1*   LFT Hepatic Function Latest Ref Rng & Units 02/13/2020 02/12/2020 02/11/2020  Total Protein 6.5 - 8.1 g/dL 5.8(L) 6.1(L) 6.2(L)  Albumin 3.5 - 5.0 g/dL 2.4(L) 2.5(L) 2.6(L)  AST 15 - 41 U/L 61(H) 102(H) 94(H)  ALT 0 - 44 U/L 113(H) 172(H) 158(H)  Alk Phosphatase 38 - 126 U/L 179(H) 241(H) 191(H)  Total Bilirubin 0.3 - 1.2 mg/dL 0.5 0.5 0.4   PT/INR No results for input(s): LABPROT, INR in the last 72 hours. ABG No results for input(s): PHART, HCO3 in the last 72 hours.  Invalid input(s): PCO2,  PO2  Studies/Results:  Anti-infectives: Anti-infectives (From admission, onward)   Start     Dose/Rate Route Frequency Ordered Stop   02/04/20 1400  anidulafungin (ERAXIS) 100 mg in sodium chloride 0.9 % 100 mL IVPB        100 mg 78 mL/hr over 100 Minutes Intravenous Every 24 hours 02/03/20 1241 02/09/20 1641   02/03/20 1400  piperacillin-tazobactam (ZOSYN) IVPB 3.375 g  Status:  Discontinued        3.375 g 12.5 mL/hr over 240 Minutes Intravenous Every 8 hours 02/03/20 1237 02/03/20 1239   02/03/20 1400  anidulafungin (ERAXIS) 200 mg in sodium chloride 0.9 % 200 mL IVPB        200 mg 78 mL/hr over 200 Minutes Intravenous  Once 02/03/20 1241 02/03/20 2024   02/03/20 1400  meropenem (MERREM) 1 g in sodium chloride 0.9 % 100 mL IVPB        1 g 200 mL/hr over 30 Minutes Intravenous Every 8 hours 02/03/20 1301 02/09/20 2215   02/03/20 1330  ceFEPIme (MAXIPIME) 2 g in sodium chloride 0.9 % 100 mL IVPB  Status:  Discontinued        2 g 200 mL/hr over 30 Minutes Intravenous  Once 02/03/20 1237 02/03/20 1239   02/01/20 1345  ceFAZolin (ANCEF) 2-4 GM/100ML-% IVPB       Note to Pharmacy: Georgena Spurling   : cabinet override  02/01/20 1345 02/02/20 0159   01/25/20 0500  ceFAZolin (ANCEF) IVPB 2g/100 mL premix        2 g 200 mL/hr over 30 Minutes Intravenous Every 8 hours 01/25/20 0004 01/25/20 0537   01/24/20 2103  ceFAZolin (ANCEF) 2-4 GM/100ML-% IVPB       Note to Pharmacy: Karie Chimera   : cabinet override      01/24/20 2103 01/24/20 2134   01/24/20 2015  ceFAZolin (ANCEF) IVPB 2g/100 mL premix        2 g 200 mL/hr over 30 Minutes Intravenous  Once 01/24/20 2014 01/24/20 2134      Medications: Scheduled Meds: . bisacodyl  10 mg Rectal Daily  . Chlorhexidine Gluconate Cloth  6 each Topical Daily  . clomiPRAMINE  25 mg Oral TID  . enoxaparin (LOVENOX) injection  40 mg Subcutaneous Q24H  . insulin aspart  0-9 Units Subcutaneous Q24H  . lip balm  1 application Topical BID  .  mouth rinse  15 mL Mouth Rinse BID  . metoCLOPramide (REGLAN) injection  5 mg Intravenous Q8H  . pantoprazole (PROTONIX) IV  40 mg Intravenous Q24H  . tamsulosin  0.4 mg Oral QPC supper   Continuous Infusions: . sodium chloride    . sodium chloride 40 mL/hr at 02/12/20 0600  . chlorproMAZINE (THORAZINE) IV 12.5 mg (01/31/20 1330)  . methocarbamol (ROBAXIN) IV 1,000 mg (02/11/20 0705)  . sodium chloride    . TPN ADULT (ION) 60 mL/hr at 02/12/20 1754   PRN Meds:.acetaminophen, alum & mag hydroxide-simeth, chlorproMAZINE (THORAZINE) IV, diphenhydrAMINE, fentaNYL (SUBLIMAZE) injection, hydrALAZINE, labetalol, magic mouthwash, menthol-cetylpyridinium, methocarbamol (ROBAXIN) IV, methylphenidate, ondansetron **OR** ondansetron (ZOFRAN) IV, phenol, simethicone, sodium chloride flush, Sodium Oxybate  Assessment/Plan: Patient Active Problem List   Diagnosis Date Noted  . Acute respiratory failure ([place]) 02/05/2020  . Shock circulatory ([place]) 02/05/2020  . Incarcerated right inguinal hernia 01/24/2020  . Overweight (BMI 25.0-29.9) 02/03/2018  . 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 Ninfa Linden Laparoscopic lysis of adhesions 02/01/2020-Dr. Stark Klein - prolonged post-operative ileus improving, NGT removed 12/26 - advance to FLD, continue TPN - Reglan 5 mg TID 12/23 >> continue today and plan to D/C tomorrow  - continue to mobilize as tolerated - continue daily suppository  FEN: advance to FLD, ensure, continue TPN, may be able to wean TPN tomorrow 12/28 VTE: lovenox ID: merrem 12/17>> 02/09/20, eraxis 12/18>>02/09/20  Disposition: advance diet to FLD, cont  TPN, oob.  No signs of dehydration. Monitor BP   LOS: 20 days   Obie Dredge, PA-C  General & Trauma Surgery [phone] [facility]

## 2020-02-13 NOTE — Progress Notes (Signed)
Physical Therapy Treatment Patient Details Name: Patrick Lam MRN: [id number] DOB: 08-Jan-1946 Today's Date: 02/13/2020    History of Present Illness Patient is 74 y.o. male who present to [facility] on 01/24[facility]20 with c/o abdominal pain around his bellybutton that started to radiate to his right groin and testicles. Imaging in ED revealed large incarcerated Rt inguinal hernia. Patient now s/p open Rt inguinal hernia repair for incarcerated RIH on 01/24/20 and dx lap with LOA for SBO on 02/01/20. PMH significant for HTN, DM, OA, narcolepsy, and Lt TKA in 2019.    PT Comments    LOS 19 days Assisted OOB to amb in hallway.  General transfer comment: good safety cognition and use of hands to steady self.  General Gait Details: improving with his mobility but no where near his prior level of Indep no AD.  Pt required walker for stability and present with decreased activity tolerance due to fatigue and weakness.  Pt has had extensive surgery as well as a lengthy hospital stay.  Pt will need ST Rehab at SNF prior to returning home alone.   Follow Up Recommendations  SNF     Equipment Recommendations  None recommended by PT    Recommendations for Other Services       Precautions / Restrictions Precautions Precautions: Fall Precaution Comments: NG tube D/C    Mobility  Bed Mobility Overal bed mobility: Modified Independent             General bed mobility comments: self able with increased time and assist blankets/lines  Transfers Overall transfer level: Needs assistance Equipment used: Rolling walker (2 wheeled) Transfers: Sit to/from Stand Sit to Stand: Supervision;From elevated surface Stand pivot transfers: Supervision       General transfer comment: good safety cognition and use of hands to steady self  Ambulation/Gait Ambulation/Gait assistance: Supervision;Min guard Gait Distance (Feet): 220 Feet Assistive device: Rolling walker (2 wheeled) Gait Pattern/deviations:  Step-through pattern;Decreased stride length Gait velocity: decreased   General Gait Details: improving with his mobility but no where near his prior level of Indep no AD.  Pt required walker for stability and present with decreased activity tolerance due to fatigue and weakness.  Pt has had extensive surgery as well as a lengthy hospital stay.  Pt will need ST Rehab at SNF prior to returning home alone.   Stairs             Wheelchair Mobility    Modified Rankin (Stroke Patients Only)       Balance                                            Cognition Arousal/Alertness: Awake/alert Behavior During Therapy: WFL for tasks assessed/performed;Flat affect Overall Cognitive Status: Within Functional Limits for tasks assessed                                 General Comments: AxO x 3 feeling "better that tube is gone" (NG Tube)      Exercises      General Comments        Pertinent Vitals/Pain Pain Assessment: No/denies pain    Home Living                      Prior Function  PT Goals (current goals can now be found in the care plan section) Progress towards PT goals: Progressing toward goals    Frequency    Min 3X/week      PT Plan Current plan remains appropriate    Co-evaluation              AM-PAC PT "6 Clicks" Mobility   Outcome Measure  Help needed turning from your back to your side while in a flat bed without using bedrails?: A Little Help needed moving from lying on your back to sitting on the side of a flat bed without using bedrails?: A Little Help needed moving to and from a bed to a chair (including a wheelchair)?: A Little Help needed standing up from a chair using your arms (e.g., wheelchair or bedside chair)?: A Lot Help needed to walk in hospital room?: A Lot Help needed climbing 3-5 steps with a railing? : A Lot 6 Click Score: 15    End of Session Equipment Utilized During  Treatment: Gait belt Activity Tolerance: Patient tolerated treatment well Patient left: in chair Nurse Communication: Mobility status PT Visit Diagnosis: Other abnormalities of gait and mobility (R26.89);Muscle weakness (generalized) (M62.81);Difficulty in walking, not elsewhere classified (R26.2)     Time: 9937-1696 PT Time Calculation (min) (ACUTE ONLY): 26 min  Charges:  $Gait Training: 8-22 mins $Therapeutic Activity: 8-22 mins                     Felecia Shelling  PTA Acute  Rehabilitation Services Pager      [phone] Office      [phone]

## 2020-02-13 NOTE — NC FL2 (Addendum)
[place] [facility] [place] MEDICAID FL2 LEVEL OF CARE SCREENING TOOL     IDENTIFICATION  Patient Name: Patrick Lam Birthdate: May 31, 1945 Sex: male Admission Date (Current Location): 01/24/2020  [facility] and [place] Number:  Producer, television/film/video and Address:  [facility],  [address] [place] [address]      Provider Number: [id number]  Attending Physician Name and Address:  Montez Morita, Md, MD  Relative Name and Phone Number:       Current Level of Care: Hospital Recommended Level of Care: Skilled Nursing Facility Prior Approval Number:    Date Approved/Denied:   PASRR Number:   [id number]  Discharge Plan: SNF    Current Diagnoses: Patient Active Problem List   Diagnosis Date Noted  . Acute respiratory failure ([facility]) 02/05/2020  . Shock circulatory ([facility]) 02/05/2020  . Incarcerated right inguinal hernia 01/24/2020  . Overweight (BMI 25.0-29.9) 02/03/2018  . 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    Orientation RESPIRATION BLADDER Height & Weight        Normal Continent Weight: 179 lb 14.3 oz (81.6 kg) Height:  6\' 1"  (185.4 cm)  BEHAVIORAL SYMPTOMS/MOOD NEUROLOGICAL BOWEL NUTRITION STATUS      Continent Diet (See dc summary)  AMBULATORY STATUS COMMUNICATION OF NEEDS Skin   Extensive Assist Verbally Other (Comment) (Closed incisions,  3 ports- upper, mid and lower, left abdomen)                       Personal Care Assistance Level of Assistance  Bathing,Feeding,Dressing Bathing Assistance: Limited assistance Feeding assistance: Independent Dressing Assistance: Limited assistance     Functional Limitations Info  Sight,Hearing,Speech Sight Info: Impaired (Wears glasses) Hearing Info: Adequate Speech Info: Adequate    SPECIAL CARE FACTORS FREQUENCY  PT (By licensed PT)     PT Frequency: 5x/week              Contractures Contractures Info: Not present    Additional Factors Info  Code Status,Allergies Code Status Info: Full Allergies Info: NKA            Current Medications (02/13/2020):  This is the current hospital active medication list Current Facility-Administered Medications  Medication Dose Route Frequency Provider Last Rate Last Admin  . 0.9 %  sodium chloride infusion  250 mL Intravenous Continuous 02/15/2020, MD      . 0.9 %  sodium chloride infusion   Intravenous Continuous Almond Lint, RPH 40 mL/hr at 02/12/20 0600 Infusion Verify at 02/12/20 0600  . acetaminophen (TYLENOL) suppository 650 mg  650 mg Rectal Q6H PRN 02/14/20, MD      . alum & mag hydroxide-simeth (MAALOX/MYLANTA) 200-200-20 MG/5ML suspension 30 mL  30 mL Oral Q6H PRN 08-26-2000, MD   30 mL at 02/12/20 0037  . bisacodyl (DULCOLAX) suppository 10 mg  10 mg Rectal Daily 02/14/20, MD   10 mg at 02/12/20 0942  . Chlorhexidine Gluconate Cloth 2 % PADS 6 each  6 each Topical Daily 02/14/20, MD   6 each at 02/13/20 0820  . chlorproMAZINE (THORAZINE) 12.5 mg in sodium chloride 0.9 % 25 mL IVPB  12.5 mg Intravenous Q6H PRN 02/15/20, MD 50 mL/hr at 01/31/20 1330 12.5 mg at 01/31/20 1330  . clomiPRAMINE (ANAFRANIL) capsule 25 mg  25 mg Oral TID 02/02/20, MD   25 mg at 02/13/20 0818  . diphenhydrAMINE (BENADRYL) injection 12.5-25 mg  12.5-25 mg Intravenous Q6H PRN 03-19-1984, MD      . enoxaparin (  LOVENOX) injection 40 mg  40 mg Subcutaneous Q24H Karie Soda, MD   40 mg at 02/13/20 0818  . feeding supplement (ENSURE ENLIVE / ENSURE PLUS) liquid 237 mL  237 mL Oral TID WC Adam Phenix, PA-C   237 mL at 02/13/20 1114  . fentaNYL (SUBLIMAZE) injection 12.5-25 mcg  12.5-25 mcg Intravenous Q2H PRN Karie Soda, MD   25 mcg at 02/13/20 0143  . hydrALAZINE (APRESOLINE) injection 10 mg  10 mg Intravenous Q6H PRN Karie Soda, MD      . insulin aspart (novoLOG) injection 0-9 Units  0-9 Units Subcutaneous Q24H Danford Bad, RPH   1 Units at 02/12/20 2113  . labetalol (NORMODYNE) injection 5 mg  5 mg Intravenous Q6H PRN Karie Soda, MD       . lip balm (CARMEX) ointment 1 application  1 application Topical BID Karie Soda, MD   1 application at 02/13/20 0820  . magic mouthwash  15 mL Oral QID PRN Karie Soda, MD      . MEDLINE mouth rinse  15 mL Mouth Rinse BID Karie Soda, MD   15 mL at 02/13/20 0819  . menthol-cetylpyridinium (CEPACOL) lozenge 3 mg  1 lozenge Oral PRN Karie Soda, MD      . methocarbamol (ROBAXIN) 1,000 mg in dextrose 5 % 100 mL IVPB  1,000 mg Intravenous Q6H PRN Karie Soda, MD 200 mL/hr at 02/11/20 0705 1,000 mg at 02/11/20 0705  . methylphenidate (RITALIN) tablet 20 mg  20 mg Oral Daily PRN Karie Soda, MD      . metoCLOPramide (REGLAN) injection 5 mg  5 mg Intravenous Q8H Trixie Deis R, PA-C   5 mg at 02/13/20 0540  . ondansetron (ZOFRAN-ODT) disintegrating tablet 4 mg  4 mg Oral Q6H PRN Karie Soda, MD       Or  . ondansetron [facility]) injection 4 mg  4 mg Intravenous Q6H PRN Karie Soda, MD   4 mg at 01/29/20 1206  . pantoprazole (PROTONIX) injection 40 mg  40 mg Intravenous Q24H Karie Soda, MD   40 mg at 02/12/20 2113  . phenol (CHLORASEPTIC) mouth spray 2 spray  2 spray Mouth/Throat PRN Karie Soda, MD      . simethicone (MYLICON) 40 MG/0.6ML suspension 40 mg  40 mg Oral QID PRN Karie Soda, MD   40 mg at 02/11/20 2005  . sodium chloride 0.9 % bolus 1,000 mL  1,000 mL Intravenous Once Karie Soda, MD      . sodium chloride flush (NS) 0.9 % injection 10-40 mL  10-40 mL Intracatheter PRN Karie Soda, MD   10 mL at 02/11/20 0349  . Sodium Oxybate SOLN 4,500 mg  4,500 mg Oral Daily PRN Karie Soda, MD   4,500 mg at 02/13/20 0428  . tamsulosin (FLOMAX) capsule 0.4 mg  0.4 mg Oral QPC supper Sherrie George, PA-C   0.4 mg at 02/12/20 1738  . TPN ADULT (ION)   Intravenous Continuous TPN Jamse Mead, RPH 60 mL/hr at 02/12/20 1754 New Bag at 02/12/20 1754  . TPN ADULT (ION)   Intravenous Continuous TPN [place], Jacqulyn Cane, [facility]         Discharge Medications: Please see  discharge summary for a list of discharge medications.  Relevant Imaging Results:  Relevant Lab Results:   Additional Information ssn:[id number]  Coralyn Helling, LCSW

## 2020-02-13 NOTE — Progress Notes (Signed)
PHARMACY - TOTAL PARENTERAL NUTRITION CONSULT NOTE   Indication: Bowel obstruction  Patient Measurements: Height: 6' 1"  (185.4 cm) Weight: 81.6 kg (179 lb 14.3 oz) IBW/kg (Calculated) : 79.9 TPN AdjBW (KG): 81.6 Body mass index is 23.73 kg/m.  Assessment: Patient is a 74 y.o M presented to the ED on 12/7 with abdominal pain.  Abd CT showed obstruction secondary to right inguinal hernia.  He underwent hernia repair on 12/7.  He subsequently developed n/v s/p procedure, placed on NPO, and NG placed on 12/13. Pharmacy consulted on 12/15 to dose TPN.  Glucose / Insulin: Hx pre-diabetes; CBGs well controlled, 1 unit SSI required in past 24h - PTA metformin held Electrolytes:  Na remains low; others stable WNL; Mag 1.9, K at goal for ileus; CorrCa 9.38 Renal: SCr/BUN remains stable, WNL Hepatic: LFTs increased from WNL to 2-3x ULN 12/24; slightly elevated but decreased on 12/27.  Suspect related to TPN, less likely related to surgery given delay in rise, improved following Dextrose reduction on 12/24.  Alk Phos trending down. Tbili WNL; albumin remains low at 2.4 Prealbumin: improved to 24 (12/27) TG: trending up since starting TPN, today decreased to 126 (12/27) I/O: I/O 2360/350  - MIVF: NS at 40 ml/hr - PO intake: 221m  - UOP decreased significantly to 350 mL/day (suspect charting may be inaccurate.  Rn today says urinal has 650 mL in it already today) - LBM 12/26 GI Imaging: - 12/7 abd CT: right inguinal hernia causing some degree of obstruction - 12/12 abd CT: persistent SBO vs ileus (favor obstruction) - 12/17 abd CT: postoperative ileus; nonspecific wall thickening involving the descending colon; colonic diverticulosis without diverticulitis. - 12/24 abd XR: Enteric tube in good position.  Stable to progressed gaseous distension of small bowel. No progression of contrast from the right colon. Surgeries / Procedures:  - 12/7: repair of inguinal incarcerated hernia with mesh - 12/13:  NG placement - 12/15: ex lap, LOA  Central access: PICC double lumen (12/16) TPN start date: 12/16  Nutritional Goals:   Dietician  Recommendations  (12/23): Kcal:  2050-2250, Protein:  105-115g, Fluid:  2L/day Keep K >/= 4 and Mg >/= 2 with ileus TPN at goal rate of 90 ml/hr provides: 2000 kcal, 108 grams of protein daily. 12/24:  Reduced rate TPN at 60 ml/hr provides: 1224 kcal, 93 grams of protein daily.   Current Nutrition:  TPN Per RN, pt tolerated a full tray of clear liquids for breakfast.  Diet advanced to full liquids.  Plan:  At 1800:  Continue reduced TPN at 60 ml/hr (decreases dextrose and lipids by ~50% in the setting of rising LFTs and TGs; maintaining protein at ~90% of goal)  Electrolytes in TPN:   Na 150 mEq/L (max)  K 45 mEq/L  Ca 5 mEq/L  Mg 7.5 mEq/L, increased  Phos 20 mmol/L  Cl:Ac ratio: max Cl  Standard MVI and trace elements to TPN  Reduce sensitive SSI to once in the evenings (has SBG most mornings)  Continue NS at 40 ml/hr  Monitor TPN labs on Mon/Thurs  Follow up tolerance of advancing diet, duration of TPN.  CGretta ArabPharmD, BCPS Clinical Pharmacist [facility] main pharmacy [phone]/27/2021 8:43 AM

## 2020-02-14 LAB — SARS CORONAVIRUS 2 (TAT 6-24 HRS): SARS Coronavirus 2: NEGATIVE

## 2020-02-14 LAB — GLUCOSE, CAPILLARY: Glucose-Capillary: 85 mg/dL (ref 70–99)

## 2020-02-14 MED ORDER — TRAVASOL 10 % IV SOLN
INTRAVENOUS | Status: AC
  Filled 2020-02-14: qty 468

## 2020-02-14 NOTE — TOC Progression Note (Signed)
Transition of Care [facility]) - Progression Note    Patient Details  Name: Patrick Lam MRN: [id number] Date of Birth: [birth year]  Transition of Care [facility]) CM/SW Contact  Armanda Heritage, RN Phone Number: 02/14/2020, 11:59 AM  Clinical Narrative:    CM spoke with patient's daughter and presented bed offers.  Daughter, Patrick Lam, Management consultant for [employer].  Facility rep notified.  Patient will need an updated Covid test for discharge, bedside RN made aware.   Expected Discharge Plan: Skilled Nursing Facility Barriers to Discharge: Continued Medical Work up  Expected Discharge Plan and Services Expected Discharge Plan: Skilled Nursing Facility In-house Referral: Clinical Social Work   Post Acute Care Choice: Skilled Nursing Facility Living arrangements for the past 2 months: Single Family Home                                       Social Determinants of Health (SDOH) Interventions    Readmission Risk Interventions Readmission Risk Prevention Plan 02/06/2020  Transportation Screening Complete  PCP or Specialist Appt within 5-7 Days Complete  Home Care Screening Complete

## 2020-02-14 NOTE — Progress Notes (Signed)
Pharmacy Note   After discussion with Hosie Spangle PA, plan to stop TPN today.   Will cut rate in half for remainder of today and then will DC subsequent TPN.    Adalberto Cole, PharmD, BCPS 02/14/2020 10:36 AM

## 2020-02-14 NOTE — Progress Notes (Signed)
Patient ID: Patrick Lam, male   DOB: 01-Mar-1945, 74 y.o.   MRN: [id number]   Acute Care Surgery Service Progress Note:    Chief Complaint/Subjective: Denies pain. States he is hungry and has been drinking almost 100% of his liquid meals without N/V. Reports mobilizing 3x yesterday. States he is having ongoing loose BMs.  Objective: Vital signs in last 24 hours: Temp:  [97.6 F (36.4 C)-99.2 F (37.3 C)] 99.2 F (37.3 C) (12/28 0636) Pulse Rate:  [87-99] 99 (12/28 0636) Resp:  [18] 18 (12/28 0636) BP: (119-126)/(63-68) 119/63 (12/28 0636) SpO2:  [93 %-94 %] 94 % (12/28 0636) Last BM Date: 02/13/20  Intake/Output from previous day: 12/27 0701 - 12/28 0700 In: 3627.2 [P.O.:1500; I.V.:2127.2] Out: 3950 [Urine:3950] Intake/Output this shift: No intake/output data recorded.   Gen: alert, NAD, cooperative  Lungs: cta, nonlabored  Cardiovascular: reg  Abd: soft, NT/ND, mild ecchymosis around R inguinal incision  Extremities: no edema, +SCDs  Neuro: alert, nonfocal  Lab Results: CBC  Recent Labs    02/13/20 0357  WBC 8.7  HGB 11.2*  HCT 34.8*  PLT 492*   BMET Recent Labs    02/12/20 0314 02/13/20 0357  NA 133* 132*  K 4.3 4.3  CL 98 100  CO2 26 27  GLUCOSE 106* 97  BUN 24* 19  CREATININE 0.64 0.62  CALCIUM 8.3* 8.1*   LFT Hepatic Function Latest Ref Rng & Units 02/13/2020 02/12/2020 02/11/2020  Total Protein 6.5 - 8.1 g/dL 5.8(L) 6.1(L) 6.2(L)  Albumin 3.5 - 5.0 g/dL 2.4(L) 2.5(L) 2.6(L)  AST 15 - 41 U/L 61(H) 102(H) 94(H)  ALT 0 - 44 U/L 113(H) 172(H) 158(H)  Alk Phosphatase 38 - 126 U/L 179(H) 241(H) 191(H)  Total Bilirubin 0.3 - 1.2 mg/dL 0.5 0.5 0.4   PT/INR No results for input(s): LABPROT, INR in the last 72 hours. ABG No results for input(s): PHART, HCO3 in the last 72 hours.  Invalid input(s): PCO2, PO2  Studies/Results:  Anti-infectives: Anti-infectives (From admission, onward)   Start     Dose/Rate Route Frequency Ordered Stop    02/04/20 1400  anidulafungin (ERAXIS) 100 mg in sodium chloride 0.9 % 100 mL IVPB        100 mg 78 mL/hr over 100 Minutes Intravenous Every 24 hours 02/03/20 1241 02/09/20 1641   02/03/20 1400  piperacillin-tazobactam (ZOSYN) IVPB 3.375 g  Status:  Discontinued        3.375 g 12.5 mL/hr over 240 Minutes Intravenous Every 8 hours 02/03/20 1237 02/03/20 1239   02/03/20 1400  anidulafungin (ERAXIS) 200 mg in sodium chloride 0.9 % 200 mL IVPB        200 mg 78 mL/hr over 200 Minutes Intravenous  Once 02/03/20 1241 02/03/20 2024   02/03/20 1400  meropenem (MERREM) 1 g in sodium chloride 0.9 % 100 mL IVPB        1 g 200 mL/hr over 30 Minutes Intravenous Every 8 hours 02/03/20 1301 02/09/20 2215   02/03/20 1330  ceFEPIme (MAXIPIME) 2 g in sodium chloride 0.9 % 100 mL IVPB  Status:  Discontinued        2 g 200 mL/hr over 30 Minutes Intravenous  Once 02/03/20 1237 02/03/20 1239   02/01/20 1345  ceFAZolin (ANCEF) 2-4 GM/100ML-% IVPB       Note to Pharmacy: Georgena Spurling   : cabinet override      02/01/20 1345 02/02/20 0159   01/25/20 0500  ceFAZolin (ANCEF) IVPB 2g/100 mL premix  2 g 200 mL/hr over 30 Minutes Intravenous Every 8 hours 01/25/20 0004 01/25/20 0537   01/24/20 2103  ceFAZolin (ANCEF) 2-4 GM/100ML-% IVPB       Note to Pharmacy: Karie Chimera   : cabinet override      01/24/20 2103 01/24/20 2134   01/24/20 2015  ceFAZolin (ANCEF) IVPB 2g/100 mL premix        2 g 200 mL/hr over 30 Minutes Intravenous  Once 01/24/20 2014 01/24/20 2134      Medications: Scheduled Meds: . bisacodyl  10 mg Rectal Daily  . Chlorhexidine Gluconate Cloth  6 each Topical Daily  . clomiPRAMINE  25 mg Oral TID  . enoxaparin (LOVENOX) injection  40 mg Subcutaneous Q24H  . feeding supplement  237 mL Oral TID WC  . insulin aspart  0-9 Units Subcutaneous Q24H  . lip balm  1 application Topical BID  . mouth rinse  15 mL Mouth Rinse BID  . pantoprazole (PROTONIX) IV  40 mg Intravenous Q24H  .  tamsulosin  0.4 mg Oral QPC supper   Continuous Infusions: . sodium chloride    . sodium chloride 40 mL/hr at 02/12/20 0600  . chlorproMAZINE (THORAZINE) IV 12.5 mg (01/31/20 1330)  . methocarbamol (ROBAXIN) IV 1,000 mg (02/11/20 0705)  . sodium chloride    . TPN ADULT (ION) 60 mL/hr at 02/13/20 1729   PRN Meds:.acetaminophen, alum & mag hydroxide-simeth, chlorproMAZINE (THORAZINE) IV, diphenhydrAMINE, fentaNYL (SUBLIMAZE) injection, hydrALAZINE, labetalol, magic mouthwash, menthol-cetylpyridinium, methocarbamol (ROBAXIN) IV, methylphenidate, ondansetron **OR** ondansetron (ZOFRAN) IV, phenol, simethicone, sodium chloride flush, Sodium Oxybate  Assessment/Plan: Patient Active Problem List   Diagnosis Date Noted  . Acute respiratory failure ([place]) 02/05/2020  . Shock circulatory ([place]) 02/05/2020  . Incarcerated right inguinal hernia 01/24/2020  . Overweight (BMI 25.0-29.9) 02/03/2018  . 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 Ninfa Linden Laparoscopic lysis of adhesions 02/01/2020-Dr. Stark Klein - prolonged post-operative ileus improving, NGT removed 12/26 - advance to Soft diet, wean TPN - continue to mobilize as tolerated - continue daily suppository  FEN: SOFT diet, ensure, wean TPN today VTE: lovenox ID: merrem 12/17>> 02/09/20, eraxis 12/18>>02/09/20  Disposition: advance diet to Soft, wean TPN per pharmacy, stop reglan.  Anticipate patient will be medically ready for discharge to SNF in 24-48 hours   LOS: 21 days   Obie Dredge, [place] [facility] [phone] [facility]

## 2020-02-15 LAB — BASIC METABOLIC PANEL
Anion gap: 8 (ref 5–15)
BUN: 15 mg/dL (ref 8–23)
CO2: 28 mmol/L (ref 22–32)
Calcium: 8.2 mg/dL — ABNORMAL LOW (ref 8.9–10.3)
Chloride: 100 mmol/L (ref 98–111)
Creatinine, Ser: 0.64 mg/dL (ref 0.61–1.24)
GFR, Estimated: >60 mL/min (ref >60)
Glucose, Bld: 106 mg/dL — ABNORMAL HIGH (ref 70–99)
Potassium: 3.9 mmol/L (ref 3.5–5.1)
Sodium: 136 mmol/L (ref 135–145)

## 2020-02-15 LAB — GLUCOSE, CAPILLARY: Glucose-Capillary: 104 mg/dL — ABNORMAL HIGH (ref 70–99)

## 2020-02-15 MED ORDER — METHOCARBAMOL 500 MG PO TABS
500.0000 mg | ORAL_TABLET | Freq: Three times a day (TID) | ORAL | Status: AC | PRN
Start: 2020-02-15 — End: ?

## 2020-02-15 MED ORDER — ENSURE ENLIVE PO LIQD: 237.0000 mL | Freq: Three times a day (TID) | ORAL | 12 refills | Status: AC

## 2020-02-15 NOTE — Progress Notes (Signed)
Called SNF gave report to [facility], questions answered. IV/PICC lines removed. Meds retrieved from pharmacy.

## 2020-02-15 NOTE — TOC Transition Note (Signed)
Transition of Care [facility]) - CM/SW Discharge Note   Patient Details  Name: Patrick Lam MRN: [id number] Date of Birth: Feb 12, 1946  Transition of Care [facility]) CM/SW Contact:  Amada Jupiter, LCSW Phone Number: 02/15/2020, 10:57 AM   Clinical Narrative:    Pt medically cleared for SNF discharge today.  Pt and daughter aware and agreeable.  Pt has accepted SNF bed at [facility] in [place].  [facility] has been called for transport.  RN to call report to [phone].  No further TOC needs.   Final next level of care: Skilled Nursing Facility Barriers to Discharge: Barriers Resolved   Patient Goals and CMS Choice Patient states their goals for this hospitalization and ongoing recovery are:: to eventually be able to return home - realistic that rehab will likely be needed first CMS Medicare.gov Compare Post Acute Care list provided to:: Patient Choice offered to / list presented to : Patient  Discharge Placement              Patient [facility] bed at: [facility] Patient to be transferred to facility by: [facility] Name of family member notified: daughter, Bonita Quin Patient and family notified of of transfer: 02/15/20  Discharge Plan and Services In-house Referral: Clinical Social Work   Post Acute Care Choice: Skilled Nursing Facility          DME Arranged: N/A DME Agency: NA                  Social Determinants of Health (SDOH) Interventions     Readmission Risk Interventions Readmission Risk Prevention Plan 02/06/2020  Transportation Screening Complete  PCP or Specialist Appt within 5-7 Days Complete  Home Care Screening Complete

## 2020-02-15 NOTE — Discharge Summary (Signed)
[facility] [place] [facility] Discharge Summary   Patient ID: Patrick Lam MRN: [id number] DOB/AGE: 1946/01/18 74 y.o.  Admit date: 01/24/2020 Discharge date: 02/15/2020  Admitting Diagnosis: Incarcerated Right inguinal hernia   Discharge Diagnosis Incarcerated right inguinal hernia  SBO Ileus   Consultants CCM  Imaging: No results found.  Procedures Dr. Magnus Ivan (01/24/20) - Open right inguinal hernia repair  Dr. Donell Beers (02/01/20) - laparoscopic lysis of adhesions  HPI: Patient is a pleasant 74 year old gentleman who presented with a painful bulge in his right groin. He just noticed it day of admission. He had no previous history of an inguinal hernia. His pain started around 11:00 that morning and was in the central abdomen and then moved to the groin. Per his daughter's report, he does cough a lot. He had no nausea or vomiting. He described a sharp 10 out of 10 pain. He denied fevers or chills. He had no cardiac history. He had difficulty voiding because of his pain and foley catheter was placed.  Hospital Course:  Patient was admitted and taken to the OR for hernia repair as listed above. Foley removed on POD#1 and patient was able to void. Patient watched post-operatively and did not have bowel function, initially felt to be post-operative ileus. CT repeated 12/12 and showed possible ileus vs SBO. NGT placed for decompression 12/13. SBO protocol initiated 12/14, patient failed to resolve and was taken back to the OR 12/15 as listed above. Patient started on TPN post-operatively. Patient developed tachycardia and hypotension 12/17, was transferred to ICU and critical care consulted. Repeat CT showed ileus and no signs of bowel injury or abscess. Hypotension resolved with resuscitation. Patient started having some return in bowel function and diet was advanced slowly as tolerated. TPN was weaned off with advancement in diet. On 02/15/20 patient was tolerating a soft diet and having bowel  function. PICC line to be removed on day of discharge. Patient cleared for discharge to SNF on 02/15/20.   Physical Exam: General: pleasant, WD, WN male who is laying in bed in NAD Heart: regular, rate, and rhythm.   Lungs: CTAB, no wheezes, rhonchi, or rales noted.  Respiratory effort nonlabored Abd: soft, NT, ND, +BS, R groin incision c/d/i with resolving ecchymosis MS: all 4 extremities are symmetrical with no cyanosis, clubbing, or edema. Skin: warm and dry with no masses, lesions, or rashes Neuro: Cranial nerves 2-12 grossly intact, sensation is normal throughout Psych: A&Ox3 with an appropriate affect.   I or a member of my team have reviewed this patient in the Controlled Substance Database.   Allergies as of 02/15/2020   No Known Allergies     Medication List    TAKE these medications   acetaminophen 500 MG tablet Commonly known as: TYLENOL Take 1,000 mg by mouth every 6 (six) hours as needed for mild pain, moderate pain, fever or headache.   clomiPRAMINE 25 MG capsule Commonly known as: ANAFRANIL Take 25 mg by mouth 3 (three) times daily.   diltiazem 240 MG 24 hr capsule Commonly known as: DILACOR XR Take 240 mg by mouth daily.   feeding supplement Liqd Take 237 mLs by mouth 3 (three) times daily with meals.   GARLIC OIL PO Take 1 tablet by mouth daily.   hydrochlorothiazide 25 MG tablet Commonly known as: HYDRODIURIL Take 25 mg by mouth daily.   losartan 25 MG tablet Commonly known as: COZAAR Take 25 mg by mouth daily.   meloxicam 7.5 MG tablet Commonly known as: MOBIC Take 7.5 mg  by mouth daily as needed for pain.   metFORMIN 500 MG tablet Commonly known as: GLUCOPHAGE Take 500 mg by mouth daily with breakfast.   methocarbamol 500 MG tablet Commonly known as: Robaxin Take 1 tablet (500 mg total) by mouth every 8 (eight) hours as needed for muscle spasms.   methylphenidate 20 MG tablet Commonly known as: RITALIN Take 20 mg by mouth daily as  needed (narcolepsy).   multivitamin with minerals Tabs tablet Take 1 tablet by mouth daily.   omeprazole 20 MG capsule Commonly known as: PRILOSEC Take 20 mg by mouth daily.   rosuvastatin 10 MG tablet Commonly known as: CRESTOR Take 10 mg by mouth daily.   vitamin B-12 500 MCG tablet Commonly known as: CYANOCOBALAMIN Take 500 mcg by mouth daily.   Xyrem 500 MG/ML Soln Generic drug: Sodium Oxybate Take 4.5 g by mouth 2 (two) times daily. During the night         Contact information for follow-up providers    Abigail Miyamoto, MD. Go on 03/05/2020.   Specialty: General Surgery Why: Arrive by 10:00 AM for follow up appointment scheduled at 10:10 AM. Please bring photo ID and insurance information. Contact information: [address] [place] [address] [phone]            Contact information for after-discharge care    Destination    [facility] Preferred SNF .   Service: Skilled Nursing Contact information: [address] [place] [address] [phone]                  Signed: Juliet Rude , [facility] 02/15/2020, 9:28 AM Please see Amion for pager number during day hours 7:00am-4:30pm

## 2021-06-03 LAB — COLOGUARD: COLOGUARD: POSITIVE — AB

## 2021-11-29 IMAGING — DX DG ABD PORTABLE 1V
1 series · 1 of 1 positions shown · non-contrast
Comparison: 01/30/2020

CLINICAL DATA: Small bowel obstruction

EXAM:
PORTABLE ABDOMEN - 1 VIEW

[abdomen kub]
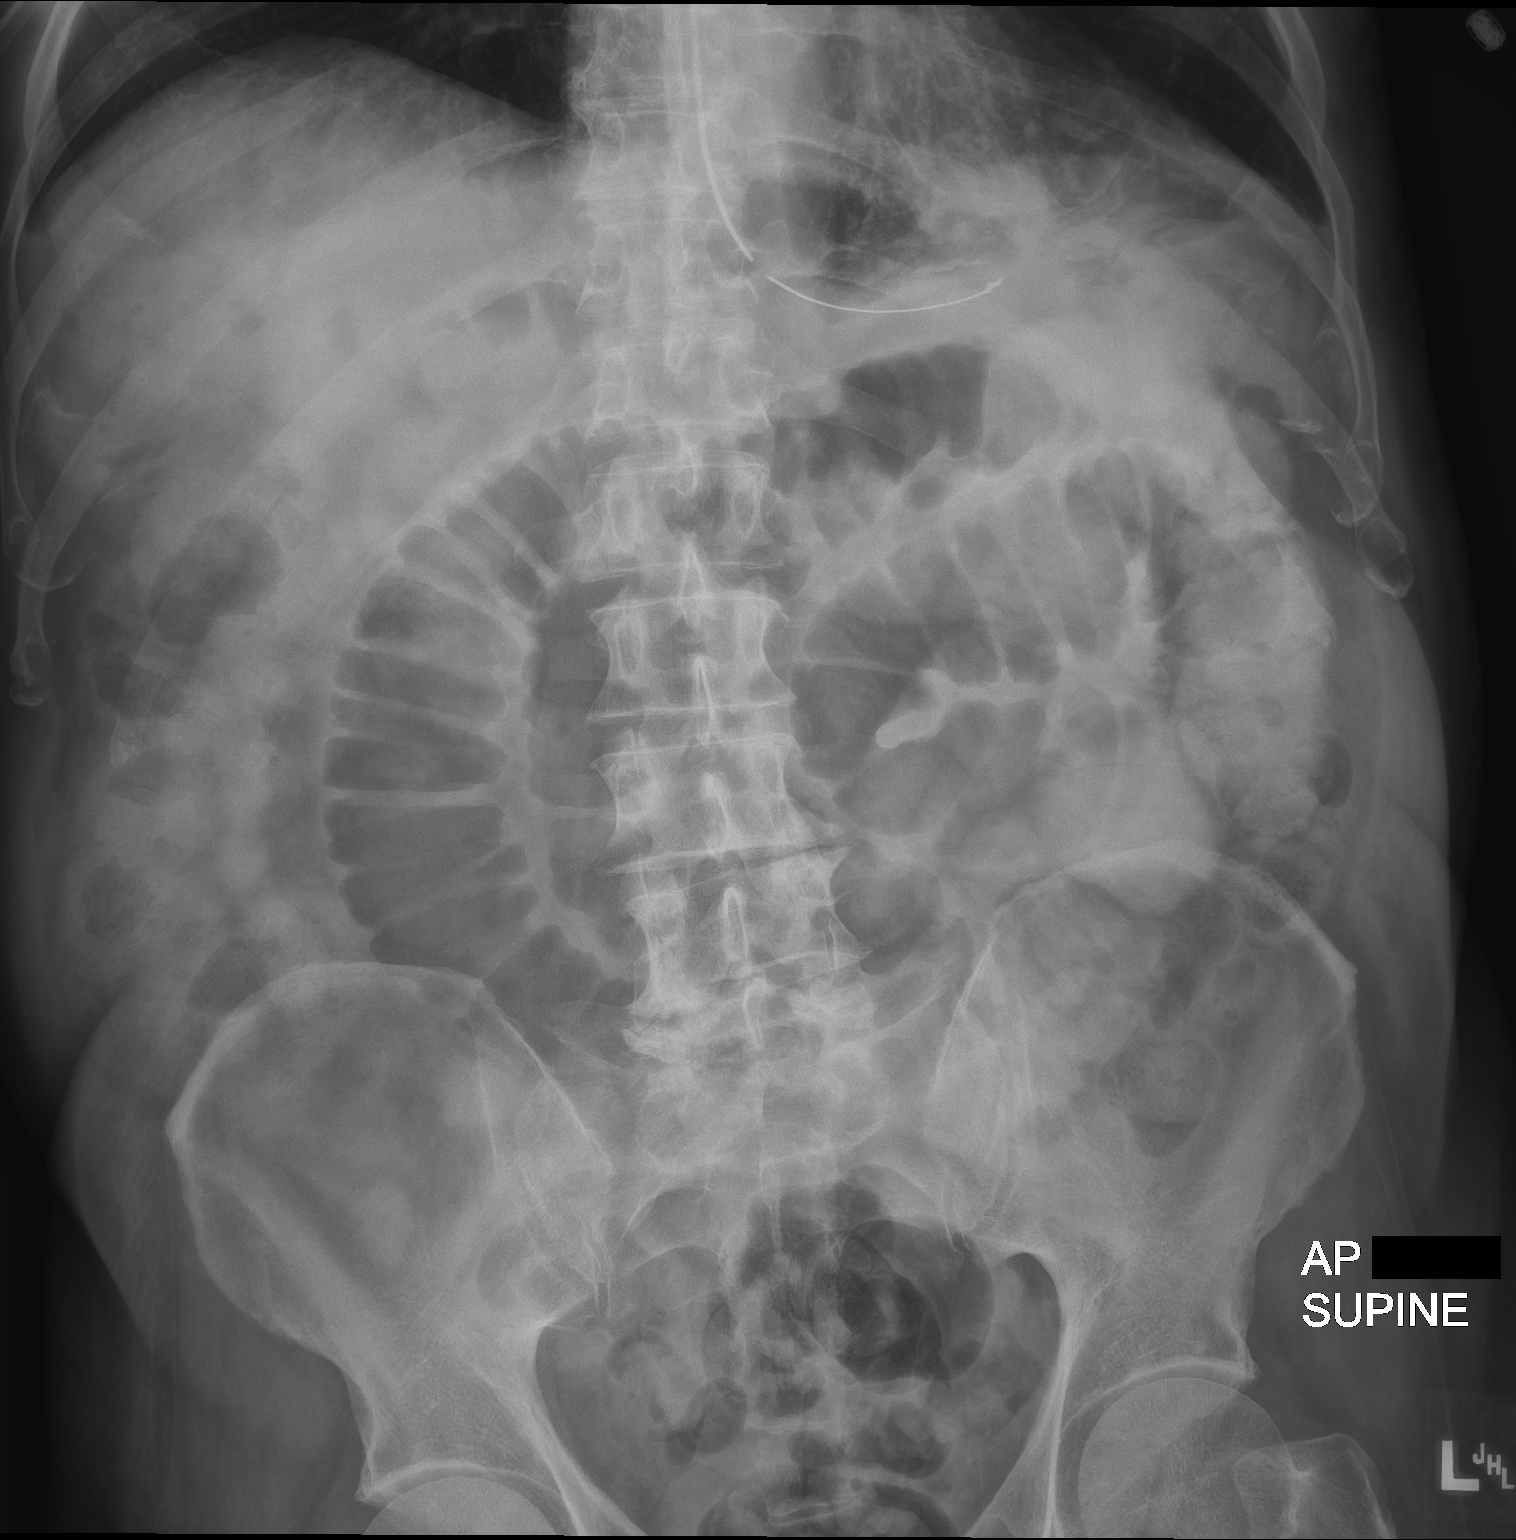

[1 of 1 positions shown; findings below may reference images not displayed]

FINDINGS: NG tube tip remains in the proximal stomach. Dilated central small
bowel loops again noted, stable or slightly worsened since prior
study. No organomegaly or free air.
IMPRESSION: Continued dilated central small bowel loops concerning for
high-grade small bowel obstruction, stable or slightly worsened
since prior study.

## 2021-12-02 IMAGING — CT CT ANGIO CHEST
5 of 12 series · 15 of 46 positions shown · IV contrast (OMNIPAQUE)
Comparison: Radiograph earlier today. Preoperative abdominal CT
01/29/2020

CLINICAL DATA: PE suspected, high prob; Abdominal abscess/infection
suspected

Hypotension and tachycardia 2 days post lysis of adhesions.
EXAM:
CT ANGIOGRAPHY CHEST
CT ABDOMEN AND PELVIS WITH CONTRAST
TECHNIQUE: Multidetector CT imaging of the chest was performed using the
standard protocol during bolus administration of intravenous
contrast. Multiplanar CT image reconstructions and MIPs were
obtained to evaluate the vascular anatomy. Multidetector CT imaging
of the abdomen and pelvis was performed using the standard protocol
during bolus administration of intravenous contrast.
CONTRAST:  100mL OMNIPAQUE IOHEXOL 350 MG/ML SOLN

[Series 5: axial st · axial · 0.73mm/px · z∈[-114,+6]mm · 2 of 120 slices shown (1 of 2)]
[im 40/120  lung]
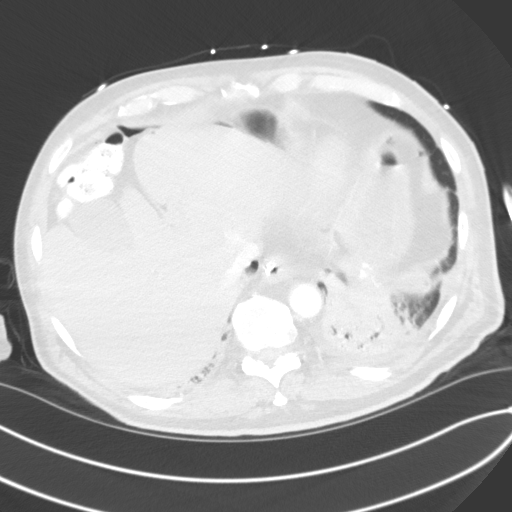
[im 80/120  lung]
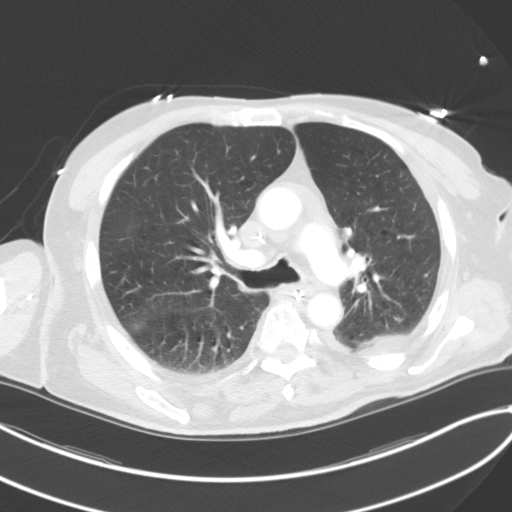

[Series 6: thins · axial · 0.73mm/px · z∈[-205,+98]mm · 8 of 359 slices shown]
[im 28/359  lung]
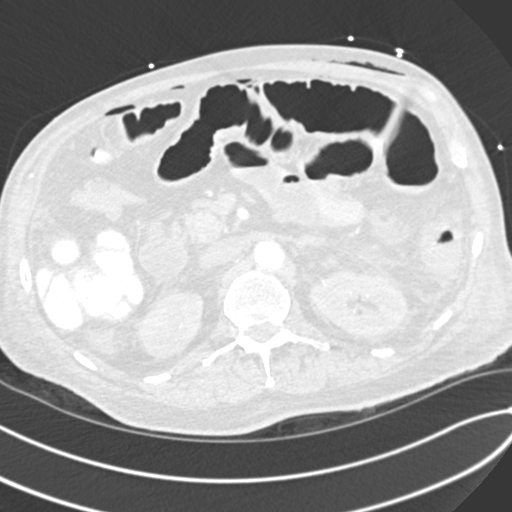
[im 83/359  lung]
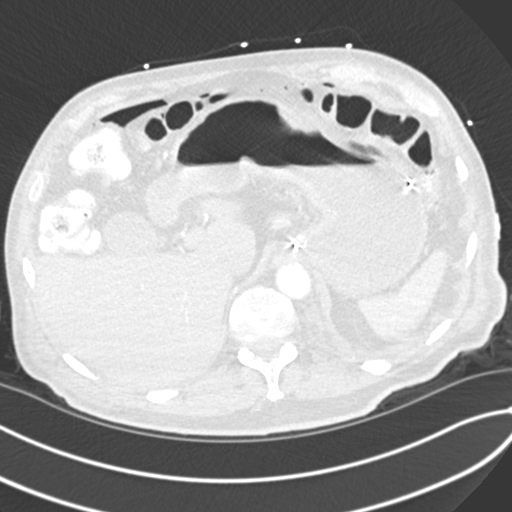
[im 111/359  lung]
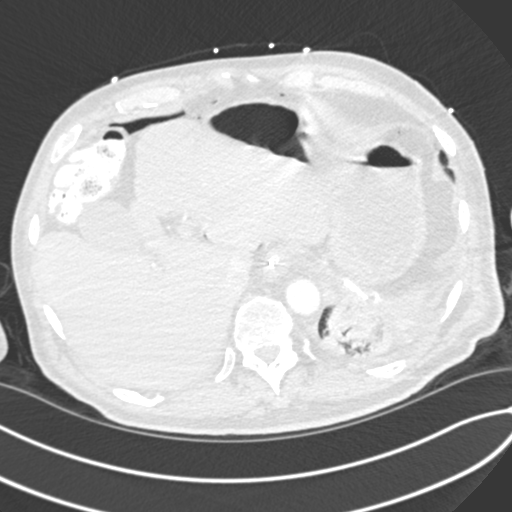
[im 166/359  lung]
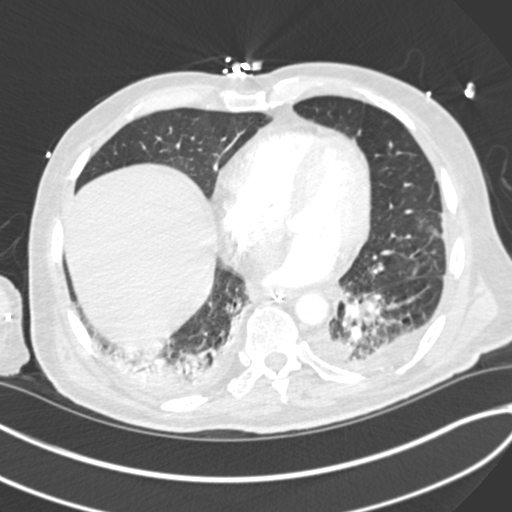
[im 193/359  lung]
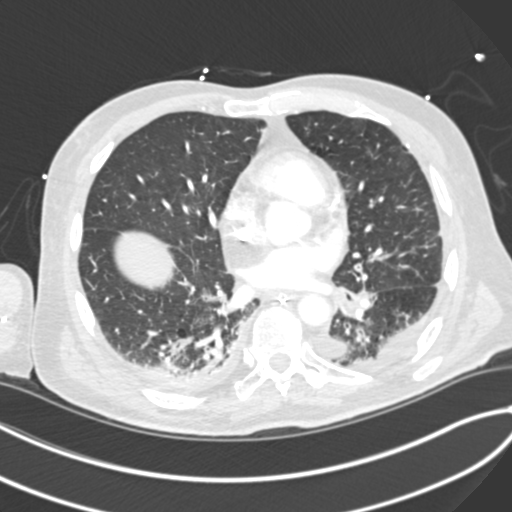
[im 248/359  lung]
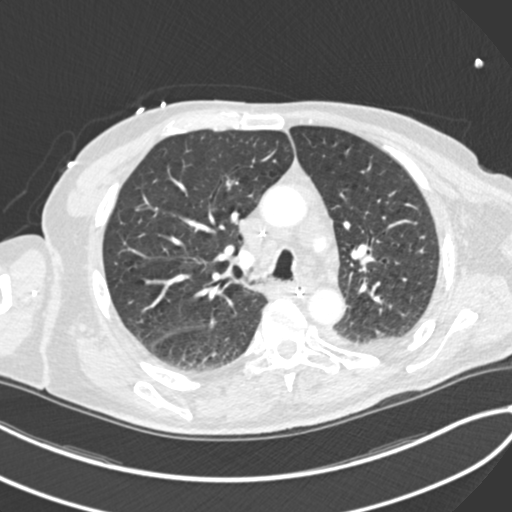
[im 276/359  lung]
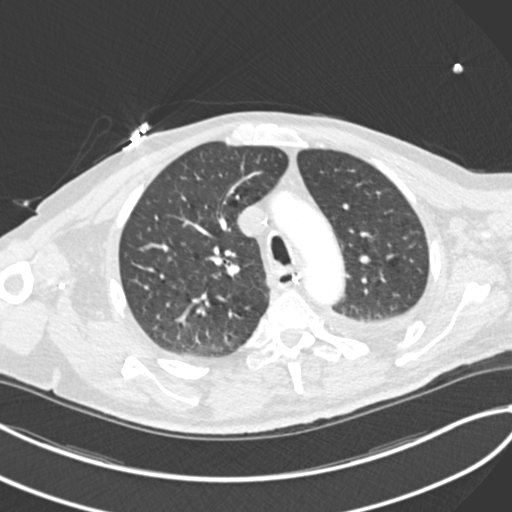
[im 331/359  lung]
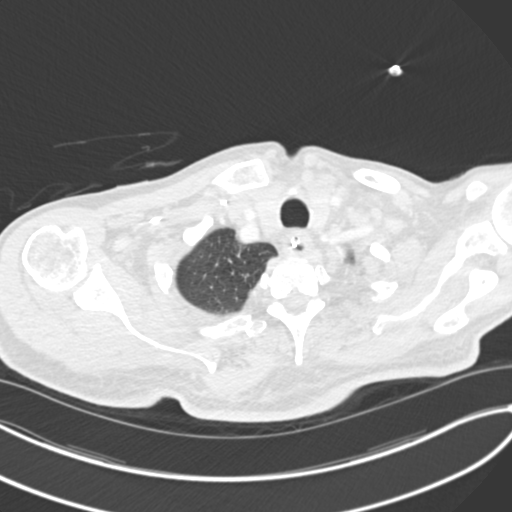

[Series 7: coronal mpr · coronal · 0.78mm/px · 1 of 133 slices shown]
[im 67/133  soft-tissue]
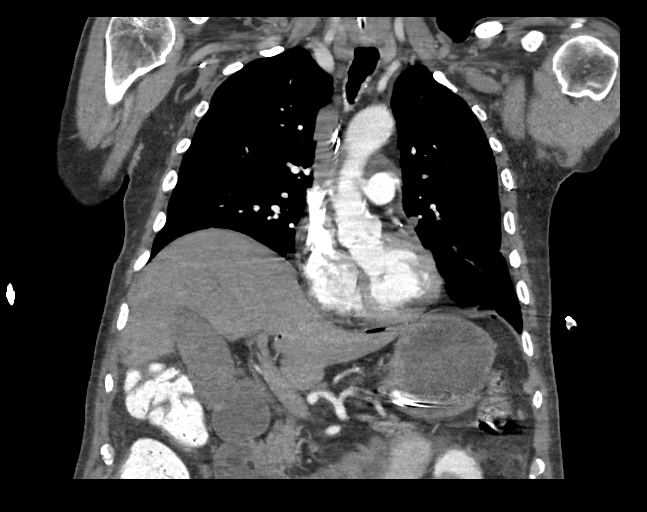

[Series 11: lung · axial · 0.73mm/px · z∈[-116,-56]mm · 2 of 150 slices shown]
[im 30/150  soft-tissue]
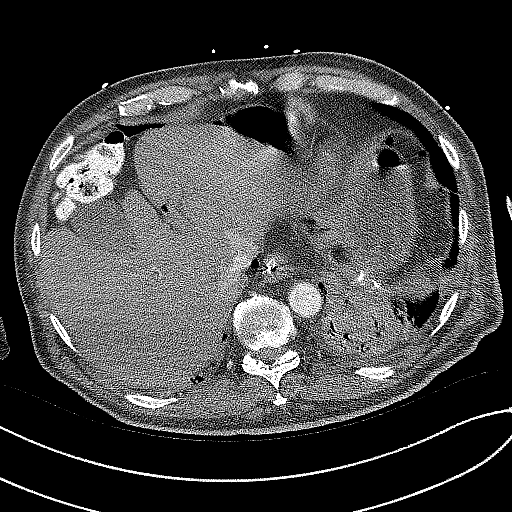
[im 60/150  soft-tissue]
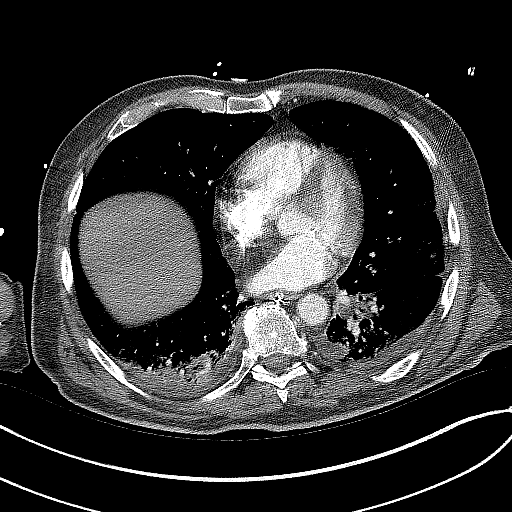

[Series 12: axial st · axial · 0.74mm/px · z∈[-373,-198]mm · 2 of 106 slices shown (2 of 2)]
[im 36/106  lung]
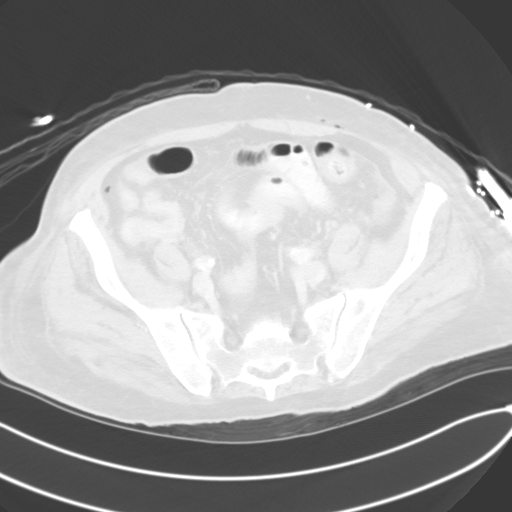
[im 71/106  soft-tissue]
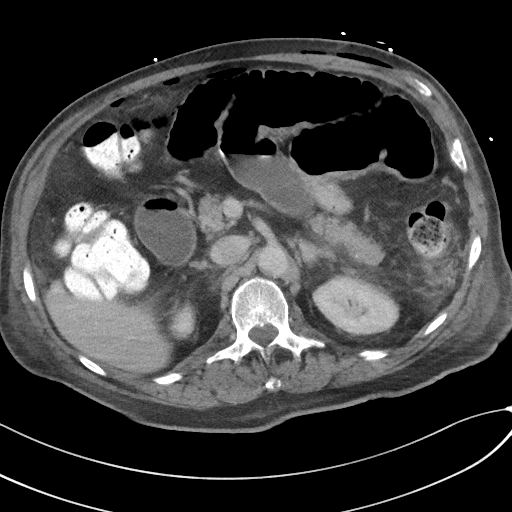

[15 of 46 positions shown; findings below may reference images not displayed]

FINDINGS: CTA CHEST FINDINGS

Cardiovascular: There are no filling defects within the pulmonary
arteries to suggest pulmonary embolus. Breathing motion artifact
limits detailed assessment. Aortic atherosclerosis without
dissection. No aortic aneurysm. Right upper extremity central line
tip in the lower SVC. The heart is normal in size. There are
coronary artery calcifications. No pericardial effusion.

Mediastinum/Nodes: Small mediastinal and bilateral hilar lymph
nodes, typically reactive. No visualized thyroid nodule. Enteric
tube in place with slight esophageal distension. No esophageal wall
thickening.

Lungs/Pleura: Mild emphysema. There are small bilateral pleural
effusions. Associated compressive atelectasis. Presumed chronic
round atelectasis at the left lung base with adjacent pleural
calcifications. There is mild bronchial thickening in the segmental
lower lobes. The additional areas of ground-glass opacity on prior
abdominal CT have improved in the interim. Debris within the
trachea. Subsegmental bronchial occlusion in the left lower lobe. No
septal thickening or pulmonary edema.

Musculoskeletal: There are no acute or suspicious osseous
abnormalities.

Review of the MIP images confirms the above findings.

CT ABDOMEN and PELVIS FINDINGS

Hepatobiliary: Focal fatty infiltration adjacent to the falciform
ligament. No other focal hepatic abnormality. Resolved
pericholecystic fluid from prior.

Pancreas: No ductal dilatation or inflammation.

Spleen: Normal in size without focal abnormality. No evidence of
splenic injury. No perisplenic fluid.

Adrenals/Urinary Tract: Normal right adrenal gland. Stable left
adrenal thickening. No hydronephrosis or perinephric edema.
Homogeneous renal enhancement with symmetric excretion on delayed
phase imaging. Urinary bladder is decompressed by Foley catheter.

Stomach/Bowel: Enteric tube tip in the stomach. The stomach is
distended with intraluminal fluid. Duodenum a slightly dilated and
fluid-filled. Severe dilated with some intraluminal fluid. No
obstruction, with enteric contrast reaching the colon. There is a
general transition from dilated to nondilated small bowel without
transition point no small bowel pneumatosis. Some of the small bowel
loops in the right lower quadrant are slightly edematous, however no
evidence of pneumatosis. Enteric contrast reaches the colon. There
is a moderate volume of colonic stool. No wall thickening of the
right colon. There is a 10 cm length wall thickening of the proximal
descending colon, coronal reformat series 14, image 52. Colonic
diverticulosis without focal diverticulitis.

Vascular/Lymphatic: Aortic and branch atherosclerosis. No portal
venous or mesenteric gas. No bulky adenopathy.

Reproductive: Prominent prostate gland with coarse calcification
again seen.

Other: Scattered foci of pneumoperitoneum, not unexpected post
recent abdominal surgery. Mild generalized fat stranding with small
amount of non organized free fluid in the pericolic gutters and
tracking into the pelvis. No visualized abscess. Air in the
subcutaneous tissues of the anterior abdominal wall from recent
surgery. No subcutaneous collection. Soft tissue thickening standing
into the right inguinal canal with persistent but improved non
organized fluid, likely sequela of recent herniorrhaphy.

Musculoskeletal: Chronic L5 intra-articular pars defects with grade
1 anterolisthesis of L5 on S1. Stable degenerative change in the
spine from recent prior.

Review of the MIP images confirms the above findings.
IMPRESSION: 1. No pulmonary embolus.
2. Small bilateral pleural effusions with compressive atelectasis.
The additional areas of ground-glass opacity in the lung bases on
prior abdominal CT have improved in the interim.
3. Debris within the trachea with mild bronchial thickening.
Subsegmental bronchial occlusion in the left lower lobe. This may be
due to mucous plugging/retained secretions or aspiration.
4. No evidence of bowel injury post recent lysis of adhesions. Some
proximal bowel dilatation is likely related to a degree of
postoperative ileus. Enteric contrast reaches the colon. Free air is
expected 2 days post intra-abdominal surgery.
5. Segment of wall thickening involving the descending colon which
is nonspecific.
6. Colonic diverticulosis without diverticulitis.
7. Sequela of recent right inguinal

Aortic Atherosclerosis (3ONX7-JG2.2) and Emphysema (3ONX7-KW1.J).

These results were discussed by telephone at the time of
interpretation on 02/03/2020 at [DATE] to provider KWEK SALUDARES.

## 2021-12-06 IMAGING — DX DG ABD PORTABLE 1V
1 series · 2 of 2 positions shown · non-contrast
Comparison: 02/03/2020 and 01/31/2020

CLINICAL DATA: Ileus

EXAM:
PORTABLE ABDOMEN - 1 VIEW

[Series 1: abdomen kub · 0.14mm/px · 2 of 2 slices shown]
[im 1/2]
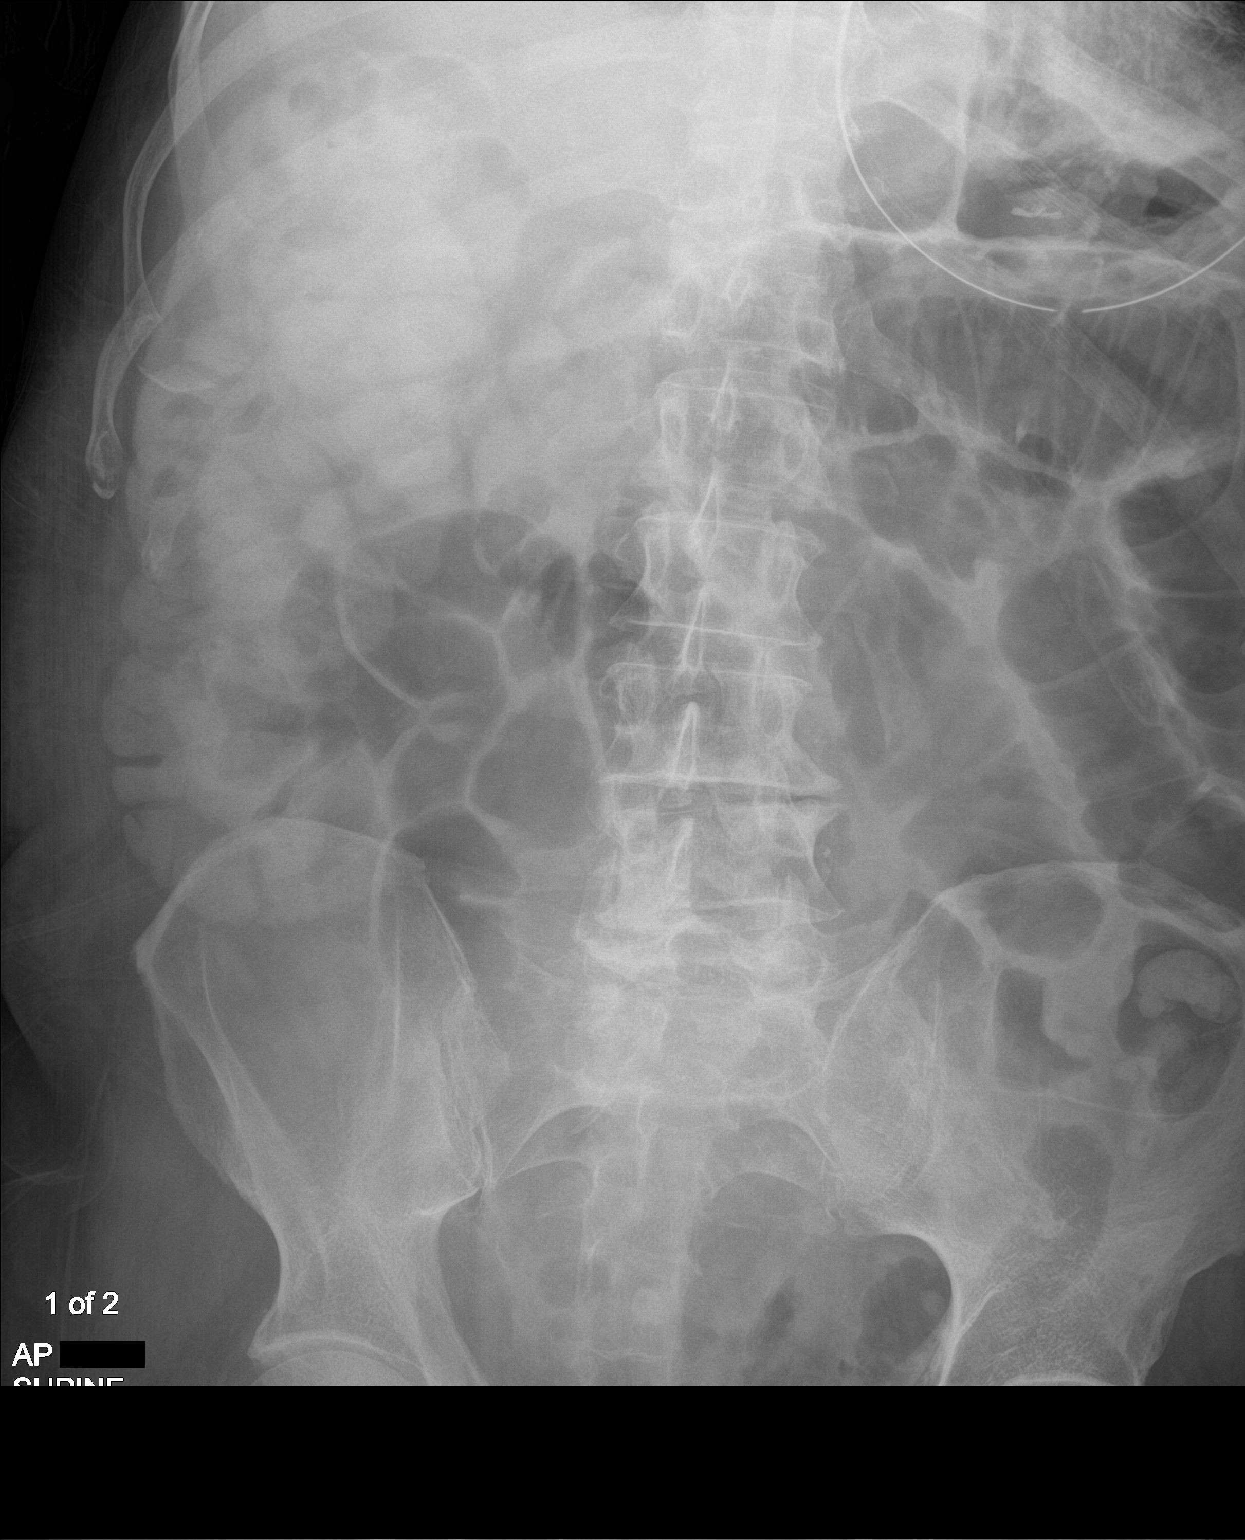
[im 2/2]
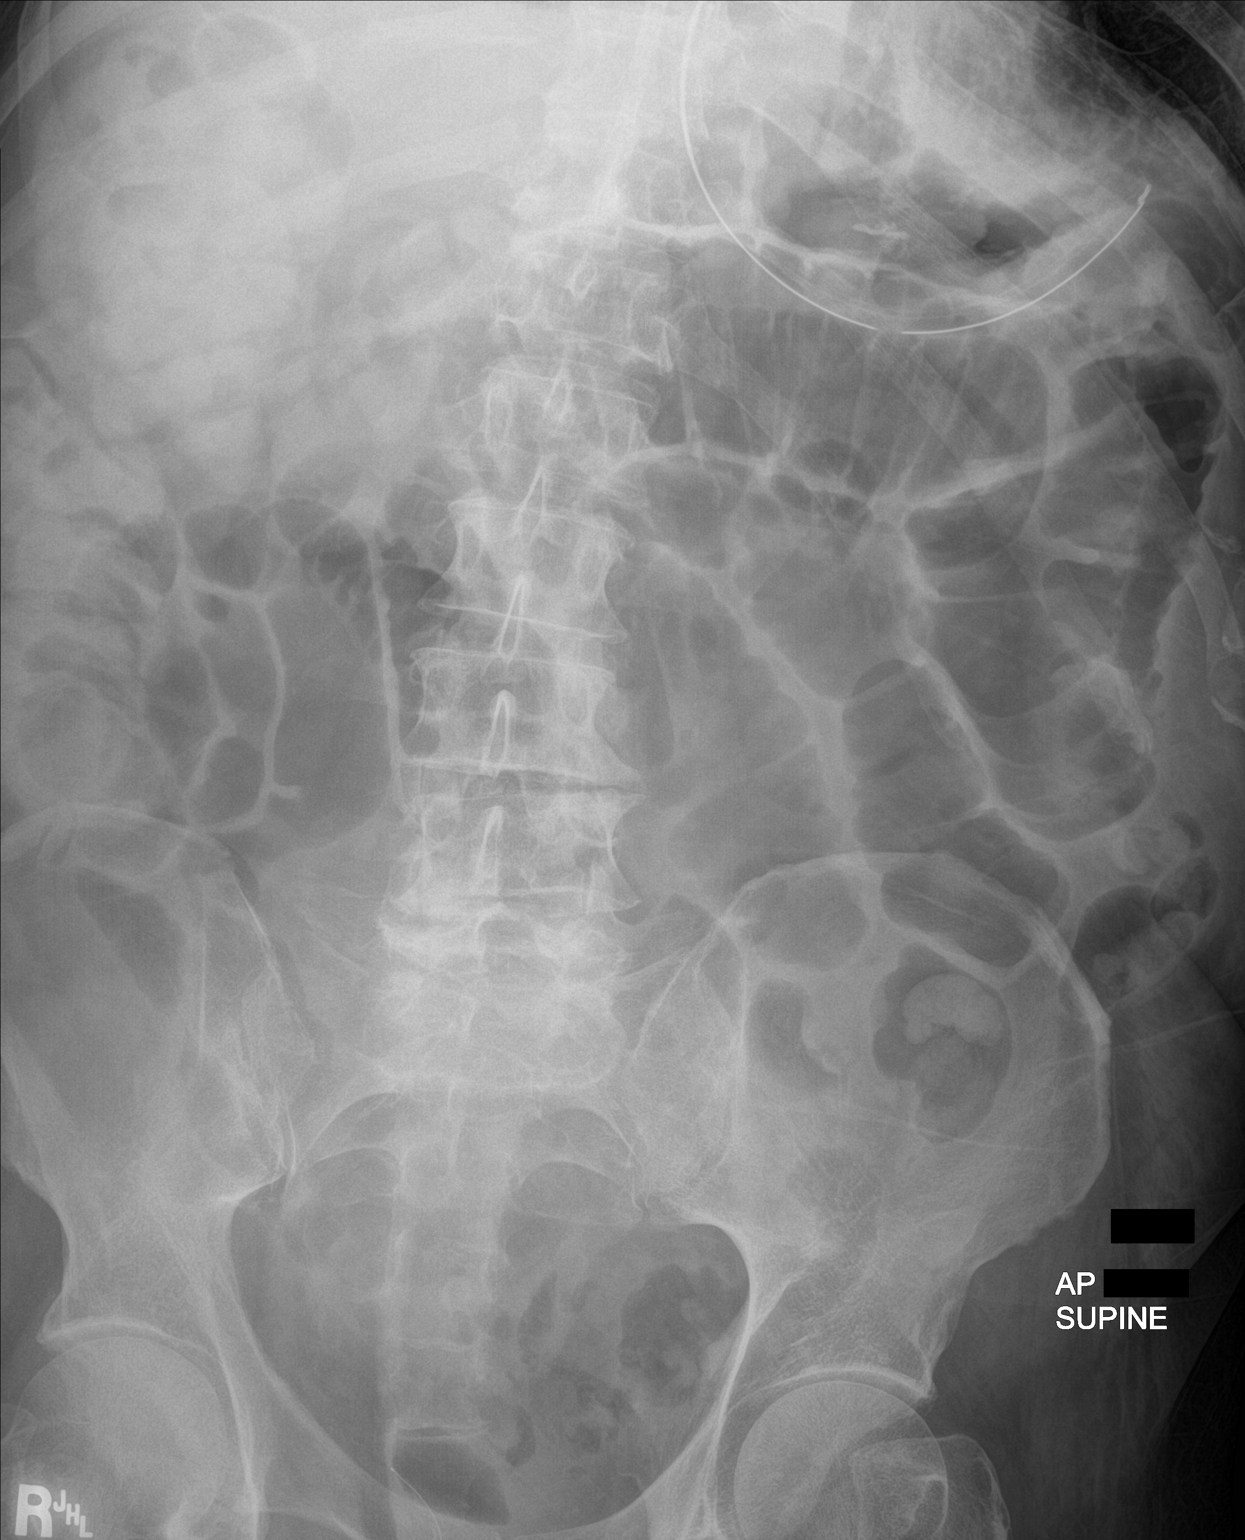

[2 of 2 positions shown; findings below may reference images not displayed]

FINDINGS: In is a gastric tube is present in the stomach. There is some dilute
contrast medium in the right colon.

Loops of dilated small bowel are present in the upper abdomen,
measuring up to 4.7 cm in diameter. These measured up to 5.3 cm on
01/31/2020 and on 02/03/2020.

There is formed stool in the distal colon. Mild lower lumbar
spondylosis and degenerative disc disease.
IMPRESSION: 1. Mildly dilated small bowel, potentially from ileus or
obstruction. Small bowel loops measure up to 4.7 cm in diameter, and
previously measured up to 5.3 cm in diameter.
2. There is some dilute contrast medium in the right colon.

## 2023-04-20 DIAGNOSIS — E119 Type 2 diabetes mellitus without complications: Secondary | ICD-10-CM

## 2023-06-22 DIAGNOSIS — H25012 Cortical age-related cataract, left eye: Secondary | ICD-10-CM | POA: Diagnosis present

## 2023-06-22 DIAGNOSIS — H25011 Cortical age-related cataract, right eye: Secondary | ICD-10-CM | POA: Diagnosis present

## 2023-08-19 ENCOUNTER — Emergency Department (HOSPITAL_BASED_OUTPATIENT_CLINIC_OR_DEPARTMENT_OTHER)

## 2023-08-19 ENCOUNTER — Encounter (HOSPITAL_BASED_OUTPATIENT_CLINIC_OR_DEPARTMENT_OTHER): Payer: Self-pay

## 2023-08-19 ENCOUNTER — Other Ambulatory Visit: Payer: Self-pay

## 2023-08-19 ENCOUNTER — Inpatient Hospital Stay (HOSPITAL_BASED_OUTPATIENT_CLINIC_OR_DEPARTMENT_OTHER)
Admission: EM | Admit: 2023-08-19 | Discharge: 2023-08-23 | DRG: 388 | Disposition: A | Discharge status: Home / Self Care | Attending: Internal Medicine | Admitting: Internal Medicine

## 2023-08-19 DIAGNOSIS — Z96652 Presence of left artificial knee joint: Secondary | ICD-10-CM | POA: Diagnosis present

## 2023-08-19 DIAGNOSIS — H25011 Cortical age-related cataract, right eye: Secondary | ICD-10-CM | POA: Diagnosis present

## 2023-08-19 DIAGNOSIS — F513 Sleepwalking [somnambulism]: Secondary | ICD-10-CM | POA: Diagnosis present

## 2023-08-19 DIAGNOSIS — I10 Essential (primary) hypertension: Secondary | ICD-10-CM | POA: Diagnosis present

## 2023-08-19 DIAGNOSIS — J9600 Acute respiratory failure, unspecified whether with hypoxia or hypercapnia: Secondary | ICD-10-CM | POA: Diagnosis present

## 2023-08-19 DIAGNOSIS — J432 Centrilobular emphysema: Secondary | ICD-10-CM | POA: Diagnosis present

## 2023-08-19 DIAGNOSIS — K219 Gastro-esophageal reflux disease without esophagitis: Secondary | ICD-10-CM | POA: Diagnosis present

## 2023-08-19 DIAGNOSIS — K56609 Unspecified intestinal obstruction, unspecified as to partial versus complete obstruction: Principal | ICD-10-CM | POA: Diagnosis present

## 2023-08-19 DIAGNOSIS — E782 Mixed hyperlipidemia: Secondary | ICD-10-CM | POA: Diagnosis present

## 2023-08-19 DIAGNOSIS — J9601 Acute respiratory failure with hypoxia: Secondary | ICD-10-CM | POA: Diagnosis present

## 2023-08-19 DIAGNOSIS — J189 Pneumonia, unspecified organism: Secondary | ICD-10-CM | POA: Insufficient documentation

## 2023-08-19 DIAGNOSIS — E876 Hypokalemia: Secondary | ICD-10-CM | POA: Diagnosis present

## 2023-08-19 DIAGNOSIS — K5651 Intestinal adhesions [bands], with partial obstruction: Principal | ICD-10-CM | POA: Diagnosis present

## 2023-08-19 DIAGNOSIS — G47411 Narcolepsy with cataplexy: Secondary | ICD-10-CM | POA: Diagnosis present

## 2023-08-19 DIAGNOSIS — H25013 Cortical age-related cataract, bilateral: Secondary | ICD-10-CM | POA: Diagnosis present

## 2023-08-19 DIAGNOSIS — E119 Type 2 diabetes mellitus without complications: Secondary | ICD-10-CM

## 2023-08-19 DIAGNOSIS — Z7984 Long term (current) use of oral hypoglycemic drugs: Secondary | ICD-10-CM

## 2023-08-19 DIAGNOSIS — J690 Pneumonitis due to inhalation of food and vomit: Secondary | ICD-10-CM | POA: Diagnosis present

## 2023-08-19 DIAGNOSIS — R0902 Hypoxemia: Secondary | ICD-10-CM

## 2023-08-19 DIAGNOSIS — H25012 Cortical age-related cataract, left eye: Secondary | ICD-10-CM | POA: Diagnosis present

## 2023-08-19 DIAGNOSIS — Z79899 Other long term (current) drug therapy: Secondary | ICD-10-CM

## 2023-08-19 DIAGNOSIS — F17290 Nicotine dependence, other tobacco product, uncomplicated: Secondary | ICD-10-CM | POA: Diagnosis present

## 2023-08-19 DIAGNOSIS — Z1152 Encounter for screening for COVID-19: Secondary | ICD-10-CM

## 2023-08-19 HISTORY — DX: Narcolepsy without cataplexy: G47.419

## 2023-08-19 LAB — CBC
HCT: 52.4 % — ABNORMAL HIGH (ref 39.0–52.0)
Hemoglobin: 17 g/dL (ref 13.0–17.0)
MCH: 23.4 pg — ABNORMAL LOW (ref 26.0–34.0)
MCHC: 32.4 g/dL (ref 30.0–36.0)
MCV: 72 fL — ABNORMAL LOW (ref 80.0–100.0)
Platelets: 408 10*3/uL — ABNORMAL HIGH (ref 150–400)
RBC: 7.28 MIL/uL — ABNORMAL HIGH (ref 4.22–5.81)
RDW: 18.4 % — ABNORMAL HIGH (ref 11.5–15.5)
WBC: 11.1 10*3/uL — ABNORMAL HIGH (ref 4.0–10.5)
nRBC: 0 % (ref 0.0–0.2)

## 2023-08-19 LAB — COMPREHENSIVE METABOLIC PANEL WITH GFR
ALT: 15 U/L (ref 0–44)
AST: 31 U/L (ref 15–41)
Albumin: 4.1 g/dL (ref 3.5–5.0)
Alkaline Phosphatase: 65 U/L (ref 38–126)
Anion gap: 21 — ABNORMAL HIGH (ref 5–15)
BUN: 18 mg/dL (ref 8–23)
CO2: 24 mmol/L (ref 22–32)
Calcium: 11.3 mg/dL — ABNORMAL HIGH (ref 8.9–10.3)
Chloride: 93 mmol/L — ABNORMAL LOW (ref 98–111)
Creatinine, Ser: 1.19 mg/dL (ref 0.61–1.24)
GFR, Estimated: >60 mL/min (ref >60)
Glucose, Bld: 192 mg/dL — ABNORMAL HIGH (ref 70–99)
Potassium: 4.2 mmol/L (ref 3.5–5.1)
Sodium: 138 mmol/L (ref 135–145)
Total Bilirubin: 0.6 mg/dL (ref 0.0–1.2)
Total Protein: 8.6 g/dL — ABNORMAL HIGH (ref 6.5–8.1)

## 2023-08-19 LAB — LIPASE, BLOOD: Lipase: 13 U/L (ref 11–51)

## 2023-08-19 LAB — TROPONIN T, HIGH SENSITIVITY
Troponin T High Sensitivity: 18 ng/L (ref <19)
Troponin T High Sensitivity: 16 ng/L (ref <19)

## 2023-08-19 LAB — PRO BRAIN NATRIURETIC PEPTIDE: Pro Brain Natriuretic Peptide: 356 pg/mL — ABNORMAL HIGH (ref <300.0)

## 2023-08-19 LAB — CK: Total CK: 38 U/L — ABNORMAL LOW (ref 49–397)

## 2023-08-19 MED ORDER — IOHEXOL 350 MG/ML SOLN
100.0000 mL | Freq: Once | INTRAVENOUS | Status: AC | PRN
  Administered 2023-08-19: 100 mL via INTRAVENOUS

## 2023-08-19 MED ORDER — LACTATED RINGERS IV BOLUS
1000.0000 mL | Freq: Once | INTRAVENOUS | Status: AC
  Administered 2023-08-20: 1000 mL via INTRAVENOUS

## 2023-08-19 MED ORDER — ONDANSETRON HCL 4 MG/2ML IJ SOLN
4.0000 mg | Freq: Once | INTRAMUSCULAR | Status: AC
  Administered 2023-08-19: 4 mg via INTRAVENOUS
  Filled 2023-08-19: qty 2

## 2023-08-19 MED ORDER — LACTATED RINGERS IV BOLUS
1000.0000 mL | Freq: Once | INTRAVENOUS | Status: AC
  Administered 2023-08-19: 1000 mL via INTRAVENOUS

## 2023-08-19 NOTE — ED Notes (Signed)
 Patient transported to CT

## 2023-08-19 NOTE — ED Triage Notes (Signed)
 Pt reports three days of epigastric abdominal pain and shortness of breath over the last three days. Pt also reports SHOB and increased weakness. Pt tachypenic in triage. Family states pt is barely able to stand due to weakness and has had poor PO intake. Pt alert and oriented x4.

## 2023-08-19 NOTE — ED Notes (Signed)
 Pt to rm 06 via wc. A?Ox4, RR equal unlabored. Placed on monitor, in gown. Wife at bedside. Awaiting lab results.

## 2023-08-19 NOTE — ED Notes (Signed)
One set of [facility] sent to lab ?

## 2023-08-19 NOTE — ED Notes (Signed)
 XR at bedside

## 2023-08-19 NOTE — ED Provider Notes (Signed)
 [place] EMERGENCY DEPARTMENT AT [facility] Provider Note   CSN: [id number] Arrival date & time: 08/19/23  2034     Patient presents with: Abdominal Pain and Emesis   Patrick Lam is a 78 y.o. male.  {Add pertinent medical, surgical, social history, OB history to HPI:7128} 78 year old male history of hypertension, hyperlipidemia, diabetes, and prior tobacco use who presents emergency department with abdominal pain, shortness of breath, nausea and vomiting.  Patient reports that the past 2 days the air conditioning went out at his house.  He tried to stay at home and then started feeling sick.  Went over to his daughter's house and started having epigastric abdominal pain and nausea and vomiting.  Also started having some shortness of breath today.  Last bowel movement was Monday.  Unsure if he is passing gas.  Abdomen does feel bloated.  Only abdominal surgery is a hernia repair.       Prior to Admission medications   Medication Sig Start Date End Date Taking? Authorizing Provider  acetaminophen  (TYLENOL ) 500 MG tablet Take 1,000 mg by mouth every 6 (six) hours as needed for mild pain, moderate pain, fever or headache.    [provider]  clomiPRAMINE  (ANAFRANIL ) 25 MG capsule Take 25 mg by mouth 3 (three) times daily.     [provider]  diltiazem  (DILACOR XR ) 240 MG 24 hr capsule Take 240 mg by mouth daily.    [provider]  feeding supplement (ENSURE ENLIVE / ENSURE PLUS) LIQD Take 237 mLs by mouth 3 (three) times daily with meals. 02/15/20   Vicci Burnard SAUNDERS, PA-C  GARLIC OIL PO Take 1 tablet by mouth daily.    [provider]  hydrochlorothiazide  (HYDRODIURIL ) 25 MG tablet Take 25 mg by mouth daily.    [provider]  losartan  (COZAAR ) 25 MG tablet Take 25 mg by mouth daily.    [provider]  meloxicam (MOBIC) 7.5 MG tablet Take 7.5 mg by mouth daily as needed for pain.    [provider]  metFORMIN   (GLUCOPHAGE ) 500 MG tablet Take 500 mg by mouth daily with breakfast.    [provider]  methocarbamol  (ROBAXIN ) 500 MG tablet Take 1 tablet (500 mg total) by mouth every 8 (eight) hours as needed for muscle spasms. 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    [provider]  Multiple Vitamin (MULTIVITAMIN WITH MINERALS) TABS tablet Take 1 tablet by mouth daily.    [provider]  omeprazole  (PRILOSEC) 20 MG capsule Take 20 mg by mouth daily.    [provider]  rosuvastatin  (CRESTOR ) 10 MG tablet Take 10 mg by mouth daily.    [provider]  Sodium Oxybate  (XYREM ) 500 MG/ML SOLN Take 4.5 g by mouth 2 (two) times daily. During the night    [provider]  vitamin B-12 (CYANOCOBALAMIN) 500 MCG tablet Take 500 mcg by mouth daily.    [provider]    Allergies: Patient has no known allergies.    Review of Systems  Updated Vital Signs BP (!) 163/77 (BP Location: Left Arm)   Pulse (!) 133   Temp 97.6 F (36.4 C)   Resp (!) 28   Ht 6' 1 (1.854 m)   Wt 92.1 kg   SpO2 91%   BMI 26.78 kg/m   Physical Exam Vitals and nursing note reviewed.  Constitutional:  General: He is not in acute distress.    Appearance: He is well-developed.  HENT:     Head: Normocephalic and atraumatic.     Right Ear: External ear normal.     Left Ear: External ear normal.     Nose: Nose normal.  Eyes:     Extraocular Movements: Extraocular movements intact.     Conjunctiva/sclera: Conjunctivae normal.     Pupils: Pupils are equal, round, and reactive to light.  Cardiovascular:     Rate and Rhythm: Regular rhythm. Tachycardia present.     Heart sounds: Normal heart sounds.  Pulmonary:     Effort: Pulmonary effort is normal. No respiratory distress.     Breath sounds: Rhonchi (Bibasilar) present.  Abdominal:     General: There is no distension.      Palpations: Abdomen is soft. There is no mass.     Tenderness: There is abdominal tenderness (RLQ). There is no guarding.  Musculoskeletal:     Cervical back: Normal range of motion and neck supple.     Right lower leg: No edema.     Left lower leg: No edema.  Skin:    General: Skin is warm and dry.  Neurological:     Mental Status: He is alert. Mental status is at baseline.  Psychiatric:        Mood and Affect: Mood normal.        Behavior: Behavior normal.     (all labs ordered are listed, but only abnormal results are displayed) Labs Reviewed  CBC - Abnormal; Notable for the following components:      Result Value   WBC 11.1 (*)    RBC 7.28 (*)    HCT 52.4 (*)    MCV 72.0 (*)    MCH 23.4 (*)    RDW 18.4 (*)    Platelets 408 (*)    All other components within normal limits  LIPASE, BLOOD  COMPREHENSIVE METABOLIC PANEL WITH GFR  URINALYSIS, ROUTINE W REFLEX MICROSCOPIC  TROPONIN T, HIGH SENSITIVITY    EKG: None  Radiology: No results found.  {Document cardiac monitor, telemetry assessment procedure when appropriate:32947} Procedures   Medications Ordered in the ED - No data to display    {Click here for ABCD2, HEART and other calculators REFRESH Note before signing:1}                              Medical Decision Making Amount and/or Complexity of Data Reviewed Labs: ordered. Radiology: ordered.  Risk Prescription drug management.   ***  {Document critical care time when appropriate  Document review of labs and clinical decision tools ie CHADS2VASC2, etc  Document your independent review of radiology images and any outside records  Document your discussion with family members, caretakers and with consultants  Document social determinants of health affecting pt's care  Document your decision making why or why not admission, treatments were needed:32947:::1}   Final diagnoses:  None    ED Discharge Orders     None

## 2023-08-19 NOTE — ED Notes (Signed)
 Pt back from CT. Tolerated well. Placed back on monitor. Awaiting rpt trop and CT results. Family at bedside.

## 2023-08-20 ENCOUNTER — Emergency Department (HOSPITAL_BASED_OUTPATIENT_CLINIC_OR_DEPARTMENT_OTHER): Admitting: Radiology

## 2023-08-20 ENCOUNTER — Inpatient Hospital Stay (HOSPITAL_COMMUNITY)

## 2023-08-20 ENCOUNTER — Encounter (HOSPITAL_COMMUNITY): Payer: Self-pay | Admitting: Internal Medicine

## 2023-08-20 DIAGNOSIS — J9601 Acute respiratory failure with hypoxia: Secondary | ICD-10-CM

## 2023-08-20 DIAGNOSIS — G47411 Narcolepsy with cataplexy: Secondary | ICD-10-CM | POA: Diagnosis present

## 2023-08-20 DIAGNOSIS — Z79899 Other long term (current) drug therapy: Secondary | ICD-10-CM | POA: Diagnosis not present

## 2023-08-20 DIAGNOSIS — H25013 Cortical age-related cataract, bilateral: Secondary | ICD-10-CM | POA: Diagnosis present

## 2023-08-20 DIAGNOSIS — K56609 Unspecified intestinal obstruction, unspecified as to partial versus complete obstruction: Principal | ICD-10-CM

## 2023-08-20 DIAGNOSIS — K219 Gastro-esophageal reflux disease without esophagitis: Secondary | ICD-10-CM

## 2023-08-20 DIAGNOSIS — E876 Hypokalemia: Secondary | ICD-10-CM | POA: Diagnosis present

## 2023-08-20 DIAGNOSIS — J189 Pneumonia, unspecified organism: Secondary | ICD-10-CM | POA: Diagnosis not present

## 2023-08-20 DIAGNOSIS — Z96652 Presence of left artificial knee joint: Secondary | ICD-10-CM | POA: Diagnosis present

## 2023-08-20 DIAGNOSIS — J690 Pneumonitis due to inhalation of food and vomit: Secondary | ICD-10-CM | POA: Diagnosis present

## 2023-08-20 DIAGNOSIS — F513 Sleepwalking [somnambulism]: Secondary | ICD-10-CM | POA: Diagnosis present

## 2023-08-20 DIAGNOSIS — F17290 Nicotine dependence, other tobacco product, uncomplicated: Secondary | ICD-10-CM | POA: Diagnosis present

## 2023-08-20 DIAGNOSIS — K5651 Intestinal adhesions [bands], with partial obstruction: Secondary | ICD-10-CM | POA: Diagnosis present

## 2023-08-20 DIAGNOSIS — E782 Mixed hyperlipidemia: Secondary | ICD-10-CM | POA: Diagnosis present

## 2023-08-20 DIAGNOSIS — I10 Essential (primary) hypertension: Secondary | ICD-10-CM

## 2023-08-20 DIAGNOSIS — Z1152 Encounter for screening for COVID-19: Secondary | ICD-10-CM | POA: Diagnosis not present

## 2023-08-20 DIAGNOSIS — E119 Type 2 diabetes mellitus without complications: Secondary | ICD-10-CM

## 2023-08-20 DIAGNOSIS — J432 Centrilobular emphysema: Secondary | ICD-10-CM

## 2023-08-20 DIAGNOSIS — Z7984 Long term (current) use of oral hypoglycemic drugs: Secondary | ICD-10-CM | POA: Diagnosis not present

## 2023-08-20 LAB — RESP PANEL BY RT-PCR (RSV, FLU A&B, COVID)  RVPGX2
Influenza A by PCR: NEGATIVE
Influenza B by PCR: NEGATIVE
Resp Syncytial Virus by PCR: NEGATIVE
SARS Coronavirus 2 by RT PCR: NEGATIVE

## 2023-08-20 LAB — URINALYSIS, ROUTINE W REFLEX MICROSCOPIC
Bilirubin Urine: NEGATIVE
Glucose, UA: NEGATIVE mg/dL
Hgb urine dipstick: NEGATIVE
Ketones, ur: NEGATIVE mg/dL
Leukocytes,Ua: NEGATIVE
Nitrite: NEGATIVE
Protein, ur: NEGATIVE mg/dL
Specific Gravity, Urine: 1.046 — ABNORMAL HIGH (ref 1.005–1.030)
pH: 6.5 (ref 5.0–8.0)

## 2023-08-20 LAB — CBC
HCT: 44.6 % (ref 39.0–52.0)
Hemoglobin: 14.1 g/dL (ref 13.0–17.0)
MCH: 23 pg — ABNORMAL LOW (ref 26.0–34.0)
MCHC: 31.6 g/dL (ref 30.0–36.0)
MCV: 72.6 fL — ABNORMAL LOW (ref 80.0–100.0)
Platelets: 327 10*3/uL (ref 150–400)
RBC: 6.14 MIL/uL — ABNORMAL HIGH (ref 4.22–5.81)
RDW: 17 % — ABNORMAL HIGH (ref 11.5–15.5)
WBC: 6.3 10*3/uL (ref 4.0–10.5)
nRBC: 0 % (ref 0.0–0.2)

## 2023-08-20 LAB — COMPREHENSIVE METABOLIC PANEL WITH GFR
ALT: 15 U/L (ref 0–44)
AST: 22 U/L (ref 15–41)
Albumin: 2.6 g/dL — ABNORMAL LOW (ref 3.5–5.0)
Alkaline Phosphatase: 37 U/L — ABNORMAL LOW (ref 38–126)
Anion gap: 12 (ref 5–15)
BUN: 15 mg/dL (ref 8–23)
CO2: 30 mmol/L (ref 22–32)
Calcium: 9.2 mg/dL (ref 8.9–10.3)
Chloride: 98 mmol/L (ref 98–111)
Creatinine, Ser: 1 mg/dL (ref 0.61–1.24)
GFR, Estimated: >60 mL/min (ref >60)
Glucose, Bld: 146 mg/dL — ABNORMAL HIGH (ref 70–99)
Potassium: 4.2 mmol/L (ref 3.5–5.1)
Sodium: 140 mmol/L (ref 135–145)
Total Bilirubin: 0.4 mg/dL (ref 0.0–1.2)
Total Protein: 6.4 g/dL — ABNORMAL LOW (ref 6.5–8.1)

## 2023-08-20 LAB — GLUCOSE, CAPILLARY
Glucose-Capillary: 140 mg/dL — ABNORMAL HIGH (ref 70–99)
Glucose-Capillary: 110 mg/dL — ABNORMAL HIGH (ref 70–99)
Glucose-Capillary: 121 mg/dL — ABNORMAL HIGH (ref 70–99)

## 2023-08-20 LAB — LACTIC ACID, PLASMA: Lactic Acid, Venous: 1.3 mmol/L (ref 0.5–1.9)

## 2023-08-20 MED ORDER — SODIUM CHLORIDE 0.9 % IV SOLN
2.0000 g | Freq: Once | INTRAVENOUS | Status: AC
  Administered 2023-08-20: 2 g via INTRAVENOUS
  Filled 2023-08-20: qty 20

## 2023-08-20 MED ORDER — INSULIN ASPART 100 UNIT/ML IJ SOLN
0.0000 [IU] | INTRAMUSCULAR | Status: DC
  Administered 2023-08-22: 1 [IU] via SUBCUTANEOUS

## 2023-08-20 MED ORDER — SODIUM CHLORIDE 0.9 % IV SOLN
2.0000 g | INTRAVENOUS | Status: DC
  Administered 2023-08-20: 2 g via INTRAVENOUS
  Filled 2023-08-20: qty 20

## 2023-08-20 MED ORDER — ONDANSETRON HCL 4 MG/2ML IJ SOLN: 4.0000 mg | Freq: Three times a day (TID) | INTRAMUSCULAR | Status: DC | PRN

## 2023-08-20 MED ORDER — SODIUM CHLORIDE 0.9% FLUSH
3.0000 mL | Freq: Two times a day (BID) | INTRAVENOUS | Status: DC
  Administered 2023-08-20 – 2023-08-22 (×6): 3 mL via INTRAVENOUS

## 2023-08-20 MED ORDER — ONDANSETRON HCL 4 MG/2ML IJ SOLN
4.0000 mg | Freq: Once | INTRAMUSCULAR | Status: AC
  Administered 2023-08-20: 4 mg via INTRAVENOUS
  Filled 2023-08-20: qty 2

## 2023-08-20 MED ORDER — HYDROMORPHONE HCL 1 MG/ML IJ SOLN: 0.5000 mg | INTRAMUSCULAR | Status: DC | PRN

## 2023-08-20 MED ORDER — ACETAMINOPHEN 650 MG RE SUPP: 650.0000 mg | Freq: Four times a day (QID) | RECTAL | Status: DC | PRN

## 2023-08-20 MED ORDER — FENTANYL CITRATE PF 50 MCG/ML IJ SOSY
25.0000 ug | PREFILLED_SYRINGE | Freq: Once | INTRAMUSCULAR | Status: AC
  Administered 2023-08-20: 25 ug via INTRAVENOUS
  Filled 2023-08-20: qty 1

## 2023-08-20 MED ORDER — DIATRIZOATE MEGLUMINE & SODIUM 66-10 % PO SOLN
90.0000 mL | Freq: Once | ORAL | Status: AC
  Administered 2023-08-20: 90 mL via NASOGASTRIC
  Filled 2023-08-20: qty 90

## 2023-08-20 MED ORDER — LACTATED RINGERS IV SOLN: INTRAVENOUS | Status: DC

## 2023-08-20 MED ORDER — FENTANYL CITRATE PF 50 MCG/ML IJ SOSY
25.0000 ug | PREFILLED_SYRINGE | INTRAMUSCULAR | Status: AC | PRN
  Administered 2023-08-20 (×3): 25 ug via INTRAVENOUS
  Filled 2023-08-20 (×3): qty 1

## 2023-08-20 MED ORDER — ACETAMINOPHEN 325 MG PO TABS
650.0000 mg | ORAL_TABLET | Freq: Four times a day (QID) | ORAL | Status: DC | PRN
Start: 2023-08-20 — End: 2023-08-23

## 2023-08-20 MED ORDER — SODIUM CHLORIDE 0.9 % IV SOLN
500.0000 mg | INTRAVENOUS | Status: DC
  Administered 2023-08-20: 500 mg via INTRAVENOUS
  Filled 2023-08-20: qty 5

## 2023-08-20 MED ORDER — SODIUM CHLORIDE 0.9 % IV SOLN
500.0000 mg | Freq: Once | INTRAVENOUS | Status: AC
  Administered 2023-08-20: 500 mg via INTRAVENOUS
  Filled 2023-08-20: qty 5

## 2023-08-20 MED ORDER — FENTANYL CITRATE PF 50 MCG/ML IJ SOSY
25.0000 ug | PREFILLED_SYRINGE | INTRAMUSCULAR | Status: AC
  Administered 2023-08-20: 25 ug via INTRAVENOUS
  Filled 2023-08-20: qty 1

## 2023-08-20 MED ORDER — ONDANSETRON HCL 4 MG PO TABS: 4.0000 mg | ORAL_TABLET | Freq: Four times a day (QID) | ORAL | Status: DC | PRN

## 2023-08-20 NOTE — H&P (Addendum)
 History and Physical   Davey Limas FMW:[id number] DOB: December 20, 1945 DOA: 08/19/2023  PCP: Alray Loader, MD   Patient coming from: Home  Chief Complaint: Abdominal pain, nausea, vomiting  HPI: Markice Torbert is a 78 y.o. male with medical history significant of hypertension, hyperlipidemia, GERD, diabetes, narcolepsy with cataplexy, cataract, emphysema presenting with abdominal pain, nausea, vomiting.  Patient reports that 2 days ago his [facility] went out at his home.  He tried to stay there for a while but then he began to feel unwell and went to his daughter's house.  While at his daughter's house he began to feel worse and experienced nausea vomiting with abdominal pain.  And then today he developed shortness of breath.  Reports his last bowel movement was several days ago.  Does have history of hernia surgery but no other surgery.  Denies fevers, chills, chest pain, shortness of breath.   ED Course: Vital signs in the ED notable for blood pressure in 130s-160s systolic, heart rate in the 100s-130s, respirate in the 20s, requiring 3 L to maintain saturations.  Labs notable for CMP with chloride 93, bicarb normal gap of 21, glucose 192, calcium  11.3, protein 8.6.  CBC with leukocytosis to 11.1, platelets 408.  Lactic acid normal, troponin T normal x 2, lipase normal.  CK normal.  Respiratory panel for flu COVID and RSV normal.  proBNP normal at 356.  Urinalysis normal.  Blood cultures pending.  Imaging workup included chest x-ray which showed chronic left lower lobe volume loss with pleural thickening and opacity.  CTA PE study showed no evidence of PE but did show tree-in-bud opacities of the right lower lobe suspicious for infectious etiology and similar stable changes of the left lower lobe.  CT of the abdomen pelvis showed evidence of SBO with transition point.  Abdominal x-ray later showed NG tube in place.  Patient received fentanyl , ceftriaxone, azithromycin, Zofran , 2 L IV fluid in the ED.   General surgery consulted and will see the patient.  Review of Systems: As per HPI otherwise all other systems reviewed and are negative.  Past Medical History:  Diagnosis Date   Arthritis    Hypertension    Narcolepsy    Narcolepsy and cataplexy    Pre-diabetes    Shock circulatory (HCC) 02/05/2020    Past Surgical History:  Procedure Laterality Date   COLON RESECTION N/A 02/01/2020   Procedure: DIAGNOSTIC  LAPAROSCOPY;  Surgeon: Aron Shoulders, MD;  Location: [facility];  Service: General;  Laterality: N/A;   INGUINAL HERNIA REPAIR Right 01/24/2020   Procedure: REPAIR INGUINAL INCARCERATED HERNIA WITH MESH;  Surgeon: Vernetta Berg, MD;  Location: [facility];  Service: General;  Laterality: Right;   LAPAROSCOPIC LYSIS OF ADHESIONS N/A 02/01/2020   Procedure: LAPAROSCOPIC LYSIS OF ADHESIONS;  Surgeon: Aron Shoulders, MD;  Location: [facility];  Service: General;  Laterality: N/A;   MOUTH SURGERY     TOTAL KNEE ARTHROPLASTY Left 02/02/2018   Procedure: TOTAL KNEE ARTHROPLASTY;  Surgeon: Ernie Cough, MD;  Location: [facility];  Service: Orthopedics;  Laterality: Left;     Social History  reports that he has been smoking cigars. He has never used smokeless tobacco. He reports that he does not drink alcohol and does not use drugs.  No Known Allergies  History reviewed. No pertinent family history.  Prior to Admission medications   Medication Sig Start Date End Date Taking? Authorizing Provider  acetaminophen  (TYLENOL ) 500 MG tablet Take 1,000 mg by mouth every 6 (six) hours  as needed for mild pain, moderate pain, fever or headache.   Yes [provider]  amoxicillin (AMOXIL) 500 MG tablet Take 2,000 mg by mouth as needed. *dental appt* 08/03/23  Yes [provider]  clomiPRAMINE  (ANAFRANIL ) 25 MG capsule Take 25 mg by mouth 3 (three) times daily.    Yes [provider]  diltiazem  (DILACOR XR ) 240 MG 24 hr capsule Take 240 mg by mouth daily.   Yes [provider]  feeding supplement (ENSURE ENLIVE / ENSURE PLUS) LIQD Take 237 mLs by mouth 3 (three) times daily with meals. 02/15/20  Yes Vicci Sor R, PA-C  GARLIC OIL PO Take 1 tablet by mouth daily.   Yes [provider]  hydrochlorothiazide  (HYDRODIURIL ) 25 MG tablet Take 25 mg by mouth daily.   Yes [provider]  losartan  (COZAAR ) 25 MG tablet Take 25 mg by mouth daily.   Yes [provider]  metFORMIN  (GLUCOPHAGE ) 500 MG tablet Take 500 mg by mouth daily with breakfast.   Yes [provider]  methylphenidate  (RITALIN ) 20 MG tablet Take 20 mg by mouth daily as needed (narcolepsy).   Yes [provider]  Multiple Vitamin (MULTIVITAMIN WITH MINERALS) TABS tablet Take 1 tablet by mouth daily.   Yes [provider]  omeprazole  (PRILOSEC) 20 MG capsule Take 20 mg by mouth daily.   Yes [provider]  PERIOGARD  0.12 % solution  04/07/20  Yes [provider]  rosuvastatin  (CRESTOR ) 10 MG tablet Take 10 mg by mouth daily.   Yes [provider]  Sodium Oxybate  (XYREM ) 500 MG/ML SOLN Take 4.5 g by mouth 2 (two) times daily. During the night: 12am and 2am   Yes [provider]  vitamin B-12 (CYANOCOBALAMIN) 500 MCG tablet Take 500 mcg by mouth daily.   Yes [provider]  methocarbamol  (ROBAXIN ) 500 MG tablet Take 1 tablet (500 mg total) by mouth every 8 (eight) hours as needed for muscle spasms. Patient not taking: Reported on 08/20/2023 02/15/20   Vicci Sor [place], [place]    Physical Exam: Vitals:   08/20/23 1130 08/20/23 1158 08/20/23 1200 08/20/23 1321  BP: (!) 148/86  (!) 149/89 (!) 154/88  Pulse: (!) 102  (!) 103 (!) 104  Resp: 16  (!) 23 (!) 21  Temp:  (!) 97.5 F (36.4 C)  98.7 F (37.1 C)  TempSrc:  Oral  Oral  SpO2: 96%  97% 94%  Weight:      Height:        Physical Exam Constitutional:      General: He is not in acute distress.    Appearance: Normal appearance.  HENT:      Head: Normocephalic and atraumatic.     Mouth/Throat:     Mouth: Mucous membranes are moist.     Pharynx: Oropharynx is clear.  Eyes:     Extraocular Movements: Extraocular movements intact.     Pupils: Pupils are equal, round, and reactive to light.  Cardiovascular:     Rate and Rhythm: Normal rate and regular rhythm.     Pulses: Normal pulses.     Heart sounds: Normal heart sounds.  Pulmonary:     Effort: Pulmonary effort is normal. No respiratory distress.     Breath sounds: Normal breath sounds.  Abdominal:     General: Bowel sounds are normal. There is no distension.     Palpations: Abdomen is soft.     Tenderness: There is no abdominal tenderness.  Musculoskeletal:  General: No swelling or deformity.  Skin:    General: Skin is warm and dry.  Neurological:     General: No focal deficit present.     Mental Status: Mental status is at baseline.    Labs on Admission: I have personally reviewed following labs and imaging studies  CBC: Recent Labs  Lab 08/19/23 2112  WBC 11.1*  HGB 17.0  HCT 52.4*  MCV 72.0*  PLT 408*    Basic Metabolic Panel: Recent Labs  Lab 08/19/23 2112  NA 138  K 4.2  CL 93*  CO2 24  GLUCOSE 192*  BUN 18  CREATININE 1.19  CALCIUM  11.3*    GFR: Estimated Creatinine Clearance: 56 mL/min (by C-G formula based on SCr of 1.19 mg/dL).  Liver Function Tests: Recent Labs  Lab 08/19/23 2112  AST 31  ALT 15  ALKPHOS 65  BILITOT 0.6  PROT 8.6*  ALBUMIN 4.1    Urine analysis:    Component Value Date/Time   COLORURINE YELLOW 08/19/2023 2053   APPEARANCEUR CLEAR 08/19/2023 2053   LABSPEC 1.046 (H) 08/19/2023 2053   PHURINE 6.5 08/19/2023 2053   GLUCOSEU NEGATIVE 08/19/2023 2053   HGBUR NEGATIVE 08/19/2023 2053   BILIRUBINUR NEGATIVE 08/19/2023 2053   KETONESUR NEGATIVE 08/19/2023 2053   PROTEINUR NEGATIVE 08/19/2023 2053   NITRITE NEGATIVE 08/19/2023 2053   LEUKOCYTESUR NEGATIVE 08/19/2023 2053    Radiological Exams on  Admission: DG Abdomen 1 View Result Date: 08/20/2023 CLINICAL DATA:  NG tube placement EXAM: ABDOMEN - 1 VIEW COMPARISON:  CT abdomen pelvis 08/19/2023 FINDINGS: Enteric tube tip and side-port in the stomach. Similar dilated loops of small bowel in the upper abdomen. IMPRESSION: Enteric tube tip and side-port in the stomach. Electronically Signed   By: Norman Gatlin M.D.   On: 08/20/2023 03:42   CT Angio Chest PE W and/or Wo Contrast Result Date: 08/19/2023 CLINICAL DATA:  Right lower quadrant abdominal pain with nausea and vomiting, epigastric pain, shortness of breath over the last 3 days and increasing weakness. Tachypnea. Poor p.o. intake. EXAM: CT ANGIOGRAPHY CHEST CT ABDOMEN AND PELVIS WITH CONTRAST TECHNIQUE: Multidetector CT imaging of the chest was performed using the standard protocol during bolus administration of intravenous contrast. Multiplanar CT image reconstructions and MIPs were obtained to evaluate the vascular anatomy. Multidetector CT imaging of the abdomen and pelvis was performed using the standard protocol during bolus administration of intravenous contrast. RADIATION DOSE REDUCTION: This exam was performed according to the departmental dose-optimization program which includes automated exposure control, adjustment of the mA and/or kV according to patient size and/or use of iterative reconstruction technique. CONTRAST:  OMNIPAQUE  IOHEXOL  350 MG/ML SOLN COMPARISON:  Same day chest radiograph; CT 02/03/2020; CT 07/20/2023 FINDINGS: CTA CHEST FINDINGS Cardiovascular: Negative for PE. No pericardial effusion. Coronary artery and aortic atherosclerotic calcification. No aortic dissection. Lipomatous hypertrophy of the intra-atrial septum. Mediastinum/Nodes: Trachea and esophagus are unremarkable. Unchanged prominent mediastinal and hilar lymph nodes measuring up to 1.1 cm in the right hilum (series 3/image 137). Lungs/Pleura: Emphysema. Similar smooth pleural thickening in the left  thorax with associated small pleural effusion and pleural calcifications. Consolidation and bronchiolectasis in the left lower lung likely due to round atelectasis and fibrosis. This has slightly increased compared to 07/20/2023 and superimposed infection is not excluded. Diffuse bronchial wall thickening. Centrilobular micro nodularity and tree-in-bud opacities in the right lower lobe. No pneumothorax. Musculoskeletal: No acute fracture. Review of the MIP images confirms the above findings. CT ABDOMEN and PELVIS FINDINGS Hepatobiliary:  No acute abnormality. Pancreas: Unremarkable. Spleen: Unremarkable. Adrenals/Urinary Tract: Normal adrenal glands. No urinary calculi or hydronephrosis. Unremarkable bladder. Stomach/Bowel: Stomach is within normal limits. Dilation of the small bowel with abrupt transition point distal to a segment of fecalized small bowel in the low central abdomen (series 6/image 60). The small bowel downstream from the transition point is decompressed. Normal caliber colon without wall thickening. Vascular/Lymphatic: Aortic atherosclerosis. No enlarged abdominal or pelvic lymph nodes. Reproductive: Unremarkable. Other: No free intraperitoneal air.  No free fluid. Musculoskeletal: No acute fracture. 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 Review of the MIP images confirms the above findings. IMPRESSION: CTA CHEST: 1. Negative for acute pulmonary embolism. 2. Centrilobular micro nodularity and tree-in-bud opacities in the right lower lobe, likely infectious/inflammatory. 3. Similar smooth pleural thickening in the left thorax with associated small pleural effusion and pleural calcifications. Consolidation and bronchiolectasis in the left lower lung likely due to round atelectasis and fibrosis. This has slightly increased compared to 07/20/2023 and superimposed infection is not excluded. CT ABDOMEN AND PELVIS: Small bowel obstruction with abrupt transition point distal to a segment of  fecalized small bowel in the low central abdomen. Aortic Atherosclerosis (ICD10-I70.0). Electronically Signed   By: Norman Gatlin M.D.   On: 08/19/2023 23:43   CT ABDOMEN PELVIS W CONTRAST Result Date: 08/19/2023 CLINICAL DATA:  Right lower quadrant abdominal pain with nausea and vomiting, epigastric pain, shortness of breath over the last 3 days and increasing weakness. Tachypnea. Poor p.o. intake. EXAM: CT ANGIOGRAPHY CHEST CT ABDOMEN AND PELVIS WITH CONTRAST TECHNIQUE: Multidetector CT imaging of the chest was performed using the standard protocol during bolus administration of intravenous contrast. Multiplanar CT image reconstructions and MIPs were obtained to evaluate the vascular anatomy. Multidetector CT imaging of the abdomen and pelvis was performed using the standard protocol during bolus administration of intravenous contrast. RADIATION DOSE REDUCTION: This exam was performed according to the departmental dose-optimization program which includes automated exposure control, adjustment of the mA and/or kV according to patient size and/or use of iterative reconstruction technique. CONTRAST:  OMNIPAQUE  IOHEXOL  350 MG/ML SOLN COMPARISON:  Same day chest radiograph; CT 02/03/2020; CT 07/20/2023 FINDINGS: CTA CHEST FINDINGS Cardiovascular: Negative for PE. No pericardial effusion. Coronary artery and aortic atherosclerotic calcification. No aortic dissection. Lipomatous hypertrophy of the intra-atrial septum. Mediastinum/Nodes: Trachea and esophagus are unremarkable. Unchanged prominent mediastinal and hilar lymph nodes measuring up to 1.1 cm in the right hilum (series 3/image 137). Lungs/Pleura: Emphysema. Similar smooth pleural thickening in the left thorax with associated small pleural effusion and pleural calcifications. Consolidation and bronchiolectasis in the left lower lung likely due to round atelectasis and fibrosis. This has slightly increased compared to 07/20/2023 and superimposed  infection is not excluded. Diffuse bronchial wall thickening. Centrilobular micro nodularity and tree-in-bud opacities in the right lower lobe. No pneumothorax. Musculoskeletal: No acute fracture. Review of the MIP images confirms the above findings. CT ABDOMEN and PELVIS FINDINGS Hepatobiliary: No acute abnormality. Pancreas: Unremarkable. Spleen: Unremarkable. Adrenals/Urinary Tract: Normal adrenal glands. No urinary calculi or hydronephrosis. Unremarkable bladder. Stomach/Bowel: Stomach is within normal limits. Dilation of the small bowel with abrupt transition point distal to a segment of fecalized small bowel in the low central abdomen (series 6/image 60). The small bowel downstream from the transition point is decompressed. Normal caliber colon without wall thickening. Vascular/Lymphatic: Aortic atherosclerosis. No enlarged abdominal or pelvic lymph nodes. Reproductive: Unremarkable. Other: No free intraperitoneal air.  No free fluid. Musculoskeletal: No acute fracture. Chronic L5 pars defects  with grade 1 anterolisthesis. Review of the MIP images confirms the above findings. IMPRESSION: CTA CHEST: 1. Negative for acute pulmonary embolism. 2. Centrilobular micro nodularity and tree-in-bud opacities in the right lower lobe, likely infectious/inflammatory. 3. Similar smooth pleural thickening in the left thorax with associated small pleural effusion and pleural calcifications. Consolidation and bronchiolectasis in the left lower lung likely due to round atelectasis and fibrosis. This has slightly increased compared to 07/20/2023 and superimposed infection is not excluded. CT ABDOMEN AND PELVIS: Small bowel obstruction with abrupt transition point distal to a segment of fecalized small bowel in the low central abdomen. Aortic Atherosclerosis (ICD10-I70.0). Electronically Signed   By: Norman Gatlin M.D.   On: 08/19/2023 23:43   DG Chest Port 1 View Result Date: 08/19/2023 CLINICAL DATA:  Shortness of breath.   Weakness. EXAM: PORTABLE CHEST 1 VIEW COMPARISON:  Chest radiograph 11/27/2022.  CT 07/20/2023 FINDINGS: Again seen volume loss in the left hemithorax. Chronic left pleural thickening and opacity at the lung base typical of round atelectasis. Chronic left apical opacity. The heart is normal in size. Mild bronchial thickening. No pneumothorax. No pulmonary edema. IMPRESSION: 1. Chronic left lung volume loss with pleural thickening and opacity at the lung base, round atelectasis on prior CT. 2. Mild bronchial thickening. Electronically Signed   By: Andrea Gasman M.D.   On: 08/19/2023 21:49   EKG: Independently reviewed.  Sinus tachycardia at 100 bpm.  Nonspecific intraventricular conduction delay.  Nonspecific T wave changes.  QTc okay at 466.  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  Found to have SBO on CT. > Made n.p.o., NG tube placed to intermittent suction, receiving IV fluids, pain medication as needed. > General Surgery consulted and will see the patient. - Monitor on progressive for now - Appreciate general surgery recommendations and assistance - N.p.o. with NG tube for now - As needed pain medication - Continue IV fluids  Pneumonia > Began feeling unwell after conditioning when out of his home.  Developed hypoxia this morning continues to require 2 to 3 L. > Evidence of right lower lobe tree-in-bud opacities consistent with infection on  CT and leukocytosis to 11.1.  Started on ceftriaxone and azithromycin. - Continue supplemental oxygen, wean as tolerated - Continue with ceftriaxone and azithromycin - Trend fever curve and WBC  Hypertension - Hold home diltiazem , hydrochlorothiazide , losartan  while n.p.o.  Hyperlipidemia - Hold home statin rosuvastatin  while n.p.o.  GERD - Holding PPI while n.p.o., can give IV if needed  Diabetes - SSI  Narcolepsy with cataplexy - Holding home clomipramine , Ritalin , sodium oxybate  while n.p.o. - Patient does note that he has had trouble with sleepwalking when he is not taking his medication, notified nursing staff of this history so that night shift can be aware and watch for it.  Cataract Emphysema - Noted  DVT prophylaxis: SCDs for now Code Status:   Full Family Communication:  None on admission  Disposition Plan:   Patient is from:  Home  Anticipated DC to:  Home  Anticipated DC date:  2 to 5 days  Anticipated DC barriers: None  Consults called:  General surgery Admission status:  Inpatient, progressive  Severity of Illness: The appropriate patient status for this patient is INPATIENT. Inpatient status is judged to be reasonable and necessary in order to provide the required intensity of service to ensure the patient's safety. The patient's presenting symptoms, physical exam findings, and initial radiographic and laboratory data in the context of their chronic comorbidities is felt to place them at high risk for further clinical deterioration. Furthermore, it is not anticipated that the patient will be medically stable for discharge from the hospital within 2 midnights of admission.   * I certify that at the point of admission it is my clinical judgment that the patient will require inpatient hospital care spanning beyond 2 midnights from the point of admission due to high intensity of service, high risk for further deterioration and high frequency of surveillance  required.DEWAINE Marsa KATHEE Seena MD [facility]  How to contact the [facility] Attending or Consulting provider 7A - 7P or covering provider during after hours 7P -7A, for this patient?   Check the care team in [facility] and look for a) attending/consulting [facility] provider listed and b) the [facility] team listed Log into [email or web address] and use [facility]'s universal password to access. If you do not have the password, please contact the hospital operator. Locate the [facility] provider you are looking for under [facility] and page to a number that you can be directly reached. If you still have difficulty reaching the provider, please page the [facility] (Director on Call) for the Hospitalists listed on amion for assistance.  08/20/2023, 2:36 PM

## 2023-08-20 NOTE — Plan of Care (Signed)

## 2023-08-20 NOTE — Consult Note (Signed)
 Reason for Consult:abdominal pain Referring Physician: Dr. Melvin  Patrick Lam is an 78 y.o. male.  HPI: The patient is an 78 year old white male who presents with abdominal pain that started about 4 days ago.  The pain was associated with some nausea and vomiting.  The pain progressed to the point where last night he went to the emergency department.  Imaging was consistent with a small bowel obstruction.  An NG tube was placed and he was transferred here for further care.  During his interview now he feels quite a bit better.  His abdominal pain is significantly improved.  He has not passed any flatus in the last couple days.  Past Medical History:  Diagnosis Date   Arthritis    Hypertension    Narcolepsy    Narcolepsy and cataplexy    Pre-diabetes    Shock circulatory (HCC) 02/05/2020    Past Surgical History:  Procedure Laterality Date   COLON RESECTION N/A 02/01/2020   Procedure: DIAGNOSTIC  LAPAROSCOPY;  Surgeon: Patrick Shoulders, MD;  Location: [facility];  Service: General;  Laterality: N/A;   INGUINAL HERNIA REPAIR Right 01/24/2020   Procedure: REPAIR INGUINAL INCARCERATED HERNIA WITH MESH;  Surgeon: Patrick Berg, MD;  Location: [facility];  Service: General;  Laterality: Right;   LAPAROSCOPIC LYSIS OF ADHESIONS N/A 02/01/2020   Procedure: LAPAROSCOPIC LYSIS OF ADHESIONS;  Surgeon: Patrick Shoulders, MD;  Location: [facility];  Service: General;  Laterality: N/A;   MOUTH SURGERY     TOTAL KNEE ARTHROPLASTY Left 02/02/2018   Procedure: TOTAL KNEE ARTHROPLASTY;  Surgeon: Ernie Cough, MD;  Location: [facility];  Service: Orthopedics;  Laterality: Left;     History reviewed. No pertinent family history.  Social History:  reports that he has been smoking cigars. He has never used smokeless tobacco. He reports that he does not drink alcohol and does not use drugs.  Allergies: No Known Allergies  Medications: I have reviewed the patient's current medications.  Results for orders placed  or performed during the hospital encounter of 08/19/23 (from the past 48 hours)  Urinalysis, Routine w reflex microscopic -Urine, Clean Catch     Status: Abnormal   Collection Time: 08/19/23  8:53 PM  Result Value Ref Range   Color, Urine YELLOW YELLOW   APPearance CLEAR CLEAR   Specific Gravity, Urine 1.046 (H) 1.005 - 1.030   pH 6.5 5.0 - 8.0   Glucose, UA NEGATIVE NEGATIVE mg/dL   Hgb urine dipstick NEGATIVE NEGATIVE   Bilirubin Urine NEGATIVE NEGATIVE   Ketones, ur NEGATIVE NEGATIVE mg/dL   Protein, ur NEGATIVE NEGATIVE mg/dL   Nitrite NEGATIVE NEGATIVE   Leukocytes,Ua NEGATIVE NEGATIVE    Comment: Performed at [facility], [address] [place], [place] [address]  Lipase, blood     Status: None   Collection Time: 08/19/23  9:12 PM  Result Value Ref Range   Lipase 13 11 - 51 U/L    Comment: Performed at [facility], [address] [place], [place] [address]  Comprehensive metabolic panel     Status: Abnormal   Collection Time: 08/19/23  9:12 PM  Result Value Ref Range   Sodium 138 135 - 145 mmol/L    Comment: Electrolytes repeated to confirm.   Potassium 4.2 3.5 - 5.1 mmol/L   Chloride 93 (L) 98 - 111 mmol/L   CO2 24 22 - 32 mmol/L   Glucose, Bld 192 (H) 70 - 99 mg/dL    Comment: Glucose reference range applies  only to samples taken after fasting for at least 8 hours.   BUN 18 8 - 23 mg/dL   Creatinine, Ser 8.80 0.61 - 1.24 mg/dL   Calcium  11.3 (H) 8.9 - 10.3 mg/dL   Total Protein 8.6 (H) 6.5 - 8.1 g/dL   Albumin 4.1 3.5 - 5.0 g/dL   AST 31 15 - 41 U/L   ALT 15 0 - 44 U/L   Alkaline Phosphatase 65 38 - 126 U/L   Total Bilirubin 0.6 0.0 - 1.2 mg/dL   GFR, Estimated >39 >39 mL/min    Comment: (NOTE) Calculated using the CKD-EPI Creatinine Equation (2021)    Anion gap 21 (H) 5 - 15    Comment: Performed at [facility], [address] [place], [place] [address]  CBC     Status: Abnormal   Collection  Time: 08/19/23  9:12 PM  Result Value Ref Range   WBC 11.1 (H) 4.0 - 10.5 K/uL   RBC 7.28 (H) 4.22 - 5.81 MIL/uL   Hemoglobin 17.0 13.0 - 17.0 g/dL   HCT 47.5 (H) 60.9 - 47.9 %   MCV 72.0 (L) 80.0 - 100.0 fL   MCH 23.4 (L) 26.0 - 34.0 pg   MCHC 32.4 30.0 - 36.0 g/dL   RDW 81.5 (H) 88.4 - 84.4 %   Platelets 408 (H) 150 - 400 K/uL   nRBC 0.0 0.0 - 0.2 %    Comment: Performed at [facility], [address] [place], [place] [address]  Troponin T, High Sensitivity     Status: None   Collection Time: 08/19/23  9:12 PM  Result Value Ref Range   Troponin T High Sensitivity 18 <19 ng/L    Comment: (NOTE) Biotin concentrations > 1000 ng/mL falsely decrease TnT results.  Serial cardiac troponin measurements are suggested.  Refer to the Links section for chest pain algorithms and additional  guidance. Performed at [facility], [address], [place], [place] [address]   Pro Brain natriuretic peptide     Status: Abnormal   Collection Time: 08/19/23  9:12 PM  Result Value Ref Range   Pro Brain Natriuretic Peptide 356.0 (H) <300.0 pg/mL    Comment: (NOTE) Age Group        Cut-Points    Interpretation  < 50 years     450 pg/mL       NT-proBNP > 450 pg/mL indicates                                ADHF is likely              50 to 75 years  900 pg/mL      NT-proBNP > 900 pg/mL indicates          ADHF is likely  > 75 years      1800 pg/mL     NT-proBNP > 1800 pg/mL indicates          ADHF is likely                           All ages    Results between       Indeterminate. Further clinical             300 and the cut-   information is needed to determine            point for  age group   if ADHF is present.                                                             Elecsys proBNP II/ Elecsys proBNP II STAT           Cut-Point                       Interpretation  300 pg/mL                    NT-proBNP <300pg/mL indicates                              ADHF is not likely  Performed at [facility], [address] [place], [place] [address]   CK     Status: Abnormal   Collection Time: 08/19/23  9:12 PM  Result Value Ref Range   Total CK 38 (L) 49 - 397 U/L    Comment: Performed at [facility], [address], [place], [place] [address]  Troponin T, High Sensitivity     Status: None   Collection Time: 08/19/23  9:42 PM  Result Value Ref Range   Troponin T High Sensitivity 16 <19 ng/L    Comment: (NOTE) Biotin concentrations > 1000 ng/mL falsely decrease TnT results.  Serial cardiac troponin measurements are suggested.  Refer to the Links section for chest pain algorithms and additional  guidance. Performed at [facility], [address] [place], [place] [address]   Resp panel by RT-PCR (RSV, Flu A&B, Covid) Anterior Nasal Swab     Status: None   Collection Time: 08/20/23 12:04 AM   Specimen: Anterior Nasal Swab  Result Value Ref Range   SARS Coronavirus 2 by RT PCR NEGATIVE NEGATIVE    Comment: (NOTE) SARS-CoV-2 target nucleic acids are NOT DETECTED.  The SARS-CoV-2 RNA is generally detectable in upper respiratory specimens during the acute phase of infection. The lowest concentration of SARS-CoV-2 viral copies this assay can detect is 138 copies/mL. A negative result does not preclude SARS-Cov-2 infection and should not be used as the sole basis for treatment or other patient management decisions. A negative result may occur with  improper specimen collection/handling, submission of specimen other than nasopharyngeal swab, presence of viral mutation(s) within the areas targeted by this assay, and inadequate number of viral copies(<138 copies/mL). A negative result must be combined with clinical observations, patient history, and epidemiological information. The expected result is Negative.  Fact Sheet for Patients:   BloggerCourse.com  Fact Sheet for Healthcare Providers:  SeriousBroker.it  This test is no t yet approved or cleared by the [place]  FDA and  has been authorized for detection and/or diagnosis of SARS-CoV-2 by FDA under an Emergency Use Authorization (EUA). This EUA will remain  in effect (meaning this test can be used) for the duration of the COVID-19 declaration under Section 564(b)(1) of the Act, 21 U.S.C.section 360bbb-3(b)(1), unless the authorization is terminated  or revoked sooner.       Influenza A by PCR NEGATIVE NEGATIVE   Influenza B by PCR NEGATIVE NEGATIVE    Comment: (NOTE) The Xpert Xpress SARS-CoV-2/FLU/RSV plus assay is intended as an aid  in the diagnosis of influenza from Nasopharyngeal swab specimens and should not be used as a sole basis for treatment. Nasal washings and aspirates are unacceptable for Xpert Xpress SARS-CoV-2/FLU/RSV testing.  Fact Sheet for Patients: BloggerCourse.com  Fact Sheet for Healthcare Providers: SeriousBroker.it  This test is not yet approved or cleared by the [place]  FDA and has been authorized for detection and/or diagnosis of SARS-CoV-2 by FDA under an Emergency Use Authorization (EUA). This EUA will remain in effect (meaning this test can be used) for the duration of the COVID-19 declaration under Section 564(b)(1) of the Act, 21 U.S.C. section 360bbb-3(b)(1), unless the authorization is terminated or revoked.     Resp Syncytial Virus by PCR NEGATIVE NEGATIVE    Comment: (NOTE) Fact Sheet for Patients: BloggerCourse.com  Fact Sheet for Healthcare Providers: SeriousBroker.it  This test is not yet approved or cleared by the [place]  FDA and has been authorized for detection and/or diagnosis of SARS-CoV-2 by FDA under an Emergency Use Authorization (EUA).  This EUA will remain in effect (meaning this test can be used) for the duration of the COVID-19 declaration under Section 564(b)(1) of the Act, 21 U.S.C. section 360bbb-3(b)(1), unless the authorization is terminated or revoked.  Performed at [facility], [address], [place], [place] [address]   Lactic acid, plasma     Status: None   Collection Time: 08/20/23 12:48 AM  Result Value Ref Range   Lactic Acid, Venous 1.3 0.5 - 1.9 mmol/L    Comment: Performed at [facility], [address] [place], [place] [address]    DG Abdomen 1 View Result Date: 08/20/2023 CLINICAL DATA:  NG tube placement EXAM: ABDOMEN - 1 VIEW COMPARISON:  CT abdomen pelvis 08/19/2023 FINDINGS: Enteric tube tip and side-port in the stomach. Similar dilated loops of small bowel in the upper abdomen. IMPRESSION: Enteric tube tip and side-port in the stomach. Electronically Signed   By: Norman Gatlin M.D.   On: 08/20/2023 03:42   CT Angio Chest PE W and/or Wo Contrast Result Date: 08/19/2023 CLINICAL DATA:  Right lower quadrant abdominal pain with nausea and vomiting, epigastric pain, shortness of breath over the last 3 days and increasing weakness. Tachypnea. Poor p.o. intake. EXAM: CT ANGIOGRAPHY CHEST CT ABDOMEN AND PELVIS WITH CONTRAST TECHNIQUE: Multidetector CT imaging of the chest was performed using the standard protocol during bolus administration of intravenous contrast. Multiplanar CT image reconstructions and MIPs were obtained to evaluate the vascular anatomy. Multidetector CT imaging of the abdomen and pelvis was performed using the standard protocol during bolus administration of intravenous contrast. RADIATION DOSE REDUCTION: This exam was performed according to the departmental dose-optimization program which includes automated exposure control, adjustment of the mA and/or kV according to patient size and/or use of iterative reconstruction technique. CONTRAST:   OMNIPAQUE  IOHEXOL  350 MG/ML SOLN COMPARISON:  Same day chest radiograph; CT 02/03/2020; CT 07/20/2023 FINDINGS: CTA CHEST FINDINGS Cardiovascular: Negative for PE. No pericardial effusion. Coronary artery and aortic atherosclerotic calcification. No aortic dissection. Lipomatous hypertrophy of the intra-atrial septum. Mediastinum/Nodes: Trachea and esophagus are unremarkable. Unchanged prominent mediastinal and hilar lymph nodes measuring up to 1.1 cm in the right hilum (series 3/image 137). Lungs/Pleura: Emphysema. Similar smooth pleural thickening in the left thorax with associated small pleural effusion and pleural calcifications. Consolidation and bronchiolectasis in the left lower lung likely due to round atelectasis and fibrosis. This has slightly increased compared to 07/20/2023 and superimposed infection is not excluded. Diffuse bronchial wall thickening. Centrilobular micro  nodularity and tree-in-bud opacities in the right lower lobe. No pneumothorax. Musculoskeletal: No acute fracture. Review of the MIP images confirms the above findings. CT ABDOMEN and PELVIS FINDINGS Hepatobiliary: No acute abnormality. Pancreas: Unremarkable. Spleen: Unremarkable. Adrenals/Urinary Tract: Normal adrenal glands. No urinary calculi or hydronephrosis. Unremarkable bladder. Stomach/Bowel: Stomach is within normal limits. Dilation of the small bowel with abrupt transition point distal to a segment of fecalized small bowel in the low central abdomen (series 6/image 60). The small bowel downstream from the transition point is decompressed. Normal caliber colon without wall thickening. Vascular/Lymphatic: Aortic atherosclerosis. No enlarged abdominal or pelvic lymph nodes. Reproductive: Unremarkable. Other: No free intraperitoneal air.  No free fluid. Musculoskeletal: No acute fracture. 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 Review of the MIP images confirms the above findings. IMPRESSION: CTA CHEST: 1. Negative  for acute pulmonary embolism. 2. Centrilobular micro nodularity and tree-in-bud opacities in the right lower lobe, likely infectious/inflammatory. 3. Similar smooth pleural thickening in the left thorax with associated small pleural effusion and pleural calcifications. Consolidation and bronchiolectasis in the left lower lung likely due to round atelectasis and fibrosis. This has slightly increased compared to 07/20/2023 and superimposed infection is not excluded. CT ABDOMEN AND PELVIS: Small bowel obstruction with abrupt transition point distal to a segment of fecalized small bowel in the low central abdomen. Aortic Atherosclerosis (ICD10-I70.0). Electronically Signed   By: Norman Gatlin M.D.   On: 08/19/2023 23:43   CT ABDOMEN PELVIS W CONTRAST Result Date: 08/19/2023 CLINICAL DATA:  Right lower quadrant abdominal pain with nausea and vomiting, epigastric pain, shortness of breath over the last 3 days and increasing weakness. Tachypnea. Poor p.o. intake. EXAM: CT ANGIOGRAPHY CHEST CT ABDOMEN AND PELVIS WITH CONTRAST TECHNIQUE: Multidetector CT imaging of the chest was performed using the standard protocol during bolus administration of intravenous contrast. Multiplanar CT image reconstructions and MIPs were obtained to evaluate the vascular anatomy. Multidetector CT imaging of the abdomen and pelvis was performed using the standard protocol during bolus administration of intravenous contrast. RADIATION DOSE REDUCTION: This exam was performed according to the departmental dose-optimization program which includes automated exposure control, adjustment of the mA and/or kV according to patient size and/or use of iterative reconstruction technique. CONTRAST:  OMNIPAQUE  IOHEXOL  350 MG/ML SOLN COMPARISON:  Same day chest radiograph; CT 02/03/2020; CT 07/20/2023 FINDINGS: CTA CHEST FINDINGS Cardiovascular: Negative for PE. No pericardial effusion. Coronary artery and aortic atherosclerotic calcification. No  aortic dissection. Lipomatous hypertrophy of the intra-atrial septum. Mediastinum/Nodes: Trachea and esophagus are unremarkable. Unchanged prominent mediastinal and hilar lymph nodes measuring up to 1.1 cm in the right hilum (series 3/image 137). Lungs/Pleura: Emphysema. Similar smooth pleural thickening in the left thorax with associated small pleural effusion and pleural calcifications. Consolidation and bronchiolectasis in the left lower lung likely due to round atelectasis and fibrosis. This has slightly increased compared to 07/20/2023 and superimposed infection is not excluded. Diffuse bronchial wall thickening. Centrilobular micro nodularity and tree-in-bud opacities in the right lower lobe. No pneumothorax. Musculoskeletal: No acute fracture. Review of the MIP images confirms the above findings. CT ABDOMEN and PELVIS FINDINGS Hepatobiliary: No acute abnormality. Pancreas: Unremarkable. Spleen: Unremarkable. Adrenals/Urinary Tract: Normal adrenal glands. No urinary calculi or hydronephrosis. Unremarkable bladder. Stomach/Bowel: Stomach is within normal limits. Dilation of the small bowel with abrupt transition point distal to a segment of fecalized small bowel in the low central abdomen (series 6/image 60). The small bowel downstream from the transition point is decompressed. Normal caliber colon without wall  thickening. Vascular/Lymphatic: Aortic atherosclerosis. No enlarged abdominal or pelvic lymph nodes. Reproductive: Unremarkable. Other: No free intraperitoneal air.  No free fluid. Musculoskeletal: No acute fracture. 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 Review of the MIP images confirms the above findings. IMPRESSION: CTA CHEST: 1. Negative for acute pulmonary embolism. 2. Centrilobular micro nodularity and tree-in-bud opacities in the right lower lobe, likely infectious/inflammatory. 3. Similar smooth pleural thickening in the left thorax with associated small pleural effusion and  pleural calcifications. Consolidation and bronchiolectasis in the left lower lung likely due to round atelectasis and fibrosis. This has slightly increased compared to 07/20/2023 and superimposed infection is not excluded. CT ABDOMEN AND PELVIS: Small bowel obstruction with abrupt transition point distal to a segment of fecalized small bowel in the low central abdomen. Aortic Atherosclerosis (ICD10-I70.0). Electronically Signed   By: Norman Gatlin M.D.   On: 08/19/2023 23:43   DG Chest Port 1 View Result Date: 08/19/2023 CLINICAL DATA:  Shortness of breath.  Weakness. EXAM: PORTABLE CHEST 1 VIEW COMPARISON:  Chest radiograph 11/27/2022.  CT 07/20/2023 FINDINGS: Again seen volume loss in the left hemithorax. Chronic left pleural thickening and opacity at the lung base typical of round atelectasis. Chronic left apical opacity. The heart is normal in size. Mild bronchial thickening. No pneumothorax. No pulmonary edema. IMPRESSION: 1. Chronic left lung volume loss with pleural thickening and opacity at the lung base, round atelectasis on prior CT. 2. Mild bronchial thickening. Electronically Signed   By: Andrea Gasman M.D.   On: 08/19/2023 21:49    Review of Systems  Constitutional: Negative.   HENT: Negative.    Eyes: Negative.   Respiratory: Negative.    Cardiovascular: Negative.   Gastrointestinal:  Positive for abdominal pain, nausea and vomiting.  Endocrine: Negative.   Genitourinary: Negative.   Musculoskeletal: Negative.   Skin: Negative.   Allergic/Immunologic: Negative.   Neurological: Negative.   Hematological: Negative.   Psychiatric/Behavioral: Negative.     Blood pressure (!) 154/88, pulse (!) 104, temperature 98.7 F (37.1 C), temperature source Oral, resp. rate (!) 21, height 6' 1 (1.854 m), weight 92.1 kg, SpO2 94%. Physical Exam Vitals reviewed.  Constitutional:      General: He is not in acute distress.    Appearance: Normal appearance.  HENT:     Head: Normocephalic  and atraumatic.     Right Ear: External ear normal.     Left Ear: External ear normal.     Nose: Nose normal.     Mouth/Throat:     Mouth: Mucous membranes are dry.     Pharynx: Oropharynx is clear.  Eyes:     General: No scleral icterus.    Extraocular Movements: Extraocular movements intact.     Conjunctiva/sclera: Conjunctivae normal.     Pupils: Pupils are equal, round, and reactive to light.  Cardiovascular:     Rate and Rhythm: Normal rate and regular rhythm.     Pulses: Normal pulses.     Heart sounds: Normal heart sounds.  Pulmonary:     Effort: Pulmonary effort is normal. No respiratory distress.     Breath sounds: Normal breath sounds.  Abdominal:     Palpations: Abdomen is soft.     Comments: There is minimal distention.  There is no significant tenderness.  Musculoskeletal:        General: No swelling or deformity. Normal range of motion.     Cervical back: Normal range of motion and neck supple. No rigidity.  Skin:  General: Skin is warm and dry.     Coloration: Skin is not jaundiced.  Neurological:     General: No focal deficit present.     Mental Status: He is alert and oriented to person, place, and time.  Psychiatric:        Mood and Affect: Mood normal.        Behavior: Behavior normal.     Assessment/Plan: The patient appears to have a small bowel obstruction likely secondary to scar tissue from his previous surgery.  At this point I would agree with bowel rest and NG tube decompression.  He will need IV hydration.  We will start him on the small bowel protocol and monitor him closely.  If he does not improve over the next couple days then he may require exploration.  I have discussed this with him and his family and they understand and agree with the treatment plan  Patrick Lam 08/20/2023, 2:50 PM

## 2023-08-20 NOTE — ED Notes (Signed)
 Flushed NG/OG tube at 0725.[email or web address]

## 2023-08-20 NOTE — Plan of Care (Signed)
 Plan of Care Note for accepted transfer   Patient name: Patrick Lam FMW:[id number] DOB: October 12, 1945  Facility requesting transfer: [facility] ED Requesting Provider: Dr. Nettie Facility course: 78 year old male with history of hypertension, prediabetes, narcolepsy and cataplexy, arthritis presented to ED with complaints of abdominal pain, nausea, vomiting, and shortness of breath.  Tachycardic on arrival but afebrile.  Not hypotensive.  Oxygen saturation in the mid 80s on room air, improved with 3 L [place].  Labs notable for WBC count 11.1, glucose 192, calcium  11.3, albumin 4.1, normal LFTs, normal lipase, troponin negative x 2, proBNP 356, COVID/influenza/RSV PCR negative, lactic acid normal.  CTA chest and CT abdomen pelvis showing: IMPRESSION: CTA CHEST:   1. Negative for acute pulmonary embolism. 2. Centrilobular micro nodularity and tree-in-bud opacities in the right lower lobe, likely infectious/inflammatory. 3. Similar smooth pleural thickening in the left thorax with associated small pleural effusion and pleural calcifications. Consolidation and bronchiolectasis in the left lower lung likely due to round atelectasis and fibrosis. This has slightly increased compared to 07/20/2023 and superimposed infection is not excluded.   CT ABDOMEN AND PELVIS:   Small bowel obstruction with abrupt transition point distal to a segment of fecalized small bowel in the low central abdomen.   Aortic Atherosclerosis (ICD10-I70.0).  EDP spoke to Dr. Teresa on-call for general surgery at [facility] and NG tube placed.  General surgery will consult.  Patient was given Zofran , ceftriaxone, azithromycin, and IV fluids.  Plan of care: The patient is accepted for admission to Progressive unit at [facility].  [facility] will assume care on arrival to accepting facility. Until arrival, care as per EDP. However, [facility] available 24/7 for questions and assistance.  Check [email or web address] for on-call  coverage.  Nursing staff, please call [facility] Admits & Consults System-Wide number under Amion on patient's arrival so appropriate admitting provider can evaluate the pt.

## 2023-08-20 NOTE — ED Provider Notes (Signed)
 Patient was observed this morning.  Patient being admitted for small bowel obstruction.  He complained of some abdominal pain and nausea.  Patient has received fentanyl , which was reordered.  Zofran  given for nausea.  Pain already improved.  Patient already has a bed.  I have discussed with him the findings and the plan for him to be transferred as soon as he is assigned a clean bed and transportation can get here to pick him up.   Charlyn Sora, MD 08/20/23 [phone]

## 2023-08-20 NOTE — ED Notes (Signed)
 Pt ambulated to and from bathroom with standby assist to prevent a fall. Tolerated well. Provided urine sample. Placed back on monitor. L NGT connected to LIS. Brown drainage draining into canister. Awaiting bed placement.

## 2023-08-20 NOTE — ED Notes (Signed)
Tequila with  cl called for transport 

## 2023-08-21 ENCOUNTER — Inpatient Hospital Stay (HOSPITAL_COMMUNITY)

## 2023-08-21 DIAGNOSIS — K56609 Unspecified intestinal obstruction, unspecified as to partial versus complete obstruction: Secondary | ICD-10-CM | POA: Diagnosis not present

## 2023-08-21 LAB — CBC
HCT: 39.9 % (ref 39.0–52.0)
Hemoglobin: 12.3 g/dL — ABNORMAL LOW (ref 13.0–17.0)
MCH: 23 pg — ABNORMAL LOW (ref 26.0–34.0)
MCHC: 30.8 g/dL (ref 30.0–36.0)
MCV: 74.6 fL — ABNORMAL LOW (ref 80.0–100.0)
Platelets: 281 K/uL (ref 150–400)
RBC: 5.35 MIL/uL (ref 4.22–5.81)
RDW: 16 % — ABNORMAL HIGH (ref 11.5–15.5)
WBC: 5.8 K/uL (ref 4.0–10.5)
nRBC: 0 % (ref 0.0–0.2)

## 2023-08-21 LAB — COMPREHENSIVE METABOLIC PANEL WITH GFR
ALT: 15 U/L (ref 0–44)
AST: 21 U/L (ref 15–41)
Albumin: 2.2 g/dL — ABNORMAL LOW (ref 3.5–5.0)
Alkaline Phosphatase: 29 U/L — ABNORMAL LOW (ref 38–126)
Anion gap: 5 (ref 5–15)
BUN: 16 mg/dL (ref 8–23)
CO2: 34 mmol/L — ABNORMAL HIGH (ref 22–32)
Calcium: 8.5 mg/dL — ABNORMAL LOW (ref 8.9–10.3)
Chloride: 102 mmol/L (ref 98–111)
Creatinine, Ser: 0.9 mg/dL (ref 0.61–1.24)
GFR, Estimated: >60 mL/min (ref >60)
Glucose, Bld: 106 mg/dL — ABNORMAL HIGH (ref 70–99)
Potassium: 3.6 mmol/L (ref 3.5–5.1)
Sodium: 141 mmol/L (ref 135–145)
Total Bilirubin: <0.2 mg/dL (ref 0.0–1.2)
Total Protein: 5.5 g/dL — ABNORMAL LOW (ref 6.5–8.1)

## 2023-08-21 LAB — GLUCOSE, CAPILLARY
Glucose-Capillary: 133 mg/dL — ABNORMAL HIGH (ref 70–99)
Glucose-Capillary: 96 mg/dL (ref 70–99)
Glucose-Capillary: 104 mg/dL — ABNORMAL HIGH (ref 70–99)
Glucose-Capillary: 137 mg/dL — ABNORMAL HIGH (ref 70–99)
Glucose-Capillary: 107 mg/dL — ABNORMAL HIGH (ref 70–99)
Glucose-Capillary: 121 mg/dL — ABNORMAL HIGH (ref 70–99)

## 2023-08-21 LAB — MAGNESIUM: Magnesium: 1.8 mg/dL (ref 1.7–2.4)

## 2023-08-21 MED ORDER — PANTOPRAZOLE SODIUM 40 MG IV SOLR
40.0000 mg | INTRAVENOUS | Status: DC
  Administered 2023-08-21 – 2023-08-22 (×2): 40 mg via INTRAVENOUS
  Filled 2023-08-21 (×2): qty 10

## 2023-08-21 MED ORDER — POTASSIUM CL IN DEXTROSE 5% 20 MEQ/L IV SOLN
20.0000 meq | INTRAVENOUS | Status: AC
  Administered 2023-08-21 (×2): 20 meq via INTRAVENOUS
  Filled 2023-08-21 (×2): qty 1000

## 2023-08-21 MED ORDER — HYDRALAZINE HCL 20 MG/ML IJ SOLN: 10.0000 mg | Freq: Four times a day (QID) | INTRAMUSCULAR | Status: DC | PRN

## 2023-08-21 MED ORDER — SODIUM CHLORIDE 0.9 % IV SOLN
3.0000 g | Freq: Three times a day (TID) | INTRAVENOUS | Status: DC
  Administered 2023-08-21 – 2023-08-23 (×7): 3 g via INTRAVENOUS
  Filled 2023-08-21 (×7): qty 8

## 2023-08-21 MED ORDER — HEPARIN SODIUM (PORCINE) 5000 UNIT/ML IJ SOLN
5000.0000 [IU] | Freq: Three times a day (TID) | INTRAMUSCULAR | Status: DC
  Administered 2023-08-21 – 2023-08-23 (×6): 5000 [IU] via SUBCUTANEOUS
  Filled 2023-08-21 (×6): qty 1

## 2023-08-21 NOTE — Progress Notes (Signed)
 Transition ceftriaxone jennell to Unasyn  to complete 5d per Dr. Dennise.  Sergio Batch, PharmD, BCIDP, AAHIVP, CPP Infectious Disease Pharmacist 08/21/2023 9:19 AM

## 2023-08-21 NOTE — Progress Notes (Signed)
 Patient refused for ambulation and sit up in the chair right now.Will follow back again.

## 2023-08-21 NOTE — Progress Notes (Signed)
 Subjective/Chief Complaint: Had BM contrast in the colon   Objective: Vital signs in last 24 hours: Temp:  [97.1 F (36.2 C)-98.7 F (37.1 C)] 97.1 F (36.2 C) (07/04 0827) Pulse Rate:  [92-104] 95 (07/04 0827) Resp:  [16-24] 19 (07/04 0827) BP: (110-154)/(72-89) 110/80 (07/04 0827) SpO2:  [88 %-97 %] 88 % (07/04 0827) Last BM Date : 08/21/23  Intake/Output from previous day: 07/03 0701 - 07/04 0700 In: 60 [NG/GT:60] Out: 850 [Emesis/NG output:850] Intake/Output this shift: Total I/O In: 3 [I.V.:3] Out: -   Abd: distended but soft NT  Lab Results:  Recent Labs    08/20/23 1507 08/21/23 0459  WBC 6.3 5.8  HGB 14.1 12.3*  HCT 44.6 39.9  PLT 327 281   BMET Recent Labs    08/20/23 1507 08/21/23 0459  NA 140 141  K 4.2 3.6  CL 98 102  CO2 30 34*  GLUCOSE 146* 106*  BUN 15 16  CREATININE 1.00 0.90  CALCIUM  9.2 8.5*   PT/INR No results for input(s): LABPROT, INR in the last 72 hours. ABG No results for input(s): PHART, HCO3 in the last 72 hours.  Invalid input(s): PCO2, PO2  Studies/Results: DG Chest Port 1 View Result Date: 08/21/2023 CLINICAL DATA:  : 78 year old male with possible small obstruction. Shortness of breath. EXAM: PORTABLE CHEST 1 VIEW COMPARISON:  CTA chest 08/19/2023 and earlier. FINDINGS: Portable AP semi upright view at 0604 hours. Enteric tube terminating in the epigastrium as seen on abdominal radiographs today. Stable lung volumes and mediastinal contours. Left lung base fibrothorax. No superimposed pneumothorax, pulmonary edema, or new pulmonary opacity. Stable visualized osseous structures. IMPRESSION: 1. Left lung base fibrothorax. No new cardiopulmonary abnormality identified. 2. Enteric tube terminating in the epigastrium. Electronically Signed   By: VEAR Hurst M.D.   On: 08/21/2023 07:05   DG Abd Portable 1V Result Date: 08/21/2023 CLINICAL DATA:  78 year old male with possible small bowel obstruction. EXAM: PORTABLE  ABDOMEN - 1 VIEW COMPARISON:  8 hour delayed images 0149 hours today and earlier. FINDINGS: Portable AP supine view at 0606 hours. Oral contrast throughout the large bowel to the rectum. Excreted IV contrast in the bladder. Improved but unresolved mid abdominal small bowel mildly gas distended loop with wall thickening. Stable enteric tube terminating in the stomach. Stable visualized osseous structures. IMPRESSION: 1. Oral contrast throughout the large bowel to the rectum. 2. Improved but not completely normalized mid abdominal small bowel appearance since the CT on 08/19/2023. 3. Stable enteric tube. Electronically Signed   By: VEAR Hurst M.D.   On: 08/21/2023 07:02   DG Abd Portable 1V-Small Bowel Obstruction Protocol-initial, 8 hr delay Result Date: 08/21/2023 CLINICAL DATA:  78 year old male with possible small bowel obstruction, 8 hour delayed post oral contrast images. EXAM: PORTABLE ABDOMEN - 1 VIEW COMPARISON:  08/20/2023 radiographs, CT Abdomen and Pelvis 08/19/2023. FINDINGS: Three supine views at 0149 hours. Stable enteric tube. No significant residual oral contrast in the stomach. And oral contrast now present throughout the large bowel to the sigmoid, possibly the rectum. Superimposed excreted IV contrast in the urinary bladder in the central pelvis. Small bowel gas pattern also mildly improved compared to the CT on 08/19/2023. Left lung base fibrothorax redemonstrated. Degenerative changes in the spine. IMPRESSION: 1. Oral contrast now present throughout the large bowel to the distal colon. Small bowel-gas pattern also appears improved from the scout view on 08/19/2023. 2. Stable enteric tube. Excreted IV contrast in the urinary bladder. 3. Left lung base  fibrothorax. Electronically Signed   By: VEAR Hurst M.D.   On: 08/21/2023 06:03   DG Abd Portable 1V Result Date: 08/20/2023 CLINICAL DATA:  Nasogastric tube placement. NG tube tape is a different measurement than initially recorded, concern for shift.  EXAM: PORTABLE ABDOMEN - 1 VIEW COMPARISON:  Radiograph earlier today FINDINGS: Tip of the enteric tube in the stomach. There is enteric contrast in the stomach which obscures assessment of the side-port. Enteric contrast is also seen within dilated bowel in the upper abdomen. IMPRESSION: Tip of the enteric tube in the stomach. The side-port is obscured by enteric contrast. Electronically Signed   By: Andrea Gasman M.D.   On: 08/20/2023 18:43   DG Abdomen 1 View Result Date: 08/20/2023 CLINICAL DATA:  NG tube placement EXAM: ABDOMEN - 1 VIEW COMPARISON:  CT abdomen pelvis 08/19/2023 FINDINGS: Enteric tube tip and side-port in the stomach. Similar dilated loops of small bowel in the upper abdomen. IMPRESSION: Enteric tube tip and side-port in the stomach. Electronically Signed   By: Norman Gatlin M.D.   On: 08/20/2023 03:42   CT Angio Chest PE W and/or Wo Contrast Result Date: 08/19/2023 CLINICAL DATA:  Right lower quadrant abdominal pain with nausea and vomiting, epigastric pain, shortness of breath over the last 3 days and increasing weakness. Tachypnea. Poor p.o. intake. EXAM: CT ANGIOGRAPHY CHEST CT ABDOMEN AND PELVIS WITH CONTRAST TECHNIQUE: Multidetector CT imaging of the chest was performed using the standard protocol during bolus administration of intravenous contrast. Multiplanar CT image reconstructions and MIPs were obtained to evaluate the vascular anatomy. Multidetector CT imaging of the abdomen and pelvis was performed using the standard protocol during bolus administration of intravenous contrast. RADIATION DOSE REDUCTION: This exam was performed according to the departmental dose-optimization program which includes automated exposure control, adjustment of the mA and/or kV according to patient size and/or use of iterative reconstruction technique. CONTRAST:  OMNIPAQUE  IOHEXOL  350 MG/ML SOLN COMPARISON:  Same day chest radiograph; CT 02/03/2020; CT 07/20/2023 FINDINGS: CTA CHEST FINDINGS  Cardiovascular: Negative for PE. No pericardial effusion. Coronary artery and aortic atherosclerotic calcification. No aortic dissection. Lipomatous hypertrophy of the intra-atrial septum. Mediastinum/Nodes: Trachea and esophagus are unremarkable. Unchanged prominent mediastinal and hilar lymph nodes measuring up to 1.1 cm in the right hilum (series 3/image 137). Lungs/Pleura: Emphysema. Similar smooth pleural thickening in the left thorax with associated small pleural effusion and pleural calcifications. Consolidation and bronchiolectasis in the left lower lung likely due to round atelectasis and fibrosis. This has slightly increased compared to 07/20/2023 and superimposed infection is not excluded. Diffuse bronchial wall thickening. Centrilobular micro nodularity and tree-in-bud opacities in the right lower lobe. No pneumothorax. Musculoskeletal: No acute fracture. Review of the MIP images confirms the above findings. CT ABDOMEN and PELVIS FINDINGS Hepatobiliary: No acute abnormality. Pancreas: Unremarkable. Spleen: Unremarkable. Adrenals/Urinary Tract: Normal adrenal glands. No urinary calculi or hydronephrosis. Unremarkable bladder. Stomach/Bowel: Stomach is within normal limits. Dilation of the small bowel with abrupt transition point distal to a segment of fecalized small bowel in the low central abdomen (series 6/image 60). The small bowel downstream from the transition point is decompressed. Normal caliber colon without wall thickening. Vascular/Lymphatic: Aortic atherosclerosis. No enlarged abdominal or pelvic lymph nodes. Reproductive: Unremarkable. Other: No free intraperitoneal air.  No free fluid. Musculoskeletal: No acute fracture. 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 Review of the MIP images confirms the above findings. IMPRESSION: CTA CHEST: 1. Negative for acute pulmonary embolism. 2. Centrilobular micro nodularity and tree-in-bud  opacities in the right lower lobe, likely  infectious/inflammatory. 3. Similar smooth pleural thickening in the left thorax with associated small pleural effusion and pleural calcifications. Consolidation and bronchiolectasis in the left lower lung likely due to round atelectasis and fibrosis. This has slightly increased compared to 07/20/2023 and superimposed infection is not excluded. CT ABDOMEN AND PELVIS: Small bowel obstruction with abrupt transition point distal to a segment of fecalized small bowel in the low central abdomen. Aortic Atherosclerosis (ICD10-I70.0). Electronically Signed   By: Norman Gatlin M.D.   On: 08/19/2023 23:43   CT ABDOMEN PELVIS W CONTRAST Result Date: 08/19/2023 CLINICAL DATA:  Right lower quadrant abdominal pain with nausea and vomiting, epigastric pain, shortness of breath over the last 3 days and increasing weakness. Tachypnea. Poor p.o. intake. EXAM: CT ANGIOGRAPHY CHEST CT ABDOMEN AND PELVIS WITH CONTRAST TECHNIQUE: Multidetector CT imaging of the chest was performed using the standard protocol during bolus administration of intravenous contrast. Multiplanar CT image reconstructions and MIPs were obtained to evaluate the vascular anatomy. Multidetector CT imaging of the abdomen and pelvis was performed using the standard protocol during bolus administration of intravenous contrast. RADIATION DOSE REDUCTION: This exam was performed according to the departmental dose-optimization program which includes automated exposure control, adjustment of the mA and/or kV according to patient size and/or use of iterative reconstruction technique. CONTRAST:  OMNIPAQUE  IOHEXOL  350 MG/ML SOLN COMPARISON:  Same day chest radiograph; CT 02/03/2020; CT 07/20/2023 FINDINGS: CTA CHEST FINDINGS Cardiovascular: Negative for PE. No pericardial effusion. Coronary artery and aortic atherosclerotic calcification. No aortic dissection. Lipomatous hypertrophy of the intra-atrial septum. Mediastinum/Nodes: Trachea and esophagus are  unremarkable. Unchanged prominent mediastinal and hilar lymph nodes measuring up to 1.1 cm in the right hilum (series 3/image 137). Lungs/Pleura: Emphysema. Similar smooth pleural thickening in the left thorax with associated small pleural effusion and pleural calcifications. Consolidation and bronchiolectasis in the left lower lung likely due to round atelectasis and fibrosis. This has slightly increased compared to 07/20/2023 and superimposed infection is not excluded. Diffuse bronchial wall thickening. Centrilobular micro nodularity and tree-in-bud opacities in the right lower lobe. No pneumothorax. Musculoskeletal: No acute fracture. Review of the MIP images confirms the above findings. CT ABDOMEN and PELVIS FINDINGS Hepatobiliary: No acute abnormality. Pancreas: Unremarkable. Spleen: Unremarkable. Adrenals/Urinary Tract: Normal adrenal glands. No urinary calculi or hydronephrosis. Unremarkable bladder. Stomach/Bowel: Stomach is within normal limits. Dilation of the small bowel with abrupt transition point distal to a segment of fecalized small bowel in the low central abdomen (series 6/image 60). The small bowel downstream from the transition point is decompressed. Normal caliber colon without wall thickening. Vascular/Lymphatic: Aortic atherosclerosis. No enlarged abdominal or pelvic lymph nodes. Reproductive: Unremarkable. Other: No free intraperitoneal air.  No free fluid. Musculoskeletal: No acute fracture. 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 Review of the MIP images confirms the above findings. IMPRESSION: CTA CHEST: 1. Negative for acute pulmonary embolism. 2. Centrilobular micro nodularity and tree-in-bud opacities in the right lower lobe, likely infectious/inflammatory. 3. Similar smooth pleural thickening in the left thorax with associated small pleural effusion and pleural calcifications. Consolidation and bronchiolectasis in the left lower lung likely due to round atelectasis and  fibrosis. This has slightly increased compared to 07/20/2023 and superimposed infection is not excluded. CT ABDOMEN AND PELVIS: Small bowel obstruction with abrupt transition point distal to a segment of fecalized small bowel in the low central abdomen. Aortic Atherosclerosis (ICD10-I70.0). Electronically Signed   By: Norman Gatlin M.D.   On: 08/19/2023 23:43  DG Chest Port 1 View Result Date: 08/19/2023 CLINICAL DATA:  Shortness of breath.  Weakness. EXAM: PORTABLE CHEST 1 VIEW COMPARISON:  Chest radiograph 11/27/2022.  CT 07/20/2023 FINDINGS: Again seen volume loss in the left hemithorax. Chronic left pleural thickening and opacity at the lung base typical of round atelectasis. Chronic left apical opacity. The heart is normal in size. Mild bronchial thickening. No pneumothorax. No pulmonary edema. IMPRESSION: 1. Chronic left lung volume loss with pleural thickening and opacity at the lung base, round atelectasis on prior CT. 2. Mild bronchial thickening. Electronically Signed   By: Andrea Gasman M.D.   On: 08/19/2023 21:49    Anti-infectives: Anti-infectives (From admission, onward)    Start     Dose/Rate Route Frequency Ordered Stop   08/21/23 1015  Ampicillin -Sulbactam (UNASYN ) 3 g in sodium chloride  0.9 % 100 mL IVPB        3 g 200 mL/hr over 30 Minutes Intravenous Every 8 hours 08/21/23 0919 08/24/23 0214   08/20/23 2200  azithromycin  (ZITHROMAX ) 500 mg in sodium chloride  0.9 % 250 mL IVPB  Status:  Discontinued        500 mg 250 mL/hr over 60 Minutes Intravenous Every 24 hours 08/20/23 1436 08/21/23 0919   08/20/23 2200  cefTRIAXone  (ROCEPHIN ) 2 g in sodium chloride  0.9 % 100 mL IVPB  Status:  Discontinued        2 g 200 mL/hr over 30 Minutes Intravenous Every 24 hours 08/20/23 1436 08/21/23 0919   08/20/23 0015  cefTRIAXone  (ROCEPHIN ) 2 g in sodium chloride  0.9 % 100 mL IVPB        2 g 200 mL/hr over 30 Minutes Intravenous Once 08/20/23 0001 08/20/23 0120   08/20/23 0015   azithromycin  (ZITHROMAX ) 500 mg in sodium chloride  0.9 % 250 mL IVPB        500 mg 250 mL/hr over 60 Minutes Intravenous  Once 08/20/23 0001 08/20/23 0233       Assessment/Plan: SBO- passed protocol and had BM D/C NGT  Advance diet    LOS: 1 day    Debby A Fenix Rorke 08/21/2023 Moderate complexity

## 2023-08-21 NOTE — Progress Notes (Signed)
 PROGRESS NOTE                                                                                                                                                                                                             Patient Demographics:    Patrick Lam, is a 78 y.o. male, DOB - 1945/09/29, FMW:[id number]  Outpatient Primary MD for the patient is Alray Loader, MD    LOS - 1  Admit date - 08/19/2023    Chief Complaint  Patient presents with   Abdominal Pain   Emesis       Brief Narrative (HPI from H&P)   78 y.o. male with medical history significant of hypertension, hyperlipidemia, GERD, diabetes, narcolepsy with cataplexy, cataract, emphysema presenting with abdominal pain, nausea, vomiting.   Patient reports that 2 days ago his [facility] went out at his home.  He tried to stay there for a while but then he began to feel unwell and went to his daughter's house.  At his daughters out he had few episodes of nausea vomiting and abdominal discomfort along with cough and shortness of breath, came to the ER where he was diagnosed with SBO along with possible aspiration pneumonia and admitted to the hospital.     Subjective:    Patrick Lam today has, No headache, No chest pain, No abdominal pain - No Nausea, No new weakness tingling or numbness, mild cough, currently no shortness of breath, passing flatus and had a BM.   Assessment  & Plan :   SBO > Patient with history of previous hernia repair surgery, likely due to adhesions, being treated conservatively with bowel rest, NG tube and IV fluids, clinically improved, passing flatus and had a BM, will defer further management to general surgery.   Pneumonia with acute hypoxic respiratory failure > Suspicious for aspiration pneumonia after episodes of nausea vomiting, Unasyn  and monitor.  Has developed some hypoxia for which she is getting 2 L supplemental oxygen, encouraged to sit  in chair use flutter and I-S.  Monitor clinically.   Hypertension - As needed IV hydralazine    Hyperlipidemia - Hold home statin rosuvastatin  while n.p.o.   GERD - PPI IV   Diabetes - SSI  CBG (last 3)  Recent Labs    08/20/23 2327 08/21/23 0337 08/21/23 0801  GLUCAP 121* 133* 96  Narcolepsy with cataplexy - Holding home clomipramine , Ritalin , sodium oxybate  while n.p.o. - Patient does note that he has had trouble with sleepwalking when he is not taking his medication, notified nursing staff of this history so that night shift can be aware and watch for it.   Cataract Emphysema - Supportive care          Condition - Fair  Family Communication  : None present  Code Status :  Full  Consults  :   CCS  PUD Prophylaxis : PPI   Procedures  :     CT chest abdomen pelvis.    CTA CHEST: 1. Negative for acute pulmonary embolism. 2. Centrilobular micro nodularity and tree-in-bud opacities in the right lower lobe, likely infectious/inflammatory. 3. Similar smooth pleural thickening in the left thorax with associated small pleural effusion and pleural calcifications. Consolidation and bronchiolectasis in the left lower lung likely due to round atelectasis and fibrosis. This has slightly increased compared to 07/20/2023 and superimposed infection is not excluded.   CT ABDOMEN AND PELVIS: Small bowel obstruction with abrupt transition point distal to a segment of fecalized small bowel in the low central abdomen. Aortic Atherosclerosis (ICD10-I70.0).      Disposition Plan  :    Status is: Inpatient   DVT Prophylaxis  :    SCDs Start: 08/20/23 1433, heparin  added   Lab Results  Component Value Date   PLT 281 08/21/2023    Diet :  Diet Order             Diet NPO time specified  Diet effective now                    Inpatient Medications  Scheduled Meds:  insulin  aspart  0-6 Units Subcutaneous Q4H   sodium chloride  flush  3 mL Intravenous Q12H    Continuous Infusions:  ampicillin -sulbactam (UNASYN ) IV     lactated ringers  Stopped (08/21/23 0853)   PRN Meds:.acetaminophen  **OR** acetaminophen , HYDROmorphone  (DILAUDID ) injection, ondansetron  **OR** ondansetron  (ZOFRAN ) IV    Objective:   Vitals:   08/20/23 1321 08/20/23 1941 08/20/23 2000 08/21/23 0827  BP: (!) 154/88  126/72 110/80  Pulse: (!) 104  92 95  Resp: (!) 21  (!) 24 19  Temp: 98.7 F (37.1 C) 98.2 F (36.8 C)  (!) 97.1 F (36.2 C)  TempSrc: Oral Axillary  Oral  SpO2: 94%  90% (!) 88%  Weight:      Height:        Wt Readings from Last 3 Encounters:  08/19/23 92.1 kg  01/25/20 81.6 kg  04/14/18 89.4 kg     Intake/Output Summary (Last 24 hours) at 08/21/2023 0924 Last data filed at 08/20/2023 1812 Gross per 24 hour  Intake 60 ml  Output 850 ml  Net -790 ml     Physical Exam  Awake Alert, No new F.N deficits, Normal affect [place].AT,PERRAL Supple Neck, No JVD,   Symmetrical Chest wall movement, Good air movement bilaterally, CTAB RRR,No Gallops,Rubs or new Murmurs,  +ve B.Sounds, Abd Soft, No tenderness,   No Cyanosis, Clubbing or edema       Data Review:    Recent Labs  Lab 08/19/23 2112 08/20/23 1507 08/21/23 0459  WBC 11.1* 6.3 5.8  HGB 17.0 14.1 12.3*  HCT 52.4* 44.6 39.9  PLT 408* 327 281  MCV 72.0* 72.6* 74.6*  MCH 23.4* 23.0* 23.0*  MCHC 32.4 31.6 30.8  RDW 18.4* 17.0* 16.0*    Recent Labs  Lab 08/19/23 2112 08/20/23 0048 08/20/23 1507 08/21/23 0459  NA 138  --  140 141  K 4.2  --  4.2 3.6  CL 93*  --  98 102  CO2 24  --  30 34*  ANIONGAP 21*  --  12 5  GLUCOSE 192*  --  146* 106*  BUN 18  --  15 16  CREATININE 1.19  --  1.00 0.90  AST 31  --  22 21  ALT 15  --  15 15  ALKPHOS 65  --  37* 29*  BILITOT 0.6  --  0.4 <0.2  ALBUMIN 4.1  --  2.6* 2.2*  LATICACIDVEN  --  1.3  --   --   CALCIUM  11.3*  --  9.2 8.5*      Recent Labs  Lab 08/19/23 2112 08/20/23 0048 08/20/23 1507 08/21/23 0459  LATICACIDVEN  --   1.3  --   --   CALCIUM  11.3*  --  9.2 8.5*    --------------------------------------------------------------------------------------------------------------- Lab Results  Component Value Date   TRIG 126 02/13/2020    Lab Results  Component Value Date   HGBA1C 5.8 (H) 01/30/2020     Micro Results Recent Results (from the past 240 hours)  Blood culture (routine x 2)     Status: None (Preliminary result)   Collection Time: 08/19/23  9:10 PM   Specimen: BLOOD  Result Value Ref Range Status   Specimen Description   Final    BLOOD LEFT ANTECUBITAL Performed at [facility], [address] [place], [place] [address]    Special Requests   Final    BOTTLES DRAWN AEROBIC AND ANAEROBIC Blood Culture adequate volume Performed at [facility], [address] [place], [place] [address]    Culture   Final    NO GROWTH < 24 HOURS Performed at [facility], [address] [place], [place] [address]    Report Status PENDING  Incomplete  Resp panel by RT-PCR (RSV, Flu A&B, Covid) Anterior Nasal Swab     Status: None   Collection Time: 08/20/23 12:04 AM   Specimen: Anterior Nasal Swab  Result Value Ref Range Status   SARS Coronavirus 2 by RT PCR NEGATIVE NEGATIVE Final    Comment: (NOTE) SARS-CoV-2 target nucleic acids are NOT DETECTED.  The SARS-CoV-2 RNA is generally detectable in upper respiratory specimens during the acute phase of infection. The lowest concentration of SARS-CoV-2 viral copies this assay can detect is 138 copies/mL. A negative result does not preclude SARS-Cov-2 infection and should not be used as the sole basis for treatment or other patient management decisions. A negative result may occur with  improper specimen collection/handling, submission of specimen other than nasopharyngeal swab, presence of viral mutation(s) within the areas targeted by this assay, and inadequate number of viral copies(<138 copies/mL). A  negative result must be combined with clinical observations, patient history, and epidemiological information. The expected result is Negative.  Fact Sheet for Patients:  BloggerCourse.com  Fact Sheet for Healthcare Providers:  SeriousBroker.it  This test is no t yet approved or cleared by the [place]  FDA and  has been authorized for detection and/or diagnosis of SARS-CoV-2 by FDA under an Emergency Use Authorization (EUA). This EUA will remain  in effect (meaning this test can be used) for the duration of the COVID-19 declaration under Section 564(b)(1) of the Act, 21 U.S.C.section 360bbb-3(b)(1), unless the authorization is terminated  or revoked sooner.  Influenza A by PCR NEGATIVE NEGATIVE Final   Influenza B by PCR NEGATIVE NEGATIVE Final    Comment: (NOTE) The Xpert Xpress SARS-CoV-2/FLU/RSV plus assay is intended as an aid in the diagnosis of influenza from Nasopharyngeal swab specimens and should not be used as a sole basis for treatment. Nasal washings and aspirates are unacceptable for Xpert Xpress SARS-CoV-2/FLU/RSV testing.  Fact Sheet for Patients: BloggerCourse.com  Fact Sheet for Healthcare Providers: SeriousBroker.it  This test is not yet approved or cleared by the [place]  FDA and has been authorized for detection and/or diagnosis of SARS-CoV-2 by FDA under an Emergency Use Authorization (EUA). This EUA will remain in effect (meaning this test can be used) for the duration of the COVID-19 declaration under Section 564(b)(1) of the Act, 21 U.S.C. section 360bbb-3(b)(1), unless the authorization is terminated or revoked.     Resp Syncytial Virus by PCR NEGATIVE NEGATIVE Final    Comment: (NOTE) Fact Sheet for Patients: BloggerCourse.com  Fact Sheet for Healthcare  Providers: SeriousBroker.it  This test is not yet approved or cleared by the [place]  FDA and has been authorized for detection and/or diagnosis of SARS-CoV-2 by FDA under an Emergency Use Authorization (EUA). This EUA will remain in effect (meaning this test can be used) for the duration of the COVID-19 declaration under Section 564(b)(1) of the Act, 21 U.S.C. section 360bbb-3(b)(1), unless the authorization is terminated or revoked.  Performed at [facility], [address], [place], [place] [address]   Blood culture (routine x 2)     Status: None (Preliminary result)   Collection Time: 08/20/23 12:48 AM   Specimen: BLOOD RIGHT FOREARM  Result Value Ref Range Status   Specimen Description   Final    BLOOD RIGHT FOREARM Performed at [facility], [address] [place], [place] [address]    Special Requests   Final    BOTTLES DRAWN AEROBIC AND ANAEROBIC Blood Culture adequate volume Performed at [facility], [address], [place], [place] [address]    Culture   Final    NO GROWTH < 24 HOURS Performed at [facility], [address] [place], [place] [address]    Report Status PENDING  Incomplete    Radiology Report DG Chest Port 1 View Result Date: 08/21/2023 CLINICAL DATA:  : 78 year old male with possible small obstruction. Shortness of breath. EXAM: PORTABLE CHEST 1 VIEW COMPARISON:  CTA chest 08/19/2023 and earlier. FINDINGS: Portable AP semi upright view at 0604 hours. Enteric tube terminating in the epigastrium as seen on abdominal radiographs today. Stable lung volumes and mediastinal contours. Left lung base fibrothorax. No superimposed pneumothorax, pulmonary edema, or new pulmonary opacity. Stable visualized osseous structures. IMPRESSION: 1. Left lung base fibrothorax. No new cardiopulmonary abnormality identified. 2. Enteric tube terminating in the epigastrium.  Electronically Signed   By: VEAR Hurst M.D.   On: 08/21/2023 07:05   DG Abd Portable 1V Result Date: 08/21/2023 CLINICAL DATA:  78 year old male with possible small bowel obstruction. EXAM: PORTABLE ABDOMEN - 1 VIEW COMPARISON:  8 hour delayed images 0149 hours today and earlier. FINDINGS: Portable AP supine view at 0606 hours. Oral contrast throughout the large bowel to the rectum. Excreted IV contrast in the bladder. Improved but unresolved mid abdominal small bowel mildly gas distended loop with wall thickening. Stable enteric tube terminating in the stomach. Stable visualized osseous structures. IMPRESSION: 1. Oral contrast throughout the large bowel to the rectum. 2. Improved but not completely normalized mid abdominal  small bowel appearance since the CT on 08/19/2023. 3. Stable enteric tube. Electronically Signed   By: VEAR Hurst M.D.   On: 08/21/2023 07:02   DG Abd Portable 1V-Small Bowel Obstruction Protocol-initial, 8 hr delay Result Date: 08/21/2023 CLINICAL DATA:  78 year old male with possible small bowel obstruction, 8 hour delayed post oral contrast images. EXAM: PORTABLE ABDOMEN - 1 VIEW COMPARISON:  08/20/2023 radiographs, CT Abdomen and Pelvis 08/19/2023. FINDINGS: Three supine views at 0149 hours. Stable enteric tube. No significant residual oral contrast in the stomach. And oral contrast now present throughout the large bowel to the sigmoid, possibly the rectum. Superimposed excreted IV contrast in the urinary bladder in the central pelvis. Small bowel gas pattern also mildly improved compared to the CT on 08/19/2023. Left lung base fibrothorax redemonstrated. Degenerative changes in the spine. IMPRESSION: 1. Oral contrast now present throughout the large bowel to the distal colon. Small bowel-gas pattern also appears improved from the scout view on 08/19/2023. 2. Stable enteric tube. Excreted IV contrast in the urinary bladder. 3. Left lung base fibrothorax. Electronically Signed   By: VEAR Hurst M.D.    On: 08/21/2023 06:03   DG Abd Portable 1V Result Date: 08/20/2023 CLINICAL DATA:  Nasogastric tube placement. NG tube tape is a different measurement than initially recorded, concern for shift. EXAM: PORTABLE ABDOMEN - 1 VIEW COMPARISON:  Radiograph earlier today FINDINGS: Tip of the enteric tube in the stomach. There is enteric contrast in the stomach which obscures assessment of the side-port. Enteric contrast is also seen within dilated bowel in the upper abdomen. IMPRESSION: Tip of the enteric tube in the stomach. The side-port is obscured by enteric contrast. Electronically Signed   By: Andrea Gasman M.D.   On: 08/20/2023 18:43   DG Abdomen 1 View Result Date: 08/20/2023 CLINICAL DATA:  NG tube placement EXAM: ABDOMEN - 1 VIEW COMPARISON:  CT abdomen pelvis 08/19/2023 FINDINGS: Enteric tube tip and side-port in the stomach. Similar dilated loops of small bowel in the upper abdomen. IMPRESSION: Enteric tube tip and side-port in the stomach. Electronically Signed   By: Norman Gatlin M.D.   On: 08/20/2023 03:42   CT Angio Chest PE W and/or Wo Contrast Result Date: 08/19/2023 CLINICAL DATA:  Right lower quadrant abdominal pain with nausea and vomiting, epigastric pain, shortness of breath over the last 3 days and increasing weakness. Tachypnea. Poor p.o. intake. EXAM: CT ANGIOGRAPHY CHEST CT ABDOMEN AND PELVIS WITH CONTRAST TECHNIQUE: Multidetector CT imaging of the chest was performed using the standard protocol during bolus administration of intravenous contrast. Multiplanar CT image reconstructions and MIPs were obtained to evaluate the vascular anatomy. Multidetector CT imaging of the abdomen and pelvis was performed using the standard protocol during bolus administration of intravenous contrast. RADIATION DOSE REDUCTION: This exam was performed according to the departmental dose-optimization program which includes automated exposure control, adjustment of the mA and/or kV according to patient size  and/or use of iterative reconstruction technique. CONTRAST:  OMNIPAQUE  IOHEXOL  350 MG/ML SOLN COMPARISON:  Same day chest radiograph; CT 02/03/2020; CT 07/20/2023 FINDINGS: CTA CHEST FINDINGS Cardiovascular: Negative for PE. No pericardial effusion. Coronary artery and aortic atherosclerotic calcification. No aortic dissection. Lipomatous hypertrophy of the intra-atrial septum. Mediastinum/Nodes: Trachea and esophagus are unremarkable. Unchanged prominent mediastinal and hilar lymph nodes measuring up to 1.1 cm in the right hilum (series 3/image 137). Lungs/Pleura: Emphysema. Similar smooth pleural thickening in the left thorax with associated small pleural effusion and pleural calcifications. Consolidation and bronchiolectasis in the  left lower lung likely due to round atelectasis and fibrosis. This has slightly increased compared to 07/20/2023 and superimposed infection is not excluded. Diffuse bronchial wall thickening. Centrilobular micro nodularity and tree-in-bud opacities in the right lower lobe. No pneumothorax. Musculoskeletal: No acute fracture. Review of the MIP images confirms the above findings. CT ABDOMEN and PELVIS FINDINGS Hepatobiliary: No acute abnormality. Pancreas: Unremarkable. Spleen: Unremarkable. Adrenals/Urinary Tract: Normal adrenal glands. No urinary calculi or hydronephrosis. Unremarkable bladder. Stomach/Bowel: Stomach is within normal limits. Dilation of the small bowel with abrupt transition point distal to a segment of fecalized small bowel in the low central abdomen (series 6/image 60). The small bowel downstream from the transition point is decompressed. Normal caliber colon without wall thickening. Vascular/Lymphatic: Aortic atherosclerosis. No enlarged abdominal or pelvic lymph nodes. Reproductive: Unremarkable. Other: No free intraperitoneal air.  No free fluid. Musculoskeletal: No acute fracture. 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 Review of the MIP  images confirms the above findings. IMPRESSION: CTA CHEST: 1. Negative for acute pulmonary embolism. 2. Centrilobular micro nodularity and tree-in-bud opacities in the right lower lobe, likely infectious/inflammatory. 3. Similar smooth pleural thickening in the left thorax with associated small pleural effusion and pleural calcifications. Consolidation and bronchiolectasis in the left lower lung likely due to round atelectasis and fibrosis. This has slightly increased compared to 07/20/2023 and superimposed infection is not excluded. CT ABDOMEN AND PELVIS: Small bowel obstruction with abrupt transition point distal to a segment of fecalized small bowel in the low central abdomen. Aortic Atherosclerosis (ICD10-I70.0). Electronically Signed   By: Norman Gatlin M.D.   On: 08/19/2023 23:43   CT ABDOMEN PELVIS W CONTRAST Result Date: 08/19/2023 CLINICAL DATA:  Right lower quadrant abdominal pain with nausea and vomiting, epigastric pain, shortness of breath over the last 3 days and increasing weakness. Tachypnea. Poor p.o. intake. EXAM: CT ANGIOGRAPHY CHEST CT ABDOMEN AND PELVIS WITH CONTRAST TECHNIQUE: Multidetector CT imaging of the chest was performed using the standard protocol during bolus administration of intravenous contrast. Multiplanar CT image reconstructions and MIPs were obtained to evaluate the vascular anatomy. Multidetector CT imaging of the abdomen and pelvis was performed using the standard protocol during bolus administration of intravenous contrast. RADIATION DOSE REDUCTION: This exam was performed according to the departmental dose-optimization program which includes automated exposure control, adjustment of the mA and/or kV according to patient size and/or use of iterative reconstruction technique. CONTRAST:  OMNIPAQUE  IOHEXOL  350 MG/ML SOLN COMPARISON:  Same day chest radiograph; CT 02/03/2020; CT 07/20/2023 FINDINGS: CTA CHEST FINDINGS Cardiovascular: Negative for PE. No pericardial  effusion. Coronary artery and aortic atherosclerotic calcification. No aortic dissection. Lipomatous hypertrophy of the intra-atrial septum. Mediastinum/Nodes: Trachea and esophagus are unremarkable. Unchanged prominent mediastinal and hilar lymph nodes measuring up to 1.1 cm in the right hilum (series 3/image 137). Lungs/Pleura: Emphysema. Similar smooth pleural thickening in the left thorax with associated small pleural effusion and pleural calcifications. Consolidation and bronchiolectasis in the left lower lung likely due to round atelectasis and fibrosis. This has slightly increased compared to 07/20/2023 and superimposed infection is not excluded. Diffuse bronchial wall thickening. Centrilobular micro nodularity and tree-in-bud opacities in the right lower lobe. No pneumothorax. Musculoskeletal: No acute fracture. Review of the MIP images confirms the above findings. CT ABDOMEN and PELVIS FINDINGS Hepatobiliary: No acute abnormality. Pancreas: Unremarkable. Spleen: Unremarkable. Adrenals/Urinary Tract: Normal adrenal glands. No urinary calculi or hydronephrosis. Unremarkable bladder. Stomach/Bowel: Stomach is within normal limits. Dilation of the small bowel with abrupt transition point distal to  a segment of fecalized small bowel in the low central abdomen (series 6/image 60). The small bowel downstream from the transition point is decompressed. Normal caliber colon without wall thickening. Vascular/Lymphatic: Aortic atherosclerosis. No enlarged abdominal or pelvic lymph nodes. Reproductive: Unremarkable. Other: No free intraperitoneal air.  No free fluid. Musculoskeletal: No acute fracture. 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 Review of the MIP images confirms the above findings. IMPRESSION: CTA CHEST: 1. Negative for acute pulmonary embolism. 2. Centrilobular micro nodularity and tree-in-bud opacities in the right lower lobe, likely infectious/inflammatory. 3. Similar smooth pleural  thickening in the left thorax with associated small pleural effusion and pleural calcifications. Consolidation and bronchiolectasis in the left lower lung likely due to round atelectasis and fibrosis. This has slightly increased compared to 07/20/2023 and superimposed infection is not excluded. CT ABDOMEN AND PELVIS: Small bowel obstruction with abrupt transition point distal to a segment of fecalized small bowel in the low central abdomen. Aortic Atherosclerosis (ICD10-I70.0). Electronically Signed   By: Norman Gatlin M.D.   On: 08/19/2023 23:43   DG Chest Port 1 View Result Date: 08/19/2023 CLINICAL DATA:  Shortness of breath.  Weakness. EXAM: PORTABLE CHEST 1 VIEW COMPARISON:  Chest radiograph 11/27/2022.  CT 07/20/2023 FINDINGS: Again seen volume loss in the left hemithorax. Chronic left pleural thickening and opacity at the lung base typical of round atelectasis. Chronic left apical opacity. The heart is normal in size. Mild bronchial thickening. No pneumothorax. No pulmonary edema. IMPRESSION: 1. Chronic left lung volume loss with pleural thickening and opacity at the lung base, round atelectasis on prior CT. 2. Mild bronchial thickening. Electronically Signed   By: Andrea Gasman M.D.   On: 08/19/2023 21:49     Signature  -   Lavada Stank M.D on 08/21/2023 at 9:24 AM   -  To page go to [email or web address]

## 2023-08-21 NOTE — Plan of Care (Signed)

## 2023-08-21 NOTE — Evaluation (Signed)
 Physical Therapy Evaluation Patient Details Name: Patrick Lam MRN: [id number] DOB: 1945-04-20 Today's Date: 08/21/2023  History of Present Illness  Patrick Lam is a 78 y.o. male presenting with abdominal pain, nausea, vomiting. Imaging workup included chest x-ray which showed chronic left lower lobe volume loss with pleural thickening and opacity.  CTA PE study showed no evidence of PE but did show tree-in-bud opacities of the right lower lobe suspicious for infectious etiology and similar stable changes of the left lower lobe.  CT of the abdomen pelvis showed evidence of SBO with transition point. Past medical history significant of hypertension, hyperlipidemia, GERD, diabetes, narcolepsy with cataplexy, cataract, emphysema.  Clinical Impression  Pt presents with admitting diagnosis above. Pt today was able to ambulate in the hallway with supervision no AD. Pt received at 97% on 4L. Pt placed on RA and dropped to 93% prior to ambulation. Pt desat to 78% on RA during ambulation with frequent cues for PLB. Pt placed back on 3L and recovered quickly to 93%. PTA pt was fully independent. Pt appears to be very close to baseline for mobility and anticipate no post acute PT needs however PT will continue to follow acutely. Pt would benefit from continued mobility with mobility specialist during acute stay.         If plan is discharge home, recommend the following: Assist for transportation;Help with stairs or ramp for entrance   Can travel by private vehicle        Equipment Recommendations None recommended by PT  Recommendations for Other Services       Functional Status Assessment Patient has had a recent decline in their functional status and demonstrates the ability to make significant improvements in function in a reasonable and predictable amount of time.     Precautions / Restrictions Precautions Precautions: Fall Recall of Precautions/Restrictions: Intact Restrictions Weight Bearing  Restrictions Per Provider Order: No      Mobility  Bed Mobility Overal bed mobility: Modified Independent                  Transfers Overall transfer level: Independent Equipment used: None                    Ambulation/Gait Ambulation/Gait assistance: Supervision Gait Distance (Feet): 75 Feet Assistive device: None Gait Pattern/deviations: WFL(Within Functional Limits) Gait velocity: decreased     General Gait Details: no LOB noted. Slowed step through pattern however likely at baseline. Trialed RA however SpO2 dropped to 78%.  Stairs            Wheelchair Mobility     Tilt Bed    Modified Rankin (Stroke Patients Only)       Balance Overall balance assessment: No apparent balance deficits (not formally assessed)                                           Pertinent Vitals/Pain Pain Assessment Pain Assessment: No/denies pain    Home Living Family/patient expects to be discharged to:: Private residence Living Arrangements: Alone Available Help at Discharge: Family;Friend(s);Neighbor;Available PRN/intermittently Type of Home: House Home Access: Level entry       Home Layout: One level Home Equipment: None      Prior Function Prior Level of Function : Independent/Modified Independent;Driving             Mobility Comments: Ind no AD ADLs Comments:  Ind     Extremity/Trunk Assessment   Upper Extremity Assessment Upper Extremity Assessment: Overall WFL for tasks assessed    Lower Extremity Assessment Lower Extremity Assessment: Overall WFL for tasks assessed    Cervical / Trunk Assessment Cervical / Trunk Assessment: Normal  Communication   Communication Communication: No apparent difficulties    Cognition Arousal: Alert Behavior During Therapy: WFL for tasks assessed/performed   PT - Cognitive impairments: No apparent impairments                       PT - Cognition Comments: Very  pleasant. Following commands: Intact       Cueing Cueing Techniques: Verbal cues, Tactile cues     General Comments General comments (skin integrity, edema, etc.): Pt received at 97% on 4L. Pt placed on RA and dropped to 93% prior to ambulation. Pt desat to 78% on RA during ambulation with frequent cues for PLB. Pt placed back on 3L and recovered quickly to 93%.    Exercises     Assessment/Plan    PT Assessment Patient needs continued PT services  PT Problem List Decreased strength;Decreased range of motion;Decreased activity tolerance;Decreased balance;Decreased mobility;Decreased coordination;Decreased knowledge of use of DME;Decreased safety awareness;Decreased knowledge of precautions;Cardiopulmonary status limiting activity       PT Treatment Interventions DME instruction;Gait training;Stair training;Functional mobility training;Therapeutic activities;Therapeutic exercise;Balance training;Neuromuscular re-education;Patient/family education    PT Goals (Current goals can be found in the Care Plan section)  Acute Rehab PT Goals Patient Stated Goal: to go home PT Goal Formulation: With patient Time For Goal Achievement: 09/04/23 Potential to Achieve Goals: Good    Frequency Min 2X/week     Co-evaluation               AM-PAC PT 6 Clicks Mobility  Outcome Measure Help needed turning from your back to your side while in a flat bed without using bedrails?: None Help needed moving from lying on your back to sitting on the side of a flat bed without using bedrails?: None Help needed moving to and from a bed to a chair (including a wheelchair)?: None Help needed standing up from a chair using your arms (e.g., wheelchair or bedside chair)?: None Help needed to walk in hospital room?: A Little Help needed climbing 3-5 steps with a railing? : A Little 6 Click Score: 22    End of Session Equipment Utilized During Treatment: Gait belt;Oxygen Activity Tolerance: Patient  tolerated treatment well Patient left: in bed;with call bell/phone within reach Nurse Communication: Mobility status PT Visit Diagnosis: Other abnormalities of gait and mobility (R26.89)    Time: 0802-0829 PT Time Calculation (min) (ACUTE ONLY): 27 min   Charges:   PT Evaluation $PT Eval Moderate Complexity: 1 Mod PT Treatments $Gait Training: 8-22 mins PT General Charges $$ ACUTE PT VISIT: 1 Visit         Sueellen NOVAK, PT, DPT Acute Rehab Services [phone]   Karimah Winquist 08/21/2023, 8:59 AM

## 2023-08-22 DIAGNOSIS — K56609 Unspecified intestinal obstruction, unspecified as to partial versus complete obstruction: Secondary | ICD-10-CM | POA: Diagnosis not present

## 2023-08-22 LAB — CBC WITH DIFFERENTIAL/PLATELET
Abs Immature Granulocytes: 0.05 K/uL (ref 0.00–0.07)
Basophils Absolute: 0 K/uL (ref 0.0–0.1)
Basophils Relative: 0 %
Eosinophils Absolute: 0.4 K/uL (ref 0.0–0.5)
Eosinophils Relative: 7 %
HCT: 37.8 % — ABNORMAL LOW (ref 39.0–52.0)
Hemoglobin: 12.3 g/dL — ABNORMAL LOW (ref 13.0–17.0)
Immature Granulocytes: 1 %
Lymphocytes Relative: 26 %
Lymphs Abs: 1.5 K/uL (ref 0.7–4.0)
MCH: 24.1 pg — ABNORMAL LOW (ref 26.0–34.0)
MCHC: 32.5 g/dL (ref 30.0–36.0)
MCV: 74 fL — ABNORMAL LOW (ref 80.0–100.0)
Monocytes Absolute: 0.7 K/uL (ref 0.1–1.0)
Monocytes Relative: 12 %
Neutro Abs: 3.2 K/uL (ref 1.7–7.7)
Neutrophils Relative %: 54 %
Platelets: 251 K/uL (ref 150–400)
RBC: 5.11 MIL/uL (ref 4.22–5.81)
RDW: 15.9 % — ABNORMAL HIGH (ref 11.5–15.5)
WBC: 5.8 K/uL (ref 4.0–10.5)
nRBC: 0 % (ref 0.0–0.2)

## 2023-08-22 LAB — COMPREHENSIVE METABOLIC PANEL WITH GFR
ALT: 21 U/L (ref 0–44)
AST: 29 U/L (ref 15–41)
Albumin: 2.2 g/dL — ABNORMAL LOW (ref 3.5–5.0)
Alkaline Phosphatase: 28 U/L — ABNORMAL LOW (ref 38–126)
Anion gap: 6 (ref 5–15)
BUN: 9 mg/dL (ref 8–23)
CO2: 30 mmol/L (ref 22–32)
Calcium: 8.1 mg/dL — ABNORMAL LOW (ref 8.9–10.3)
Chloride: 99 mmol/L (ref 98–111)
Creatinine, Ser: 0.71 mg/dL (ref 0.61–1.24)
GFR, Estimated: >60 mL/min (ref >60)
Glucose, Bld: 110 mg/dL — ABNORMAL HIGH (ref 70–99)
Potassium: 3.4 mmol/L — ABNORMAL LOW (ref 3.5–5.1)
Sodium: 135 mmol/L (ref 135–145)
Total Bilirubin: 0.4 mg/dL (ref 0.0–1.2)
Total Protein: 5.3 g/dL — ABNORMAL LOW (ref 6.5–8.1)

## 2023-08-22 LAB — GLUCOSE, CAPILLARY
Glucose-Capillary: 124 mg/dL — ABNORMAL HIGH (ref 70–99)
Glucose-Capillary: 125 mg/dL — ABNORMAL HIGH (ref 70–99)
Glucose-Capillary: 120 mg/dL — ABNORMAL HIGH (ref 70–99)
Glucose-Capillary: 130 mg/dL — ABNORMAL HIGH (ref 70–99)
Glucose-Capillary: 152 mg/dL — ABNORMAL HIGH (ref 70–99)
Glucose-Capillary: 99 mg/dL (ref 70–99)

## 2023-08-22 LAB — MAGNESIUM: Magnesium: 1.7 mg/dL (ref 1.7–2.4)

## 2023-08-22 LAB — PHOSPHORUS: Phosphorus: 2 mg/dL — ABNORMAL LOW (ref 2.5–4.6)

## 2023-08-22 MED ORDER — DILTIAZEM HCL ER COATED BEADS 120 MG PO CP24
240.0000 mg | ORAL_CAPSULE | Freq: Every day | ORAL | Status: DC
  Administered 2023-08-22: 240 mg via ORAL
  Filled 2023-08-22: qty 2

## 2023-08-22 MED ORDER — METHYLPHENIDATE HCL 5 MG PO TABS: 20.0000 mg | ORAL_TABLET | Freq: Every day | ORAL | Status: DC | PRN

## 2023-08-22 MED ORDER — POTASSIUM PHOSPHATES 15 MMOLE/5ML IV SOLN
30.0000 mmol | Freq: Once | INTRAVENOUS | Status: AC
  Administered 2023-08-22: 30 mmol via INTRAVENOUS
  Filled 2023-08-22: qty 10

## 2023-08-22 MED ORDER — ROSUVASTATIN CALCIUM 5 MG PO TABS
10.0000 mg | ORAL_TABLET | Freq: Every day | ORAL | Status: DC
  Administered 2023-08-22 – 2023-08-23 (×2): 10 mg via ORAL
  Filled 2023-08-22 (×2): qty 2

## 2023-08-22 MED ORDER — CYANOCOBALAMIN 500 MCG PO TABS
500.0000 ug | ORAL_TABLET | Freq: Every day | ORAL | Status: DC
  Administered 2023-08-22 – 2023-08-23 (×2): 500 ug via ORAL
  Filled 2023-08-22 (×2): qty 1

## 2023-08-22 MED ORDER — PANTOPRAZOLE SODIUM 40 MG PO TBEC
40.0000 mg | DELAYED_RELEASE_TABLET | Freq: Every day | ORAL | Status: DC
  Administered 2023-08-23: 40 mg via ORAL
  Filled 2023-08-22 (×2): qty 1

## 2023-08-22 MED ORDER — SODIUM OXYBATE 500 MG/ML PO SOLN: 4500.0000 mg | Freq: Two times a day (BID) | ORAL | Status: DC

## 2023-08-22 MED ORDER — POTASSIUM CHLORIDE CRYS ER 20 MEQ PO TBCR
40.0000 meq | EXTENDED_RELEASE_TABLET | Freq: Once | ORAL | Status: AC
  Administered 2023-08-22: 40 meq via ORAL
  Filled 2023-08-22: qty 2

## 2023-08-22 MED ORDER — CLOMIPRAMINE HCL 25 MG PO CAPS
25.0000 mg | ORAL_CAPSULE | Freq: Three times a day (TID) | ORAL | Status: DC
  Administered 2023-08-22 – 2023-08-23 (×3): 25 mg via ORAL
  Filled 2023-08-22 (×3): qty 1

## 2023-08-22 NOTE — Progress Notes (Signed)
   08/22/23 1254  Mobility  Activity Ambulated independently in hallway  Level of Assistance Standby assist, set-up cues, supervision of patient - no hands on  Assistive Device None  Distance Ambulated (ft) 150 ft  Activity Response Tolerated fair  Mobility Referral Yes  Mobility visit 1 Mobility  Mobility Specialist Start Time (ACUTE ONLY) 1254  Mobility Specialist Stop Time (ACUTE ONLY) 1308  Mobility Specialist Time Calculation (min) (ACUTE ONLY) 14 min   Mobility Specialist: Progress Note  Pre-Mobility:      HR  86,     SpO2 96% 2L During Mobility:                   SpO2 94%  2L Post-Mobility:                      SpO2 88-91% 2L  Pt agreeable to mobility session - received in chair. C/o SOB post mobility, VSS with PLB. Returned to chair with all needs met - call bell within reach.   Virgle Boards, BS Mobility Specialist Please contact via SecureChat or  Rehab office at [phone].

## 2023-08-22 NOTE — Progress Notes (Signed)
 Subjective/Chief Complaint: Patient having bowel movements.  Some mild discomfort with eating but no nausea or vomiting.   Objective: Vital signs in last 24 hours: Temp:  [97.1 F (36.2 C)-97.9 F (36.6 C)] 97.8 F (36.6 C) (07/05 0325) Pulse Rate:  [73-90] 90 (07/05 0325) Resp:  [17-22] 19 (07/05 0325) BP: (102-152)/(46-73) 150/73 (07/05 0325) SpO2:  [91 %-98 %] 91 % (07/05 0325) Last BM Date : 08/21/23  Intake/Output from previous day: 07/04 0701 - 07/05 0700 In: 794.9 [P.O.:240; I.V.:454.9; IV Piggyback:100] Out: 100 [Emesis/NG output:100] Intake/Output this shift: No intake/output data recorded.  Abdomen: Soft nontender without rebound or guarding.  Minimal distention.  Lab Results:  Recent Labs    08/21/23 0459 08/22/23 0409  WBC 5.8 5.8  HGB 12.3* 12.3*  HCT 39.9 37.8*  PLT 281 251   BMET Recent Labs    08/21/23 0459 08/22/23 0409  NA 141 135  K 3.6 3.4*  CL 102 99  CO2 34* 30  GLUCOSE 106* 110*  BUN 16 9  CREATININE 0.90 0.71  CALCIUM  8.5* 8.1*   PT/INR No results for input(s): LABPROT, INR in the last 72 hours. ABG No results for input(s): PHART, HCO3 in the last 72 hours.  Invalid input(s): PCO2, PO2  Studies/Results: DG Chest Port 1 View Result Date: 08/21/2023 CLINICAL DATA:  : 78 year old male with possible small obstruction. Shortness of breath. EXAM: PORTABLE CHEST 1 VIEW COMPARISON:  CTA chest 08/19/2023 and earlier. FINDINGS: Portable AP semi upright view at 0604 hours. Enteric tube terminating in the epigastrium as seen on abdominal radiographs today. Stable lung volumes and mediastinal contours. Left lung base fibrothorax. No superimposed pneumothorax, pulmonary edema, or new pulmonary opacity. Stable visualized osseous structures. IMPRESSION: 1. Left lung base fibrothorax. No new cardiopulmonary abnormality identified. 2. Enteric tube terminating in the epigastrium. Electronically Signed   By: VEAR Hurst M.D.   On: 08/21/2023  07:05   DG Abd Portable 1V Result Date: 08/21/2023 CLINICAL DATA:  78 year old male with possible small bowel obstruction. EXAM: PORTABLE ABDOMEN - 1 VIEW COMPARISON:  8 hour delayed images 0149 hours today and earlier. FINDINGS: Portable AP supine view at 0606 hours. Oral contrast throughout the large bowel to the rectum. Excreted IV contrast in the bladder. Improved but unresolved mid abdominal small bowel mildly gas distended loop with wall thickening. Stable enteric tube terminating in the stomach. Stable visualized osseous structures. IMPRESSION: 1. Oral contrast throughout the large bowel to the rectum. 2. Improved but not completely normalized mid abdominal small bowel appearance since the CT on 08/19/2023. 3. Stable enteric tube. Electronically Signed   By: VEAR Hurst M.D.   On: 08/21/2023 07:02   DG Abd Portable 1V-Small Bowel Obstruction Protocol-initial, 8 hr delay Result Date: 08/21/2023 CLINICAL DATA:  78 year old male with possible small bowel obstruction, 8 hour delayed post oral contrast images. EXAM: PORTABLE ABDOMEN - 1 VIEW COMPARISON:  08/20/2023 radiographs, CT Abdomen and Pelvis 08/19/2023. FINDINGS: Three supine views at 0149 hours. Stable enteric tube. No significant residual oral contrast in the stomach. And oral contrast now present throughout the large bowel to the sigmoid, possibly the rectum. Superimposed excreted IV contrast in the urinary bladder in the central pelvis. Small bowel gas pattern also mildly improved compared to the CT on 08/19/2023. Left lung base fibrothorax redemonstrated. Degenerative changes in the spine. IMPRESSION: 1. Oral contrast now present throughout the large bowel to the distal colon. Small bowel-gas pattern also appears improved from the scout view on 08/19/2023. 2. Stable  enteric tube. Excreted IV contrast in the urinary bladder. 3. Left lung base fibrothorax. Electronically Signed   By: VEAR Hurst M.D.   On: 08/21/2023 06:03   DG Abd Portable 1V Result  Date: 08/20/2023 CLINICAL DATA:  Nasogastric tube placement. NG tube tape is a different measurement than initially recorded, concern for shift. EXAM: PORTABLE ABDOMEN - 1 VIEW COMPARISON:  Radiograph earlier today FINDINGS: Tip of the enteric tube in the stomach. There is enteric contrast in the stomach which obscures assessment of the side-port. Enteric contrast is also seen within dilated bowel in the upper abdomen. IMPRESSION: Tip of the enteric tube in the stomach. The side-port is obscured by enteric contrast. Electronically Signed   By: Andrea Gasman M.D.   On: 08/20/2023 18:43    Anti-infectives: Anti-infectives (From admission, onward)    Start     Dose/Rate Route Frequency Ordered Stop   08/21/23 1015  Ampicillin -Sulbactam (UNASYN ) 3 g in sodium chloride  0.9 % 100 mL IVPB        3 g 200 mL/hr over 30 Minutes Intravenous Every 8 hours 08/21/23 0919 08/24/23 0214   08/20/23 2200  azithromycin  (ZITHROMAX ) 500 mg in sodium chloride  0.9 % 250 mL IVPB  Status:  Discontinued        500 mg 250 mL/hr over 60 Minutes Intravenous Every 24 hours 08/20/23 1436 08/21/23 0919   08/20/23 2200  cefTRIAXone  (ROCEPHIN ) 2 g in sodium chloride  0.9 % 100 mL IVPB  Status:  Discontinued        2 g 200 mL/hr over 30 Minutes Intravenous Every 24 hours 08/20/23 1436 08/21/23 0919   08/20/23 0015  cefTRIAXone  (ROCEPHIN ) 2 g in sodium chloride  0.9 % 100 mL IVPB        2 g 200 mL/hr over 30 Minutes Intravenous Once 08/20/23 0001 08/20/23 0120   08/20/23 0015  azithromycin  (ZITHROMAX ) 500 mg in sodium chloride  0.9 % 250 mL IVPB        500 mg 250 mL/hr over 60 Minutes Intravenous  Once 08/20/23 0001 08/20/23 0233       Assessment/Plan: SBO-tolerating diet but some pain with eating.  Continue soft diet for today.  Patient states she will be discharged tomorrow which I think is reasonable if he tolerates his diet today with no increasing abdominal pain, nausea or vomiting.   LOS: 2 days    Debby LABOR  Maryella Abood 08/22/2023 Moderate complexity

## 2023-08-22 NOTE — Plan of Care (Signed)
  Problem: Clinical Measurements: Goal: Diagnostic test results will improve Outcome: Progressing Goal: Respiratory complications will improve Outcome: Progressing   Problem: Nutrition: Goal: Adequate nutrition will be maintained Outcome: Progressing   Problem: Coping: Goal: Level of anxiety will decrease Outcome: Progressing   Problem: Elimination: Goal: Will not experience complications related to bowel motility Outcome: Progressing

## 2023-08-22 NOTE — Progress Notes (Signed)
 PROGRESS NOTE                                                                                                                                                                                                             Patient Demographics:    Tania Perrott, is a 78 y.o. male, DOB - [birth year], FMW:[id number]  Outpatient Primary MD for the patient is Alray Loader, MD    LOS - 2  Admit date - 08/19/2023    Chief Complaint  Patient presents with   Abdominal Pain   Emesis       Brief Narrative (HPI from H&P)   78 y.o. male with medical history significant of hypertension, hyperlipidemia, GERD, diabetes, narcolepsy with cataplexy, cataract, emphysema presenting with abdominal pain, nausea, vomiting.   Patient reports that 2 days ago his [facility] went out at his home.  He tried to stay there for a while but then he began to feel unwell and went to his daughter's house.  At his daughters out he had few episodes of nausea vomiting and abdominal discomfort along with cough and shortness of breath, came to the ER where he was diagnosed with SBO along with possible aspiration pneumonia and admitted to the hospital.     Subjective:   Patient in bed, appears comfortable, denies any headache, no fever, no chest pain or pressure, no shortness of breath , no abdominal pain. No focal weakness.   Assessment  & Plan :   SBO > Patient with history of previous hernia repair surgery, likely due to adhesions, being treated conservatively with bowel rest,.  Conservatively bowel function is improved NG tube discontinued 08/21/2023, advance to soft diet, if stable likely discharge home on 08/23/2023 General Surgery on.   Pneumonia with acute hypoxic respiratory failure > Suspicious for aspiration pneumonia after episodes of nausea vomiting, Unasyn  and monitor.  Has developed some hypoxia for which she is getting 2 L supplemental oxygen, encouraged to sit in  chair use flutter and I-S.  Monitor clinically.   Hypertension - As needed IV hydralazine    Hyperlipidemia  - Hold home statin rosuvastatin  while n.p.o.   GERD - PPI IV   Hypokalemia and hypophosphatemia.  Replaced.    Narcolepsy with cataplexy - Resume clomipramine , Ritalin , sodium oxybate  while n.p.o. - Patient does note that he has had trouble with sleepwalking  when he is not taking his medication, notified nursing staff of this history so that night shift can be aware and watch for it.   Cataract Emphysema - Supportive care  DM2 - SSI  CBG (last 3)  Recent Labs    08/21/23 2316 08/22/23 0326 08/22/23 0848  GLUCAP 121* 124* 125*          Condition - Fair  Family Communication  : None present  Code Status :  Full  Consults  :   CCS  PUD Prophylaxis : PPI   Procedures  :     CT chest abdomen pelvis.    CTA CHEST: 1. Negative for acute pulmonary embolism. 2. Centrilobular micro nodularity and tree-in-bud opacities in the right lower lobe, likely infectious/inflammatory. 3. Similar smooth pleural thickening in the left thorax with associated small pleural effusion and pleural calcifications. Consolidation and bronchiolectasis in the left lower lung likely due to round atelectasis and fibrosis. This has slightly increased compared to 07/20/2023 and superimposed infection is not excluded.   CT ABDOMEN AND PELVIS: Small bowel obstruction with abrupt transition point distal to a segment of fecalized small bowel in the low central abdomen. Aortic Atherosclerosis (ICD10-I70.0).      Disposition Plan  :    Status is: Inpatient   DVT Prophylaxis  :    heparin  injection 5,000 Units Start: 08/21/23 1400 SCDs Start: 08/20/23 1433, heparin  added   Lab Results  Component Value Date   PLT 251 08/22/2023    Diet :  Diet Order             DIET SOFT Fluid consistency: Thin  Diet effective now                    Inpatient Medications  Scheduled  Meds:  heparin  injection (subcutaneous)  5,000 Units Subcutaneous Q8H   insulin  aspart  0-6 Units Subcutaneous Q4H   pantoprazole  (PROTONIX ) IV  40 mg Intravenous Q24H   sodium chloride  flush  3 mL Intravenous Q12H   Continuous Infusions:  ampicillin -sulbactam (UNASYN ) IV 3 g (08/22/23 0918)   dextrose  5 % with KCl 20 mEq / L 20 mEq (08/21/23 2319)   potassium PHOSPHATE  IVPB (in mmol) 30 mmol (08/22/23 0650)   PRN Meds:.acetaminophen  **OR** acetaminophen , hydrALAZINE , HYDROmorphone  (DILAUDID ) injection, ondansetron  **OR** ondansetron  (ZOFRAN ) IV    Objective:   Vitals:   08/21/23 1723 08/21/23 2051 08/21/23 2318 08/22/23 0325  BP:  129/67 (!) 152/68 (!) 150/73  Pulse: 74 78 77 90  Resp: 20 20 17 19   Temp: 97.9 F (36.6 C) 97.8 F (36.6 C) 97.9 F (36.6 C) 97.8 F (36.6 C)  TempSrc: Oral Oral Oral Oral  SpO2: 98% 98% 93% 91%  Weight:      Height:        Wt Readings from Last 3 Encounters:  08/19/23 92.1 kg  01/25/20 81.6 kg  04/14/18 89.4 kg     Intake/Output Summary (Last 24 hours) at 08/22/2023 0934 Last data filed at 08/21/2023 2100 Gross per 24 hour  Intake 794.85 ml  Output 100 ml  Net 694.85 ml     Physical Exam  Awake Alert, No new F.N deficits, Normal affect [place].AT,PERRAL Supple Neck, No JVD,   Symmetrical Chest wall movement, Good air movement bilaterally, CTAB RRR,No Gallops,Rubs or new Murmurs,  +ve B.Sounds, Abd Soft, No tenderness,   No Cyanosis, Clubbing or edema       Data Review:    Recent Labs  Lab 08/19/23  2112 08/20/23 1507 08/21/23 0459 08/22/23 0409  WBC 11.1* 6.3 5.8 5.8  HGB 17.0 14.1 12.3* 12.3*  HCT 52.4* 44.6 39.9 37.8*  PLT 408* 327 281 251  MCV 72.0* 72.6* 74.6* 74.0*  MCH 23.4* 23.0* 23.0* 24.1*  MCHC 32.4 31.6 30.8 32.5  RDW 18.4* 17.0* 16.0* 15.9*  LYMPHSABS  --   --   --  1.5  MONOABS  --   --   --  0.7  EOSABS  --   --   --  0.4  BASOSABS  --   --   --  0.0    Recent Labs  Lab 08/19/23 2112 08/20/23 0048  08/20/23 1507 08/21/23 0459 08/21/23 0756 08/22/23 0409  NA 138  --  140 141  --  135  K 4.2  --  4.2 3.6  --  3.4*  CL 93*  --  98 102  --  99  CO2 24  --  30 34*  --  30  ANIONGAP 21*  --  12 5  --  6  GLUCOSE 192*  --  146* 106*  --  110*  BUN 18  --  15 16  --  9  CREATININE 1.19  --  1.00 0.90  --  0.71  AST 31  --  22 21  --  29  ALT 15  --  15 15  --  21  ALKPHOS 65  --  37* 29*  --  28*  BILITOT 0.6  --  0.4 <0.2  --  0.4  ALBUMIN 4.1  --  2.6* 2.2*  --  2.2*  LATICACIDVEN  --  1.3  --   --   --   --   MG  --   --   --   --  1.8 1.7  PHOS  --   --   --   --   --  2.0*  CALCIUM  11.3*  --  9.2 8.5*  --  8.1*      Recent Labs  Lab 08/19/23 2112 08/20/23 0048 08/20/23 1507 08/21/23 0459 08/21/23 0756 08/22/23 0409  LATICACIDVEN  --  1.3  --   --   --   --   MG  --   --   --   --  1.8 1.7  CALCIUM  11.3*  --  9.2 8.5*  --  8.1*    --------------------------------------------------------------------------------------------------------------- Lab Results  Component Value Date   TRIG 126 02/13/2020    Lab Results  Component Value Date   HGBA1C 5.8 (H) 01/30/2020     Micro Results Recent Results (from the past 240 hours)  Blood culture (routine x 2)     Status: None (Preliminary result)   Collection Time: 08/19/23  9:10 PM   Specimen: BLOOD  Result Value Ref Range Status   Specimen Description   Final    BLOOD LEFT ANTECUBITAL Performed at [facility], [address] [place], [place] [address]    Special Requests   Final    BOTTLES DRAWN AEROBIC AND ANAEROBIC Blood Culture adequate volume Performed at [facility], [address], [place], [place] [address]    Culture   Final    NO GROWTH 2 DAYS Performed at [facility], [address], [place], [place] [address]    Report Status PENDING  Incomplete  Resp panel by RT-PCR (RSV, Flu A&B, Covid) Anterior Nasal Swab     Status: None   Collection Time:  08/20/23 12:04 AM   Specimen: Anterior Nasal Swab  Result Value Ref Range Status   SARS Coronavirus 2 by RT PCR NEGATIVE NEGATIVE Final    Comment: (NOTE) SARS-CoV-2 target nucleic acids are NOT DETECTED.  The SARS-CoV-2 RNA is generally detectable in upper respiratory specimens during the acute phase of infection. The lowest concentration of SARS-CoV-2 viral copies this assay can detect is 138 copies/mL. A negative result does not preclude SARS-Cov-2 infection and should not be used as the sole basis for treatment or other patient management decisions. A negative result may occur with  improper specimen collection/handling, submission of specimen other than nasopharyngeal swab, presence of viral mutation(s) within the areas targeted by this assay, and inadequate number of viral copies(<138 copies/mL). A negative result must be combined with clinical observations, patient history, and epidemiological information. The expected result is Negative.  Fact Sheet for Patients:  BloggerCourse.com  Fact Sheet for Healthcare Providers:  SeriousBroker.it  This test is no t yet approved or cleared by the [place]  FDA and  has been authorized for detection and/or diagnosis of SARS-CoV-2 by FDA under an Emergency Use Authorization (EUA). This EUA will remain  in effect (meaning this test can be used) for the duration of the COVID-19 declaration under Section 564(b)(1) of the Act, 21 U.S.C.section 360bbb-3(b)(1), unless the authorization is terminated  or revoked sooner.       Influenza A by PCR NEGATIVE NEGATIVE Final   Influenza B by PCR NEGATIVE NEGATIVE Final    Comment: (NOTE) The Xpert Xpress SARS-CoV-2/FLU/RSV plus assay is intended as an aid in the diagnosis of influenza from Nasopharyngeal swab specimens and should not be used as a sole basis for treatment. Nasal washings and aspirates are unacceptable for Xpert Xpress  SARS-CoV-2/FLU/RSV testing.  Fact Sheet for Patients: BloggerCourse.com  Fact Sheet for Healthcare Providers: SeriousBroker.it  This test is not yet approved or cleared by the [place]  FDA and has been authorized for detection and/or diagnosis of SARS-CoV-2 by FDA under an Emergency Use Authorization (EUA). This EUA will remain in effect (meaning this test can be used) for the duration of the COVID-19 declaration under Section 564(b)(1) of the Act, 21 U.S.C. section 360bbb-3(b)(1), unless the authorization is terminated or revoked.     Resp Syncytial Virus by PCR NEGATIVE NEGATIVE Final    Comment: (NOTE) Fact Sheet for Patients: BloggerCourse.com  Fact Sheet for Healthcare Providers: SeriousBroker.it  This test is not yet approved or cleared by the [place]  FDA and has been authorized for detection and/or diagnosis of SARS-CoV-2 by FDA under an Emergency Use Authorization (EUA). This EUA will remain in effect (meaning this test can be used) for the duration of the COVID-19 declaration under Section 564(b)(1) of the Act, 21 U.S.C. section 360bbb-3(b)(1), unless the authorization is terminated or revoked.  Performed at [facility], [address] [place], [place] [address]   Blood culture (routine x 2)     Status: None (Preliminary result)   Collection Time: 08/20/23 12:48 AM   Specimen: BLOOD RIGHT FOREARM  Result Value Ref Range Status   Specimen Description   Final    BLOOD RIGHT FOREARM Performed at [facility], [address] [place], [place] [address]    Special Requests   Final    BOTTLES DRAWN AEROBIC AND ANAEROBIC Blood Culture adequate volume Performed at [facility], [address] [place], [place] [address]    Culture   Final    NO GROWTH 2 DAYS  Performed at [facility],  [address], [place], [place] [address]    Report Status PENDING  Incomplete    Radiology Report DG Chest Port 1 View Result Date: 08/21/2023 CLINICAL DATA:  : 78 year old male with possible small obstruction. Shortness of breath. EXAM: PORTABLE CHEST 1 VIEW COMPARISON:  CTA chest 08/19/2023 and earlier. FINDINGS: Portable AP semi upright view at 0604 hours. Enteric tube terminating in the epigastrium as seen on abdominal radiographs today. Stable lung volumes and mediastinal contours. Left lung base fibrothorax. No superimposed pneumothorax, pulmonary edema, or new pulmonary opacity. Stable visualized osseous structures. IMPRESSION: 1. Left lung base fibrothorax. No new cardiopulmonary abnormality identified. 2. Enteric tube terminating in the epigastrium. Electronically Signed   By: VEAR Hurst M.D.   On: 08/21/2023 07:05   DG Abd Portable 1V Result Date: 08/21/2023 CLINICAL DATA:  78 year old male with possible small bowel obstruction. EXAM: PORTABLE ABDOMEN - 1 VIEW COMPARISON:  8 hour delayed images 0149 hours today and earlier. FINDINGS: Portable AP supine view at 0606 hours. Oral contrast throughout the large bowel to the rectum. Excreted IV contrast in the bladder. Improved but unresolved mid abdominal small bowel mildly gas distended loop with wall thickening. Stable enteric tube terminating in the stomach. Stable visualized osseous structures. IMPRESSION: 1. Oral contrast throughout the large bowel to the rectum. 2. Improved but not completely normalized mid abdominal small bowel appearance since the CT on 08/19/2023. 3. Stable enteric tube. Electronically Signed   By: VEAR Hurst M.D.   On: 08/21/2023 07:02   DG Abd Portable 1V-Small Bowel Obstruction Protocol-initial, 8 hr delay Result Date: 08/21/2023 CLINICAL DATA:  78 year old male with possible small bowel obstruction, 8 hour delayed post oral contrast images. EXAM: PORTABLE ABDOMEN - 1 VIEW COMPARISON:  08/20/2023 radiographs, CT Abdomen and Pelvis  08/19/2023. FINDINGS: Three supine views at 0149 hours. Stable enteric tube. No significant residual oral contrast in the stomach. And oral contrast now present throughout the large bowel to the sigmoid, possibly the rectum. Superimposed excreted IV contrast in the urinary bladder in the central pelvis. Small bowel gas pattern also mildly improved compared to the CT on 08/19/2023. Left lung base fibrothorax redemonstrated. Degenerative changes in the spine. IMPRESSION: 1. Oral contrast now present throughout the large bowel to the distal colon. Small bowel-gas pattern also appears improved from the scout view on 08/19/2023. 2. Stable enteric tube. Excreted IV contrast in the urinary bladder. 3. Left lung base fibrothorax. Electronically Signed   By: VEAR Hurst M.D.   On: 08/21/2023 06:03   DG Abd Portable 1V Result Date: 08/20/2023 CLINICAL DATA:  Nasogastric tube placement. NG tube tape is a different measurement than initially recorded, concern for shift. EXAM: PORTABLE ABDOMEN - 1 VIEW COMPARISON:  Radiograph earlier today FINDINGS: Tip of the enteric tube in the stomach. There is enteric contrast in the stomach which obscures assessment of the side-port. Enteric contrast is also seen within dilated bowel in the upper abdomen. IMPRESSION: Tip of the enteric tube in the stomach. The side-port is obscured by enteric contrast. Electronically Signed   By: Andrea Gasman M.D.   On: 08/20/2023 18:43     Signature  -   Lavada Stank M.D on 08/22/2023 at 9:34 AM   -  To page go to [email or web address]

## 2023-08-22 NOTE — Plan of Care (Signed)
  Problem: Education: Goal: Knowledge of General Education information will improve Description: Including pain rating scale, medication(s)/side effects and non-pharmacologic comfort measures Outcome: Progressing   Problem: Health Behavior/Discharge Planning: Goal: Ability to manage health-related needs will improve Outcome: Progressing   Problem: Clinical Measurements: Goal: Ability to maintain clinical measurements within normal limits will improve Outcome: Progressing   Problem: Activity: Goal: Risk for activity intolerance will decrease Outcome: Progressing   Problem: Elimination: Goal: Will not experience complications related to bowel motility Outcome: Progressing   Problem: Safety: Goal: Ability to remain free from injury will improve Outcome: Progressing   

## 2023-08-23 DIAGNOSIS — K56609 Unspecified intestinal obstruction, unspecified as to partial versus complete obstruction: Secondary | ICD-10-CM | POA: Diagnosis not present

## 2023-08-23 LAB — CBC WITH DIFFERENTIAL/PLATELET
Abs Immature Granulocytes: 0.03 K/uL (ref 0.00–0.07)
Basophils Absolute: 0 K/uL (ref 0.0–0.1)
Basophils Relative: 0 %
Eosinophils Absolute: 0.3 K/uL (ref 0.0–0.5)
Eosinophils Relative: 4 %
HCT: 39.6 % (ref 39.0–52.0)
Hemoglobin: 12.7 g/dL — ABNORMAL LOW (ref 13.0–17.0)
Immature Granulocytes: 0 %
Lymphocytes Relative: 24 %
Lymphs Abs: 1.8 K/uL (ref 0.7–4.0)
MCH: 23.3 pg — ABNORMAL LOW (ref 26.0–34.0)
MCHC: 32.1 g/dL (ref 30.0–36.0)
MCV: 72.7 fL — ABNORMAL LOW (ref 80.0–100.0)
Monocytes Absolute: 0.7 K/uL (ref 0.1–1.0)
Monocytes Relative: 9 %
Neutro Abs: 4.6 K/uL (ref 1.7–7.7)
Neutrophils Relative %: 63 %
Platelets: 287 K/uL (ref 150–400)
RBC: 5.45 MIL/uL (ref 4.22–5.81)
RDW: 15.7 % — ABNORMAL HIGH (ref 11.5–15.5)
WBC: 7.4 K/uL (ref 4.0–10.5)
nRBC: 0 % (ref 0.0–0.2)

## 2023-08-23 LAB — COMPREHENSIVE METABOLIC PANEL WITH GFR
ALT: 48 U/L — ABNORMAL HIGH (ref 0–44)
AST: 54 U/L — ABNORMAL HIGH (ref 15–41)
Albumin: 2.4 g/dL — ABNORMAL LOW (ref 3.5–5.0)
Alkaline Phosphatase: 37 U/L — ABNORMAL LOW (ref 38–126)
Anion gap: 8 (ref 5–15)
BUN: 5 mg/dL — ABNORMAL LOW (ref 8–23)
CO2: 27 mmol/L (ref 22–32)
Calcium: 8.4 mg/dL — ABNORMAL LOW (ref 8.9–10.3)
Chloride: 100 mmol/L (ref 98–111)
Creatinine, Ser: 0.69 mg/dL (ref 0.61–1.24)
GFR, Estimated: >60 mL/min (ref >60)
Glucose, Bld: 96 mg/dL (ref 70–99)
Potassium: 3.8 mmol/L (ref 3.5–5.1)
Sodium: 135 mmol/L (ref 135–145)
Total Bilirubin: 0.5 mg/dL (ref 0.0–1.2)
Total Protein: 6.1 g/dL — ABNORMAL LOW (ref 6.5–8.1)

## 2023-08-23 LAB — GLUCOSE, CAPILLARY
Glucose-Capillary: 94 mg/dL (ref 70–99)
Glucose-Capillary: 135 mg/dL — ABNORMAL HIGH (ref 70–99)

## 2023-08-23 LAB — MAGNESIUM: Magnesium: 2 mg/dL (ref 1.7–2.4)

## 2023-08-23 LAB — PHOSPHORUS: Phosphorus: 2.6 mg/dL (ref 2.5–4.6)

## 2023-08-23 MED ORDER — AMOXICILLIN-POT CLAVULANATE 875-125 MG PO TABS: 1.0000 | ORAL_TABLET | Freq: Two times a day (BID) | ORAL | 0 refills | Status: AC

## 2023-08-23 MED ORDER — DOCUSATE SODIUM 100 MG PO CAPS: 100.0000 mg | ORAL_CAPSULE | Freq: Two times a day (BID) | ORAL | 0 refills | Status: AC | PRN

## 2023-08-23 MED ORDER — LOSARTAN POTASSIUM 25 MG PO TABS: 25.0000 mg | ORAL_TABLET | Freq: Every day | ORAL | Status: AC

## 2023-08-23 MED ORDER — MAGNESIUM HYDROXIDE 400 MG/5ML PO SUSP
30.0000 mL | Freq: Two times a day (BID) | ORAL | Status: DC
  Filled 2023-08-23: qty 30

## 2023-08-23 MED ORDER — DILTIAZEM HCL ER COATED BEADS 120 MG PO CP24
120.0000 mg | ORAL_CAPSULE | Freq: Every day | ORAL | Status: DC
  Administered 2023-08-23: 120 mg via ORAL
  Filled 2023-08-23: qty 1

## 2023-08-23 MED ORDER — DILTIAZEM HCL ER 240 MG PO CP24: 240.0000 mg | ORAL_CAPSULE | Freq: Every day | ORAL | Status: AC

## 2023-08-23 NOTE — Progress Notes (Signed)
   Subjective/Chief Complaint: No complaints. Feels good. Having lots of flatus. No bm yet   Objective: Vital signs in last 24 hours: Temp:  [97.4 F (36.3 C)-98.2 F (36.8 C)] 97.4 F (36.3 C) (07/06 0759) Pulse Rate:  [74-86] 79 (07/06 0954) Resp:  [19-21] 19 (07/06 0954) BP: (101-154)/(56-88) 140/71 (07/06 0954) SpO2:  [91 %-100 %] 91 % (07/06 0954) Last BM Date : 08/22/23  Intake/Output from previous day: 07/05 0701 - 07/06 0700 In: 243 [P.O.:240; I.V.:3] Out: 850 [Urine:850] Intake/Output this shift: Total I/O In: -  Out: 400 [Urine:400]  General appearance: alert and cooperative Resp: clear to auscultation bilaterally Cardio: regular rate and rhythm GI: soft, flat, nontender  Lab Results:  Recent Labs    08/22/23 0409 08/23/23 0448  WBC 5.8 7.4  HGB 12.3* 12.7*  HCT 37.8* 39.6  PLT 251 287   BMET Recent Labs    08/22/23 0409 08/23/23 0448  NA 135 135  K 3.4* 3.8  CL 99 100  CO2 30 27  GLUCOSE 110* 96  BUN 9 5*  CREATININE 0.71 0.69  CALCIUM  8.1* 8.4*   PT/INR No results for input(s): LABPROT, INR in the last 72 hours. ABG No results for input(s): PHART, HCO3 in the last 72 hours.  Invalid input(s): PCO2, PO2  Studies/Results: No results found.  Anti-infectives: Anti-infectives (From admission, onward)    Start     Dose/Rate Route Frequency Ordered Stop   08/23/23 0000  amoxicillin -clavulanate (AUGMENTIN ) 875-125 MG tablet        1 tablet Oral 2 times daily 08/23/23 0746     08/21/23 1015  Ampicillin -Sulbactam (UNASYN ) 3 g in sodium chloride  0.9 % 100 mL IVPB        3 g 200 mL/hr over 30 Minutes Intravenous Every 8 hours 08/21/23 0919 08/24/23 0214   08/20/23 2200  azithromycin  (ZITHROMAX ) 500 mg in sodium chloride  0.9 % 250 mL IVPB  Status:  Discontinued        500 mg 250 mL/hr over 60 Minutes Intravenous Every 24 hours 08/20/23 1436 08/21/23 0919   08/20/23 2200  cefTRIAXone  (ROCEPHIN ) 2 g in sodium chloride  0.9 % 100  mL IVPB  Status:  Discontinued        2 g 200 mL/hr over 30 Minutes Intravenous Every 24 hours 08/20/23 1436 08/21/23 0919   08/20/23 0015  cefTRIAXone  (ROCEPHIN ) 2 g in sodium chloride  0.9 % 100 mL IVPB        2 g 200 mL/hr over 30 Minutes Intravenous Once 08/20/23 0001 08/20/23 0120   08/20/23 0015  azithromycin  (ZITHROMAX ) 500 mg in sodium chloride  0.9 % 250 mL IVPB        500 mg 250 mL/hr over 60 Minutes Intravenous  Once 08/20/23 0001 08/20/23 0233       Assessment/Plan: s/p * No surgery found * Advance diet Ok for d/c from surgical standpoint.  Sbo seems to have resolved  LOS: 3 days    Deward Null III 08/23/2023

## 2023-08-23 NOTE — Progress Notes (Signed)
 DISCHARGE NOTE HOME Patrick Lam to be discharged Home per MD order. Discussed prescriptions and follow up appointments with the patient. Prescriptions given to patient; medication list explained in detail. Patient verbalized understanding.  Skin clean, dry and intact without evidence of skin break down, no evidence of skin tears noted. IV catheter discontinued intact. Site without signs and symptoms of complications. Dressing and pressure applied. Pt denies pain at the site currently. No complaints noted.  Patient free of lines, drains, and wounds.   An After Visit Summary (AVS) was printed and given to the patient. Patient escorted via wheelchair, and discharged home via private auto.  [name], RN

## 2023-08-23 NOTE — Discharge Instructions (Signed)
 Follow with Primary MD Patrick Loader, MD in 7 days   Get CBC, CMP, 2 view Chest X ray -  checked next visit with your primary MD   Activity: As tolerated with Full fall precautions use walker/cane & assistance as needed  Disposition Home    Diet: Heart Healthy   Special Instructions: If you have smoked or chewed Tobacco  in the last 2 yrs please stop smoking, stop any regular Alcohol  and or any Recreational drug use.  On your next visit with your primary care physician please Get Medicines reviewed and adjusted.  Please request your Prim.MD to go over all Hospital Tests and Procedure/Radiological results at the follow up, please get all Hospital records sent to your Prim MD by signing hospital release before you go home.  If you experience worsening of your admission symptoms, develop shortness of breath, life threatening emergency, suicidal or homicidal thoughts you must seek medical attention immediately by calling 911 or calling your MD immediately  if symptoms less severe.  You Must read complete instructions/literature along with all the possible adverse reactions/side effects for all the Medicines you take and that have been prescribed to you. Take any new Medicines after you have completely understood and accpet all the possible adverse reactions/side effects.   Do not drive when taking Pain medications.  Do not take more than prescribed Pain, Sleep and Anxiety Medications  Wear Seat belts while driving.

## 2023-08-23 NOTE — Discharge Summary (Signed)
 Patrick Lam FMW:[id number] DOB: 08/09/1945 DOA: 08/19/2023  PCP: Alray Loader, MD  Admit date: 08/19/2023  Discharge date: 08/23/2023  Admitted From: Home   Disposition:  Home   Recommendations for Outpatient Follow-up:   Follow up with PCP in 1-2 weeks  PCP Please obtain BMP/CBC, 2 view CXR in 1week,  (see Discharge instructions)   PCP Please follow up on the following pending results:    Home Health: None   Equipment/Devices: None  Consultations: CCS Discharge Condition: Stable    CODE STATUS: Full    Diet Recommendation: Heart Healthy     Chief Complaint  Patient presents with   Abdominal Pain   Emesis     Brief history of present illness from the day of admission and additional interim summary    78 y.o. male with medical history significant of hypertension, hyperlipidemia, GERD, diabetes, narcolepsy with cataplexy, cataract, emphysema presenting with abdominal pain, nausea, vomiting.   Patient reports that 2 days ago his [facility] went out at his home.  He tried to stay there for a while but then he began to feel unwell and went to his daughter's house.  At his daughters out he had few episodes of nausea vomiting and abdominal discomfort along with cough and shortness of breath, came to the ER where he was diagnosed with SBO along with possible aspiration pneumonia and admitted to the hospital.                                                                   Hospital Course   SBO > Patient with history of previous hernia repair surgery, likely due to adhesions, being treated conservatively with bowel rest, bowel function has improved NG tube discontinued 08/21/2023, tolerated diet well for 36 hours symptom-free passing flatus, no nausea vomiting or bloating, abdominal exam benign will be discharged home with  close follow-up with PCP in the outpatient setting.   Pneumonia with acute hypoxic respiratory failure > Suspicious for aspiration pneumonia after episodes of nausea vomiting, was placed on Unasyn , given I-S and flutter valve for pulmonary toiletry, now symptom-free on room air, will get 3 more days of oral Augmentin  and discharged home with PCP follow-up.   Hypertension - Blood pressure improved resuming home medications in a graduated fashion   Hyperlipidemia  -continue home statin   GERD - PPI   Narcolepsy with cataplexy - Resume clomipramine , Ritalin , sodium oxybate , no acute issue    Cataract Emphysema - Supportive care   Pre DM2 continue home regimen, diet controlled. 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   Cortical age-related cataract of right eye   Essential hypertension   Gastroesophageal reflux disease without esophagitis   Mixed hyperlipidemia   Primary narcolepsy with cataplexy   Type 2 diabetes mellitus without complication, without long-term current use of insulin  (HCC)   CAP (community acquired pneumonia)    Discharge instructions    Discharge Instructions     Diet - low sodium heart healthy   Complete by: As directed    Discharge instructions   Complete by: As directed    Follow with Primary MD Alray Loader, MD in 7 days   Get CBC, CMP, 2 view Chest X ray -  checked next visit with your primary MD   Activity: As tolerated with Full fall precautions use walker/cane & assistance as needed  Disposition Home    Diet: Heart Healthy   Special Instructions: If you have smoked or chewed Tobacco  in the last 2 yrs please stop smoking, stop any regular Alcohol  and or any Recreational drug use.  On your next  visit with your primary care physician please Get Medicines reviewed and adjusted.  Please request your Prim.MD to go over all Hospital Tests and Procedure/Radiological results at the follow up, please get all Hospital records sent to your Prim MD by signing hospital release before you go home.  If you experience worsening of your admission symptoms, develop shortness of breath, life threatening emergency, suicidal or homicidal thoughts you must seek medical attention immediately by calling 911 or calling your MD immediately  if symptoms less severe.  You Must read complete instructions/literature along with all the possible adverse reactions/side effects for all the Medicines you take and that have been prescribed to you. Take any new Medicines after you have completely understood and accpet all the possible adverse reactions/side effects.   Do not drive when taking Pain medications.  Do not take more than prescribed Pain, Sleep and Anxiety Medications  Wear Seat belts while driving.   Increase activity slowly   Complete by: As directed        Discharge Medications   Allergies as of 08/23/2023   No Known Allergies      Medication List     TAKE these medications    acetaminophen  500 MG tablet Commonly known as: TYLENOL  Take 1,000 mg by mouth every 6 (six) hours as needed for mild pain, moderate pain, fever or headache.   amoxicillin  500 MG tablet Commonly known as: AMOXIL  Take 2,000 mg by mouth as needed. *dental appt*   amoxicillin -clavulanate 875-125 MG tablet Commonly known as: AUGMENTIN  Take 1 tablet by mouth 2 (two) times daily.   clomiPRAMINE  25 MG capsule Commonly known as: ANAFRANIL  Take 25 mg by mouth 3 (three) times daily.   diltiazem  240 MG 24 hr capsule Commonly known as: DILACOR XR  Take 1 capsule (240 mg total) by mouth daily. Start taking on: August 24, 2023   docusate sodium  100 MG capsule Commonly known as: Colace Take 1 capsule (100 mg total) by mouth 2  (two) times daily as needed for mild constipation.   feeding supplement Liqd Take 237 mLs by mouth 3 (three) times daily with meals.   GARLIC OIL PO Take 1 tablet by mouth daily.   hydrochlorothiazide  25 MG tablet Commonly known as: HYDRODIURIL  Take 25 mg by mouth daily.   losartan  25 MG tablet Commonly known as: COZAAR  Take 1 tablet (25 mg total) by mouth daily. Start taking on: August 24, 2023  metFORMIN  500 MG tablet Commonly known as: GLUCOPHAGE  Take 500 mg by mouth daily with breakfast.   methocarbamol  500 MG tablet Commonly known as: Robaxin  Take 1 tablet (500 mg total) by mouth every 8 (eight) hours as needed for muscle spasms.   methylphenidate  20 MG tablet Commonly known as: RITALIN  Take 20 mg by mouth daily as needed (narcolepsy).   multivitamin with minerals Tabs tablet Take 1 tablet by mouth daily.   omeprazole  20 MG capsule Commonly known as: PRILOSEC Take 20 mg by mouth daily.   Periogard  0.12 % solution Generic drug: chlorhexidine    rosuvastatin  10 MG tablet Commonly known as: CRESTOR  Take 10 mg by mouth daily.   vitamin B-12 500 MCG tablet Commonly known as: CYANOCOBALAMIN  Take 500 mcg by mouth daily.   Xyrem  500 MG/ML Soln Generic drug: Sodium Oxybate  Take 4.5 g by mouth 2 (two) times daily. During the night: 12am and 2am         Follow-up Information     Alray Loader, MD. Schedule an appointment as soon as possible for a visit in 1 week(s).   Specialty: Family Medicine Contact information: [address] [place] [address] [phone]                 Major procedures and Radiology Reports - PLEASE review detailed and final reports thoroughly  -      DG Chest Port 1 View Result Date: 08/21/2023 CLINICAL DATA:  : 78 year old male with possible small obstruction. Shortness of breath. EXAM: PORTABLE CHEST 1 VIEW COMPARISON:  CTA chest 08/19/2023 and earlier. FINDINGS: Portable AP semi upright view at 0604  hours. Enteric tube terminating in the epigastrium as seen on abdominal radiographs today. Stable lung volumes and mediastinal contours. Left lung base fibrothorax. No superimposed pneumothorax, pulmonary edema, or new pulmonary opacity. Stable visualized osseous structures. IMPRESSION: 1. Left lung base fibrothorax. No new cardiopulmonary abnormality identified. 2. Enteric tube terminating in the epigastrium. Electronically Signed   By: VEAR Hurst M.D.   On: 08/21/2023 07:05   DG Abd Portable 1V Result Date: 08/21/2023 CLINICAL DATA:  78 year old male with possible small bowel obstruction. EXAM: PORTABLE ABDOMEN - 1 VIEW COMPARISON:  8 hour delayed images 0149 hours today and earlier. FINDINGS: Portable AP supine view at 0606 hours. Oral contrast throughout the large bowel to the rectum. Excreted IV contrast in the bladder. Improved but unresolved mid abdominal small bowel mildly gas distended loop with wall thickening. Stable enteric tube terminating in the stomach. Stable visualized osseous structures. IMPRESSION: 1. Oral contrast throughout the large bowel to the rectum. 2. Improved but not completely normalized mid abdominal small bowel appearance since the CT on 08/19/2023. 3. Stable enteric tube. Electronically Signed   By: VEAR Hurst M.D.   On: 08/21/2023 07:02   DG Abd Portable 1V-Small Bowel Obstruction Protocol-initial, 8 hr delay Result Date: 08/21/2023 CLINICAL DATA:  78 year old male with possible small bowel obstruction, 8 hour delayed post oral contrast images. EXAM: PORTABLE ABDOMEN - 1 VIEW COMPARISON:  08/20/2023 radiographs, CT Abdomen and Pelvis 08/19/2023. FINDINGS: Three supine views at 0149 hours. Stable enteric tube. No significant residual oral contrast in the stomach. And oral contrast now present throughout the large bowel to the sigmoid, possibly the rectum. Superimposed excreted IV contrast in the urinary bladder in the central pelvis. Small bowel gas pattern also mildly improved compared  to the CT on 08/19/2023. Left lung base fibrothorax redemonstrated. Degenerative changes in the spine. IMPRESSION: 1. Oral contrast  now present throughout the large bowel to the distal colon. Small bowel-gas pattern also appears improved from the scout view on 08/19/2023. 2. Stable enteric tube. Excreted IV contrast in the urinary bladder. 3. Left lung base fibrothorax. Electronically Signed   By: VEAR Hurst M.D.   On: 08/21/2023 06:03   DG Abd Portable 1V Result Date: 08/20/2023 CLINICAL DATA:  Nasogastric tube placement. NG tube tape is a different measurement than initially recorded, concern for shift. EXAM: PORTABLE ABDOMEN - 1 VIEW COMPARISON:  Radiograph earlier today FINDINGS: Tip of the enteric tube in the stomach. There is enteric contrast in the stomach which obscures assessment of the side-port. Enteric contrast is also seen within dilated bowel in the upper abdomen. IMPRESSION: Tip of the enteric tube in the stomach. The side-port is obscured by enteric contrast. Electronically Signed   By: Andrea Gasman M.D.   On: 08/20/2023 18:43   DG Abdomen 1 View Result Date: 08/20/2023 CLINICAL DATA:  NG tube placement EXAM: ABDOMEN - 1 VIEW COMPARISON:  CT abdomen pelvis 08/19/2023 FINDINGS: Enteric tube tip and side-port in the stomach. Similar dilated loops of small bowel in the upper abdomen. IMPRESSION: Enteric tube tip and side-port in the stomach. Electronically Signed   By: Norman Gatlin M.D.   On: 08/20/2023 03:42   CT Angio Chest PE W and/or Wo Contrast Result Date: 08/19/2023 CLINICAL DATA:  Right lower quadrant abdominal pain with nausea and vomiting, epigastric pain, shortness of breath over the last 3 days and increasing weakness. Tachypnea. Poor p.o. intake. EXAM: CT ANGIOGRAPHY CHEST CT ABDOMEN AND PELVIS WITH CONTRAST TECHNIQUE: Multidetector CT imaging of the chest was performed using the standard protocol during bolus administration of intravenous contrast. Multiplanar CT image  reconstructions and MIPs were obtained to evaluate the vascular anatomy. Multidetector CT imaging of the abdomen and pelvis was performed using the standard protocol during bolus administration of intravenous contrast. RADIATION DOSE REDUCTION: This exam was performed according to the departmental dose-optimization program which includes automated exposure control, adjustment of the mA and/or kV according to patient size and/or use of iterative reconstruction technique. CONTRAST:  OMNIPAQUE  IOHEXOL  350 MG/ML SOLN COMPARISON:  Same day chest radiograph; CT 02/03/2020; CT 07/20/2023 FINDINGS: CTA CHEST FINDINGS Cardiovascular: Negative for PE. No pericardial effusion. Coronary artery and aortic atherosclerotic calcification. No aortic dissection. Lipomatous hypertrophy of the intra-atrial septum. Mediastinum/Nodes: Trachea and esophagus are unremarkable. Unchanged prominent mediastinal and hilar lymph nodes measuring up to 1.1 cm in the right hilum (series 3/image 137). Lungs/Pleura: Emphysema. Similar smooth pleural thickening in the left thorax with associated small pleural effusion and pleural calcifications. Consolidation and bronchiolectasis in the left lower lung likely due to round atelectasis and fibrosis. This has slightly increased compared to 07/20/2023 and superimposed infection is not excluded. Diffuse bronchial wall thickening. Centrilobular micro nodularity and tree-in-bud opacities in the right lower lobe. No pneumothorax. Musculoskeletal: No acute fracture. Review of the MIP images confirms the above findings. CT ABDOMEN and PELVIS FINDINGS Hepatobiliary: No acute abnormality. Pancreas: Unremarkable. Spleen: Unremarkable. Adrenals/Urinary Tract: Normal adrenal glands. No urinary calculi or hydronephrosis. Unremarkable bladder. Stomach/Bowel: Stomach is within normal limits. Dilation of the small bowel with abrupt transition point distal to a segment of fecalized small bowel in the low central  abdomen (series 6/image 60). The small bowel downstream from the transition point is decompressed. Normal caliber colon without wall thickening. Vascular/Lymphatic: Aortic atherosclerosis. No enlarged abdominal or pelvic lymph nodes. Reproductive: Unremarkable. Other: No free intraperitoneal air.  No free  fluid. Musculoskeletal: No acute fracture. 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 Review of the MIP images confirms the above findings. IMPRESSION: CTA CHEST: 1. Negative for acute pulmonary embolism. 2. Centrilobular micro nodularity and tree-in-bud opacities in the right lower lobe, likely infectious/inflammatory. 3. Similar smooth pleural thickening in the left thorax with associated small pleural effusion and pleural calcifications. Consolidation and bronchiolectasis in the left lower lung likely due to round atelectasis and fibrosis. This has slightly increased compared to 07/20/2023 and superimposed infection is not excluded. CT ABDOMEN AND PELVIS: Small bowel obstruction with abrupt transition point distal to a segment of fecalized small bowel in the low central abdomen. Aortic Atherosclerosis (ICD10-I70.0). Electronically Signed   By: Norman Gatlin M.D.   On: 08/19/2023 23:43   CT ABDOMEN PELVIS W CONTRAST Result Date: 08/19/2023 CLINICAL DATA:  Right lower quadrant abdominal pain with nausea and vomiting, epigastric pain, shortness of breath over the last 3 days and increasing weakness. Tachypnea. Poor p.o. intake. EXAM: CT ANGIOGRAPHY CHEST CT ABDOMEN AND PELVIS WITH CONTRAST TECHNIQUE: Multidetector CT imaging of the chest was performed using the standard protocol during bolus administration of intravenous contrast. Multiplanar CT image reconstructions and MIPs were obtained to evaluate the vascular anatomy. Multidetector CT imaging of the abdomen and pelvis was performed using the standard protocol during bolus administration of intravenous contrast. RADIATION DOSE REDUCTION: This  exam was performed according to the departmental dose-optimization program which includes automated exposure control, adjustment of the mA and/or kV according to patient size and/or use of iterative reconstruction technique. CONTRAST:  OMNIPAQUE  IOHEXOL  350 MG/ML SOLN COMPARISON:  Same day chest radiograph; CT 02/03/2020; CT 07/20/2023 FINDINGS: CTA CHEST FINDINGS Cardiovascular: Negative for PE. No pericardial effusion. Coronary artery and aortic atherosclerotic calcification. No aortic dissection. Lipomatous hypertrophy of the intra-atrial septum. Mediastinum/Nodes: Trachea and esophagus are unremarkable. Unchanged prominent mediastinal and hilar lymph nodes measuring up to 1.1 cm in the right hilum (series 3/image 137). Lungs/Pleura: Emphysema. Similar smooth pleural thickening in the left thorax with associated small pleural effusion and pleural calcifications. Consolidation and bronchiolectasis in the left lower lung likely due to round atelectasis and fibrosis. This has slightly increased compared to 07/20/2023 and superimposed infection is not excluded. Diffuse bronchial wall thickening. Centrilobular micro nodularity and tree-in-bud opacities in the right lower lobe. No pneumothorax. Musculoskeletal: No acute fracture. Review of the MIP images confirms the above findings. CT ABDOMEN and PELVIS FINDINGS Hepatobiliary: No acute abnormality. Pancreas: Unremarkable. Spleen: Unremarkable. Adrenals/Urinary Tract: Normal adrenal glands. No urinary calculi or hydronephrosis. Unremarkable bladder. Stomach/Bowel: Stomach is within normal limits. Dilation of the small bowel with abrupt transition point distal to a segment of fecalized small bowel in the low central abdomen (series 6/image 60). The small bowel downstream from the transition point is decompressed. Normal caliber colon without wall thickening. Vascular/Lymphatic: Aortic atherosclerosis. No enlarged abdominal or pelvic lymph nodes. Reproductive:  Unremarkable. Other: No free intraperitoneal air.  No free fluid. Musculoskeletal: No acute fracture. 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 Review of the MIP images confirms the above findings. IMPRESSION: CTA CHEST: 1. Negative for acute pulmonary embolism. 2. Centrilobular micro nodularity and tree-in-bud opacities in the right lower lobe, likely infectious/inflammatory. 3. Similar smooth pleural thickening in the left thorax with associated small pleural effusion and pleural calcifications. Consolidation and bronchiolectasis in the left lower lung likely due to round atelectasis and fibrosis. This has slightly increased compared to 07/20/2023 and superimposed infection is not excluded. CT ABDOMEN AND PELVIS: Small  bowel obstruction with abrupt transition point distal to a segment of fecalized small bowel in the low central abdomen. Aortic Atherosclerosis (ICD10-I70.0). Electronically Signed   By: Norman Gatlin M.D.   On: 08/19/2023 23:43   DG Chest Port 1 View Result Date: 08/19/2023 CLINICAL DATA:  Shortness of breath.  Weakness. EXAM: PORTABLE CHEST 1 VIEW COMPARISON:  Chest radiograph 11/27/2022.  CT 07/20/2023 FINDINGS: Again seen volume loss in the left hemithorax. Chronic left pleural thickening and opacity at the lung base typical of round atelectasis. Chronic left apical opacity. The heart is normal in size. Mild bronchial thickening. No pneumothorax. No pulmonary edema. IMPRESSION: 1. Chronic left lung volume loss with pleural thickening and opacity at the lung base, round atelectasis on prior CT. 2. Mild bronchial thickening. Electronically Signed   By: Andrea Gasman M.D.   On: 08/19/2023 21:49    Micro Results    Recent Results (from the past 240 hours)  Blood culture (routine x 2)     Status: None (Preliminary result)   Collection Time: 08/19/23  9:10 PM   Specimen: BLOOD  Result Value Ref Range Status   Specimen Description   Final    BLOOD LEFT  ANTECUBITAL Performed at [facility], [address] [place], [place] [address]    Special Requests   Final    BOTTLES DRAWN AEROBIC AND ANAEROBIC Blood Culture adequate volume Performed at [facility], [address] [place], [place] [address]    Culture   Final    NO GROWTH 3 DAYS Performed at [facility], [address] [place], [place] [address]    Report Status PENDING  Incomplete  Resp panel by RT-PCR (RSV, Flu A&B, Covid) Anterior Nasal Swab     Status: None   Collection Time: 08/20/23 12:04 AM   Specimen: Anterior Nasal Swab  Result Value Ref Range Status   SARS Coronavirus 2 by RT PCR NEGATIVE NEGATIVE Final    Comment: (NOTE) SARS-CoV-2 target nucleic acids are NOT DETECTED.  The SARS-CoV-2 RNA is generally detectable in upper respiratory specimens during the acute phase of infection. The lowest concentration of SARS-CoV-2 viral copies this assay can detect is 138 copies/mL. A negative result does not preclude SARS-Cov-2 infection and should not be used as the sole basis for treatment or other patient management decisions. A negative result may occur with  improper specimen collection/handling, submission of specimen other than nasopharyngeal swab, presence of viral mutation(s) within the areas targeted by this assay, and inadequate number of viral copies(<138 copies/mL). A negative result must be combined with clinical observations, patient history, and epidemiological information. The expected result is Negative.  Fact Sheet for Patients:  BloggerCourse.com  Fact Sheet for Healthcare Providers:  SeriousBroker.it  This test is no t yet approved or cleared by the [place]  FDA and  has been authorized for detection and/or diagnosis of SARS-CoV-2 by FDA under an Emergency Use Authorization (EUA). This EUA will remain  in effect (meaning this test can be used) for  the duration of the COVID-19 declaration under Section 564(b)(1) of the Act, 21 U.S.C.section 360bbb-3(b)(1), unless the authorization is terminated  or revoked sooner.       Influenza A by PCR NEGATIVE NEGATIVE Final   Influenza B by PCR NEGATIVE NEGATIVE Final    Comment: (NOTE) The Xpert Xpress SARS-CoV-2/FLU/RSV plus assay is intended as an aid in the diagnosis of influenza from Nasopharyngeal swab specimens and should not be used as a sole basis  for treatment. Nasal washings and aspirates are unacceptable for Xpert Xpress SARS-CoV-2/FLU/RSV testing.  Fact Sheet for Patients: BloggerCourse.com  Fact Sheet for Healthcare Providers: SeriousBroker.it  This test is not yet approved or cleared by the [place]  FDA and has been authorized for detection and/or diagnosis of SARS-CoV-2 by FDA under an Emergency Use Authorization (EUA). This EUA will remain in effect (meaning this test can be used) for the duration of the COVID-19 declaration under Section 564(b)(1) of the Act, 21 U.S.C. section 360bbb-3(b)(1), unless the authorization is terminated or revoked.     Resp Syncytial Virus by PCR NEGATIVE NEGATIVE Final    Comment: (NOTE) Fact Sheet for Patients: BloggerCourse.com  Fact Sheet for Healthcare Providers: SeriousBroker.it  This test is not yet approved or cleared by the [place]  FDA and has been authorized for detection and/or diagnosis of SARS-CoV-2 by FDA under an Emergency Use Authorization (EUA). This EUA will remain in effect (meaning this test can be used) for the duration of the COVID-19 declaration under Section 564(b)(1) of the Act, 21 U.S.C. section 360bbb-3(b)(1), unless the authorization is terminated or revoked.  Performed at [facility], [address] [place], [place] [address]   Blood culture (routine x 2)     Status:  None (Preliminary result)   Collection Time: 08/20/23 12:48 AM   Specimen: BLOOD RIGHT FOREARM  Result Value Ref Range Status   Specimen Description   Final    BLOOD RIGHT FOREARM Performed at [facility], [address] [place], [place] [address]    Special Requests   Final    BOTTLES DRAWN AEROBIC AND ANAEROBIC Blood Culture adequate volume Performed at [facility], [address], [place], [place] [address]    Culture   Final    NO GROWTH 3 DAYS Performed at [facility], [address], [place], [place] [address]    Report Status PENDING  Incomplete    Today   Subjective    Patrick Lam today has no headache,no chest abdominal pain,no new weakness tingling or numbness, feels much better wants to go home today.    Objective   Blood pressure 110/65, pulse 76, temperature 97.8 F (36.6 C), temperature source Oral, resp. rate 20, height 6' 1 (1.854 m), weight 92.1 kg, SpO2 94%.   Intake/Output Summary (Last 24 hours) at 08/23/2023 0746 Last data filed at 08/22/2023 2142 Gross per 24 hour  Intake 243 ml  Output 850 ml  Net -607 ml    Exam  Awake Alert, No new F.N deficits,    [place] [place]AT,PERRAL Supple Neck,   Symmetrical Chest wall movement, Good air movement bilaterally, CTAB RRR,No Gallops,   +ve B.Sounds, Abd Soft, Non tender,  No Cyanosis, Clubbing or edema    Data Review   Recent Labs  Lab 08/19/23 2112 08/20/23 1507 08/21/23 0459 08/22/23 0409 08/23/23 0448  WBC 11.1* 6.3 5.8 5.8 7.4  HGB 17.0 14.1 12.3* 12.3* 12.7*  HCT 52.4* 44.6 39.9 37.8* 39.6  PLT 408* 327 281 251 287  MCV 72.0* 72.6* 74.6* 74.0* 72.7*  MCH 23.4* 23.0* 23.0* 24.1* 23.3*  MCHC 32.4 31.6 30.8 32.5 32.1  RDW 18.4* 17.0* 16.0* 15.9* 15.7*  LYMPHSABS  --   --   --  1.5 1.8  MONOABS  --   --   --  0.7 0.7  EOSABS  --   --   --  0.4 0.3  BASOSABS  --   --   --  0.0 0.0  Recent Labs  Lab 08/19/23 2112 08/20/23 0048 08/20/23 1507  08/21/23 0459 08/21/23 0756 08/22/23 0409 08/23/23 0448  NA 138  --  140 141  --  135 135  K 4.2  --  4.2 3.6  --  3.4* 3.8  CL 93*  --  98 102  --  99 100  CO2 24  --  30 34*  --  30 27  ANIONGAP 21*  --  12 5  --  6 8  GLUCOSE 192*  --  146* 106*  --  110* 96  BUN 18  --  15 16  --  9 5*  CREATININE 1.19  --  1.00 0.90  --  0.71 0.69  AST 31  --  22 21  --  29 54*  ALT 15  --  15 15  --  21 48*  ALKPHOS 65  --  37* 29*  --  28* 37*  BILITOT 0.6  --  0.4 <0.2  --  0.4 0.5  ALBUMIN 4.1  --  2.6* 2.2*  --  2.2* 2.4*  LATICACIDVEN  --  1.3  --   --   --   --   --   MG  --   --   --   --  1.8 1.7 2.0  PHOS  --   --   --   --   --  2.0* 2.6  CALCIUM  11.3*  --  9.2 8.5*  --  8.1* 8.4*    Total Time in preparing paper work, data evaluation and todays exam - 35 minutes  Signature  -    Lavada Stank M.D on 08/23/2023 at 7:46 AM   -  To page go to [email or web address]

## 2023-08-23 NOTE — TOC Transition Note (Signed)
 Transition of Care [facility]) - Discharge Note   Patient Details  Name: Patrick Lam MRN: [id number] Date of Birth: 19-May-1945  Transition of Care [facility]) CM/SW Contact:  Marval Gell, RN Phone Number: 08/23/2023, 8:27 AM   Clinical Narrative:     Plan for DC to home today. Spoke w attending, states patient has PRN O2 at home and is on RA, no need for additional O2. No TOC needs identified for DC.   Final next level of care: Home/Self Care Barriers to Discharge: No Barriers Identified   Patient Goals and CMS Choice            Discharge Placement                       Discharge Plan and Services Additional resources added to the After Visit Summary for                  DME Arranged: N/A         HH Arranged: NA          Social Drivers of Health (SDOH) Interventions SDOH Screenings   Food Insecurity: No Food Insecurity (08/20/2023)  Housing: Low Risk  (08/20/2023)  Transportation Needs: No Transportation Needs (08/20/2023)  Utilities: Not At Risk (08/20/2023)  Social Connections: Socially Isolated (08/20/2023)  Tobacco Use: High Risk (08/19/2023)     Readmission Risk Interventions     No data to display

## 2023-08-25 LAB — CULTURE, BLOOD (ROUTINE X 2)
Culture: NO GROWTH
Special Requests: ADEQUATE

## 2023-08-27 LAB — CULTURE, BLOOD (ROUTINE X 2)
Culture  Setup Time: NO GROWTH
Special Requests: ADEQUATE
